# Patient Record
Sex: Male | Born: 1945 | Marital: Married | Smoking: Never smoker
Health system: Northeastern US, Academic
[De-identification: ages and names within clinical notes are randomized; demographics above are authoritative.]

## PROBLEM LIST (undated history)

## (undated) DIAGNOSIS — N400 Enlarged prostate without lower urinary tract symptoms: Secondary | ICD-10-CM

## (undated) DIAGNOSIS — M7580 Other shoulder lesions, unspecified shoulder: Secondary | ICD-10-CM

## (undated) DIAGNOSIS — K219 Gastro-esophageal reflux disease without esophagitis: Secondary | ICD-10-CM

## (undated) DIAGNOSIS — R634 Abnormal weight loss: Secondary | ICD-10-CM

## (undated) DIAGNOSIS — E162 Hypoglycemia, unspecified: Secondary | ICD-10-CM

## (undated) DIAGNOSIS — E785 Hyperlipidemia, unspecified: Secondary | ICD-10-CM

## (undated) DIAGNOSIS — R37 Sexual dysfunction, unspecified: Secondary | ICD-10-CM

## (undated) DIAGNOSIS — I10 Essential (primary) hypertension: Secondary | ICD-10-CM

## (undated) DIAGNOSIS — E119 Type 2 diabetes mellitus without complications: Secondary | ICD-10-CM

## (undated) HISTORY — DX: Abnormal weight loss: R63.4

## (undated) HISTORY — PX: LEG TENDON SURGERY: SHX1004

## (undated) HISTORY — PX: KNEE SURGERY: SHX244

## (undated) HISTORY — DX: Type 2 diabetes mellitus without complications: E11.9

---

## 2003-11-06 DIAGNOSIS — I10 Essential (primary) hypertension: Secondary | ICD-10-CM | POA: Insufficient documentation

## 2003-11-06 DIAGNOSIS — E785 Hyperlipidemia, unspecified: Secondary | ICD-10-CM | POA: Insufficient documentation

## 2003-11-06 DIAGNOSIS — K219 Gastro-esophageal reflux disease without esophagitis: Secondary | ICD-10-CM | POA: Insufficient documentation

## 2003-11-06 DIAGNOSIS — E162 Hypoglycemia, unspecified: Secondary | ICD-10-CM | POA: Insufficient documentation

## 2004-01-24 DIAGNOSIS — D179 Benign lipomatous neoplasm, unspecified: Secondary | ICD-10-CM | POA: Insufficient documentation

## 2004-02-13 LAB — HM COLONOSCOPY

## 2005-04-16 DIAGNOSIS — M719 Bursopathy, unspecified: Secondary | ICD-10-CM | POA: Insufficient documentation

## 2005-04-16 DIAGNOSIS — M67919 Unspecified disorder of synovium and tendon, unspecified shoulder: Secondary | ICD-10-CM | POA: Insufficient documentation

## 2005-04-16 DIAGNOSIS — N400 Enlarged prostate without lower urinary tract symptoms: Secondary | ICD-10-CM | POA: Insufficient documentation

## 2005-06-25 ENCOUNTER — Encounter: Payer: Self-pay | Admitting: Cardiology

## 2005-06-25 DIAGNOSIS — F529 Unspecified sexual dysfunction not due to a substance or known physiological condition: Secondary | ICD-10-CM | POA: Insufficient documentation

## 2006-12-03 ENCOUNTER — Encounter: Payer: Self-pay | Admitting: Cardiology

## 2009-06-11 ENCOUNTER — Ambulatory Visit: Payer: Self-pay | Admitting: Ophthalmology

## 2009-08-20 ENCOUNTER — Observation Stay
Admission: EM | Admit: 2009-08-20 | Disposition: A | Discharge status: Home / Self Care | Payer: Self-pay | Source: Ambulatory Visit | Attending: Emergency Medicine | Admitting: Emergency Medicine

## 2009-08-20 ENCOUNTER — Encounter: Payer: Self-pay | Admitting: Gastroenterology

## 2009-08-20 ENCOUNTER — Encounter: Payer: Self-pay | Admitting: Emergency Medicine

## 2009-08-20 ENCOUNTER — Other Ambulatory Visit: Payer: Self-pay | Admitting: Gastroenterology

## 2009-08-20 ENCOUNTER — Ambulatory Visit: Payer: Self-pay | Admitting: Internal Medicine

## 2009-08-20 HISTORY — DX: Gastro-esophageal reflux disease without esophagitis: K21.9

## 2009-08-20 HISTORY — DX: Hypoglycemia, unspecified: E16.2

## 2009-08-20 HISTORY — DX: Essential (primary) hypertension: I10

## 2009-08-20 HISTORY — DX: Sexual dysfunction, unspecified: R37

## 2009-08-20 HISTORY — DX: Other shoulder lesions, unspecified shoulder: M75.80

## 2009-08-20 HISTORY — DX: Benign prostatic hyperplasia without lower urinary tract symptoms: N40.0

## 2009-08-20 HISTORY — DX: Hyperlipidemia, unspecified: E78.5

## 2009-08-20 LAB — CBC AND DIFFERENTIAL
Baso # K/uL: 0.1 THOU/uL (ref 0.0–0.1)
Basophil %: 0.8 % (ref 0.2–1.2)
Eos # K/uL: 0.4 THOU/uL (ref 0.0–0.5)
Eosinophil %: 4.5 % (ref 0.8–7.0)
Hematocrit: 41 % (ref 40–51)
Hemoglobin: 13.5 g/dL — ABNORMAL LOW (ref 13.7–17.5)
Lymph # K/uL: 3 THOU/uL (ref 1.3–3.6)
Lymphocyte %: 31.1 % (ref 21.8–53.1)
MCV: 84 fL (ref 79–92)
Mono # K/uL: 0.8 THOU/uL (ref 0.3–0.8)
Monocyte %: 7.9 % (ref 5.3–12.2)
Neut # K/uL: 5.3 THOU/uL (ref 1.8–5.4)
Platelets: 222 THOU/uL (ref 150–330)
RBC: 4.9 MIL/uL (ref 4.6–6.1)
RDW: 14.5 % — ABNORMAL HIGH (ref 11.6–14.4)
Seg Neut %: 55.7 % (ref 34.0–67.9)
WBC: 9.5 THOU/uL — ABNORMAL HIGH (ref 4.2–9.1)

## 2009-08-20 LAB — URINALYSIS WITH REFLEX TO MICROSCOPIC
Blood,UA: NEGATIVE
Ketones, UA: NEGATIVE
Leuk Esterase,UA: NEGATIVE
Nitrite,UA: NEGATIVE
Protein,UA: NEGATIVE mg/dL
Specific Gravity,UA: 1.008 (ref 1.002–1.030)
pH,UA: 7.5 (ref 5.0–8.0)

## 2009-08-20 LAB — TROPONIN T
Troponin T: <0.01 ng/mL (ref 0.00–0.02)
Troponin T: <0.01 ng/mL (ref 0.00–0.02)

## 2009-08-20 LAB — PLASMA PROF 7 (ED ONLY)
Anion Gap,PL: 8 (ref 7–16)
CO2,Plasma: 28 mmol/L (ref 20–28)
Chloride,Plasma: 107 mmol/L (ref 96–108)
Creatinine: 1.17 mg/dL (ref 0.67–1.17)
GFR,Black: >59 *
GFR,Caucasian: >59 *
Glucose,Plasma: 86 mg/dL (ref 74–106)
Potassium,Plasma: 3.4 mmol/L (ref 3.4–4.7)
Sodium,Plasma: 143 mmol/L (ref 132–146)
UN,Plasma: 14 mg/dL (ref 6–20)

## 2009-08-20 LAB — CK ISOENZYMES
CK: 253 U/L — ABNORMAL HIGH (ref 46–171)
Mass CKMB: 3.5 ng/mL (ref 0.0–4.9)
Relative Index: 1.4 % (ref 0.0–5.0)

## 2009-08-20 MED ORDER — HYDRALAZINE HCL 20 MG/ML IJ SOLN *I*
10.0000 mg | Freq: Once | INTRAMUSCULAR | Status: AC
Start: 2009-08-20 — End: 2009-08-20
  Administered 2009-08-20: 10 mg via INTRAVENOUS
  Filled 2009-08-20: qty 1

## 2009-08-20 MED ORDER — CHOLECALCIFEROL 1000 UNIT PO CAPS *WRAPPED*
1000.0000 [IU] | ORAL_CAPSULE | Freq: Every day | ORAL | Status: DC
Start: 2009-08-21 — End: 2009-08-21
  Administered 2009-08-21: 1000 [IU] via ORAL
  Filled 2009-08-20: qty 1

## 2009-08-20 MED ORDER — TAMSULOSIN HCL 0.4 MG PO CAPS *I*
0.4000 mg | ORAL_CAPSULE | Freq: Every evening | ORAL | Status: DC
Start: 2009-08-20 — End: 2009-08-21
  Administered 2009-08-21: 0.4 mg via ORAL
  Filled 2009-08-20: qty 1

## 2009-08-20 MED ORDER — HYDROCHLOROTHIAZIDE 25 MG PO TABS *I*
12.5000 mg | ORAL_TABLET | Freq: Once | ORAL | Status: AC
Start: 2009-08-20 — End: 2009-08-21
  Administered 2009-08-21: 12.5 mg via ORAL
  Filled 2009-08-20: qty 1

## 2009-08-20 MED ORDER — SODIUM CHLORIDE 0.9 % INJ (FLUSH) WRAPPED *I*
3.0000 mL | Freq: Three times a day (TID) | Status: DC
Start: 2009-08-20 — End: 2009-08-21
  Administered 2009-08-20 – 2009-08-21 (×2): 3 mL via INTRAVENOUS

## 2009-08-20 MED ORDER — RANITIDINE HCL 150 MG PO TABS *I*
150.0000 mg | ORAL_TABLET | Freq: Two times a day (BID) | ORAL | Status: DC
Start: 2009-08-21 — End: 2009-08-21
  Administered 2009-08-21: 150 mg via ORAL
  Filled 2009-08-20 (×2): qty 1

## 2009-08-20 MED ORDER — LISINOPRIL 10 MG PO TABS *I*
10.0000 mg | ORAL_TABLET | Freq: Once | ORAL | Status: AC
Start: 2009-08-20 — End: 2009-08-21
  Administered 2009-08-21: 10 mg via ORAL
  Filled 2009-08-20: qty 1

## 2009-08-20 MED ORDER — ATORVASTATIN CALCIUM 10 MG PO TABS *I*
20.0000 mg | ORAL_TABLET | Freq: Every day | ORAL | Status: DC
Start: 2009-08-21 — End: 2009-08-21
  Administered 2009-08-21: 20 mg via ORAL
  Filled 2009-08-20: qty 2

## 2009-08-20 MED ORDER — LISINOPRIL 10 MG PO TABS *I*
10.0000 mg | ORAL_TABLET | Freq: Every day | ORAL | Status: DC
Start: 2009-08-21 — End: 2009-08-21
  Filled 2009-08-20: qty 1

## 2009-08-20 MED ORDER — ACETAMINOPHEN 325 MG PO TABS *I*
650.0000 mg | ORAL_TABLET | ORAL | Status: DC | PRN
Start: 2009-08-20 — End: 2009-08-21
  Administered 2009-08-21: 650 mg via ORAL
  Filled 2009-08-20: qty 2

## 2009-08-20 MED ORDER — HYDROCHLOROTHIAZIDE 25 MG PO TABS *I*
12.5000 mg | ORAL_TABLET | Freq: Every day | ORAL | Status: DC
Start: 2009-08-21 — End: 2009-08-21
  Filled 2009-08-20: qty 1

## 2009-08-20 MED ORDER — HYDRALAZINE HCL 20 MG/ML IJ SOLN *I*
INTRAMUSCULAR | Status: DC
Start: 2009-08-20 — End: 2009-08-21
  Filled 2009-08-20: qty 1

## 2009-08-20 MED ORDER — MORPHINE SULFATE 2 MG/ML IJ SOLN
2.0000 mg | Freq: Once | INTRAMUSCULAR | Status: AC
Start: 2009-08-20 — End: 2009-08-21
  Filled 2009-08-20: qty 1

## 2009-08-20 MED ORDER — HYDRALAZINE HCL 20 MG/ML IJ SOLN *I*
10.0000 mg | Freq: Once | INTRAMUSCULAR | Status: AC
Start: 2009-08-20 — End: 2009-08-20
  Administered 2009-08-20: 10 mg via INTRAVENOUS

## 2009-08-20 MED ORDER — ASPIRIN 81 MG PO CHEW *I*
324.0000 mg | CHEWABLE_TABLET | Freq: Once | ORAL | Status: AC
Start: 2009-08-20 — End: 2009-08-20
  Administered 2009-08-20: 324 mg via ORAL
  Filled 2009-08-20: qty 4

## 2009-08-20 NOTE — ED Notes (Signed)
Bed:PA-01<BR> Expected date:08/20/09<BR> Expected time: 4:41 PM<BR> Means of arrival:Hospital Transport<BR> Comments:<BR> ADULT SERVICE---  Tristan Mcbride---  MR [id number]---  [facility]---PT WITH &quot;SQUEEZING&quot; CHEST PAIN BP 190/110, SOB--EKG WITHOUT CHANGES--HAS HAD NO MEDS X 42YR---BP, CHOLESTEROL---PM

## 2009-08-20 NOTE — Progress Notes (Signed)
Reason For Visit   ZO:[id number]     MEDICATIONS/CHART REVIEWEDpatient is not taking any medications at this time     SUBJECTIVE:patient speaks only [place].  Interpreter phone and Spanish   interpreter used to conduct interview, exam and plan. patient states that   he has not taken any of his medications for approximately one year because   he does not have insurance to pay for them.  He has been experiencing   intermittent chest squeezing and shortness of breath that can last for   hours.  It can happen with exertion or at rest.  He denies any diaphoresis   or nausea.  He states that right now he is feeling like someone is   squeezing his chest and he is slightly short of breath.  when he   experiences these symptoms he cannot describe anything that relieves them   they go away on their own. patient denies cigarette smoking.     OBJECTIVE:BP 190/110  GENERAL:  AandOX3  LUNGS:  CTA  HEART:  RRR, normal S1, S2 no murmur  LE: no edema  EKG: no acute changes, reviewed, discussed and plan agreed upon with Dr.   Peter Minium     ASSESSMENT:hypertension, chest pain      PLAN:  1.  Lipitor 20 mg one tablet daily  2.  Ranitidine 150 mg one tablet twice daily  3.  To [facility]  ED for evaluation.  Active Problems   Benign Prostatic Hyperplasia (600.00)  CT Abd. 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  Current Meds   Ranitidine HCl 150 MG Tablet;TAKE 1 TABLET EVERY 12 HOURS DAILY.; Rx  Vitamin D 1000 UNIT Capsule;TAKE 1 CAPSULE DAILY; Rx  Lisinopril-Hydrochlorothiazide 10-12.5 MG Tablet;TAKE 1 TABLET DAILY.; Rx  Viagra 25 MG Tablet;TAKE 1 TABLET DAILY 1 HOUR BEFORE NEEDED; Rx  Tamsulosin HCl 0.4 MG Capsule;TAKE 1  CAPSULE DAILY; Rx  Lipitor 20 MG Tablet;TAKE 1 TABLET DAILY AT BEDTIME.; Rx.  Allergies   Latex-asked/denied  Tetanus Toxoid Adsorbed SOLN.  Latex Screen   --Patient denies an allergy to latex or rubber products.  --Patient denies a reaction such as hives, swelling, or difficulty   breathing after contact with rubber products.  --Patient denies that they are currently on latex precautions.  **If yes to any of the above, institute latex precautions.**.  Vital Signs   Recorded by jfarry on 20 Aug 2009 03:52 PM  Temp: 36.6 C,   Height: 67.75 in, Weight: 72 kg, BMI: 24.3 kg/m2,   Pain Scale: 0.  Health Mgmt Plan   Colonoscopy every 5 years; for HEALTH MAINTENANCE; Overdue  Complete Blood Count every 1 year; for HEALTH MAINTENANCE; Overdue  Influenza Vaccine every 1 year; for HEALTH MAINTENANCE  Lipid Profile every 1 year; for HEALTH MAINTENANCE; Overdue  Pneumococcal Polyvalent Vaccine (Pneumovax) every 5 years; for HEALTH   MAINTENANCE  Prostate Specific Antigen every 1 year; for HEALTH MAINTENANCE; Overdue  Td Immunization every 7 years; for HEALTH MAINTENANCE.  Results   Blood Pressure   20 Aug 2009 04:13 PM  -   Systolic: 190 mm Hg  -   Diastolic: 110 mm Hg.  Pulse   20 Aug 2009 04:13 PM  -   Heart Rate: 62 bpm.  Signature   Electronically signed by: Ernst Breach  N.P.; 08/20/2009 4:41 PM EST.

## 2009-08-20 NOTE — ED Obs Notes (Signed)
History   Chief Complaint   Patient presents with   . Chest Pain       HPI Comments: [name] is a 64 y/o male with PMHx HTN, hyperlipidemia, GERD, BPH, hypoglycemia is placed in OBS1 after being evaluated in the ED with chest pain. Pt states he began having transient right sided squeezing chest pain one month ago. Pt states the pain comes on randomly and is not associated with anything. Pt states he occasionally feels short of breath along with the chest pain, but denies any diaphoresis, radiation of pain, nausea, or vomiting. The pain usually last for 1 hour and randomly resolves. Pt was seeing his doctor today, he had chest pain was short of breath and had elevated BP, so he was instructed to come to the ED for further evaluation. Pt is currently chest pain free with no shortness of breath. Pt admits to being prescribed medicines but not taking them for one year due to lac of insurance. Pt denies fevers, chills, nausea, vomiting, diarrhea, LOC, numbness or tingling.    The history is provided by the patient. The history is limited by a language barrier. No language interpreter was used.       Past Medical History   Diagnosis Date   . BPH (benign prostatic hypertrophy)    . Kidney cyst    . Esophageal reflux    . Hypertension    . Hyperlipidemia    . Hypoglycemia    . Fibrolipoma      intramuscular adipose tissue   . Rotator cuff tendinitis      Right   . Sexual dysfunction      absent ejaculation         Past Surgical History   Procedure Date   . Leg tendon surgery      Right   . Knee surgery      left         Family History   Problem Relation Age of Onset   . Heart disease Mother    . Diabetes Mother    . Diabetes Father    . Stroke Father           reports that he has never smoked. He does not have any smokeless tobacco history on file.  He reports that he does not currently drink alcohol or use illicit drugs.    Review of Systems   Review of Systems   Constitutional: Negative for fever, chills and  diaphoresis.   HENT: Negative for sore throat, trouble swallowing, neck pain and neck stiffness.    Eyes: Negative for photophobia, pain and visual disturbance.   Respiratory: Positive for chest tightness and shortness of breath.    Cardiovascular: Positive for chest pain. Negative for palpitations and leg swelling.   Gastrointestinal: Negative for nausea, vomiting, abdominal pain and diarrhea.   Genitourinary: Positive for decreased urine volume and difficulty urinating. Negative for dysuria.   Musculoskeletal: Negative for myalgias, arthralgias and gait problem.   Skin: Negative for color change and wound.   Neurological: Positive for light-headedness. Negative for dizziness, seizures, syncope, weakness and numbness.   Psychiatric/Behavioral: Negative for behavioral problems, confusion, decreased concentration and agitation.       Physical Exam   BP 190/96  Pulse 76  Temp(Src) 36.8 C (98.2 F) (Oral)  Resp 18  SpO2 97%    Physical Exam   Constitutional: He is oriented to person, place, and time. He appears well-developed and well-nourished. No distress.  HENT:   Head: Normocephalic and atraumatic.   Eyes: Conjunctivae and EOM are normal. Right eye exhibits no discharge. Left eye exhibits no discharge. No scleral icterus.   Neck: Normal range of motion. Neck supple. No JVD present. No tracheal deviation present.   Cardiovascular: Normal rate, regular rhythm, normal heart sounds and intact distal pulses.    Pulmonary/Chest: Effort normal and breath sounds normal. No stridor. No respiratory distress. He has no wheezes. He has no rales. He exhibits no tenderness.   Abdominal: Soft. Bowel sounds are normal. He exhibits no distension. No tenderness.   Musculoskeletal: Normal range of motion. He exhibits no edema and no tenderness.   Lymphadenopathy:     He has no cervical adenopathy.   Neurological: He is alert and oriented to person, place, and time.   Skin: Skin is warm and dry. No rash noted. He is not  diaphoretic. No erythema.   Psychiatric: He has a normal mood and affect. His behavior is normal.       Medical Decision Making   MDM  Number of Diagnoses or Management Options  Diagnosis management comments: Patient seen by me on 08/20/2009 at 11:59 PM.    Assessment:  64 y.o., male with PMHx HTN, hyperlipidemia, hypoglycemia, GERD, BPH placed in OBS after evaluation in the ED for chest pain.  Differential Diagnosis includes ACS, unstable angina, GERD, musculoskeletal.  Plan: serial trops, labs in AM, pain management prn, tele monitoring, stress test most likely as an outpatient.              Gabriel Cirri, PA

## 2009-08-20 NOTE — ED Provider Notes (Addendum)
History   Chief Complaint   Patient presents with   . Chest Pain       Patient is a 64 y.o. male presenting with chest pain. The history is provided by the spouse, the patient and medical records. The history is limited by a language barrier (Pt speaks primarily spanish, but he converses well in [place] with slow questions). No language interpreter was used.   Chest Pain  The chest pain began more than 2 weeks ago (pt began to have intermittent squeezing chest pain about one month ago). Duration of episode(s) is 1 hour. Chest pain occurs intermittently. The chest pain is unchanged. Associated With: Associated with nothing, he says it is random. At its most intense, the pain is at 10/10. The pain is currently at 2/10. The severity of the pain is mild. The quality of the pain is described as squeezing. The pain does not radiate. Additional symptoms include shortness of breath and dizziness. Additional symptoms include no fever, no fatigue, no cough, no palpitations, no syncope, no abdominal pain, no nausea and no vomiting.   Dizziness does not occur with nausea, vomiting, weakness or diaphoresis.   Additional symptoms include near-syncope. Additional symptoms do not include diaphoresis, numbness, lower extremity edema or weakness. He tried antacids and proton pump inhibitors for the symptoms.   Risk factors for ischemic cardiac disease include hypertension, dyslipidemia, being male and a family history of coronary artery disease. Past medical history is positive for hypertension. Past medical history is negative for diabetes, past myocardial infarction or congestive heart failure. Procedure history is negative for cardiac catheterization, echocardiogram, stress echo or exercise treadmill test.       Past Medical History   Diagnosis Date   . BPH (benign prostatic hypertrophy)    . Kidney cyst    . Esophageal reflux    . Hypertension    . Hyperlipidemia    . Hypoglycemia    . Fibrolipoma      intramuscular adipose  tissue   . Rotator cuff tendinitis      Right   . Sexual dysfunction      absent ejaculation         Past Surgical History   Procedure Date   . Leg tendon surgery      Right   . Knee surgery      left         Family History   Problem Relation Age of Onset   . Heart disease Mother    . Diabetes Mother    . Diabetes Father    . Stroke Father           reports that he has never smoked. He does not have any smokeless tobacco history on file.  He reports that he does not currently drink alcohol or use illicit drugs.    Review of Systems   Review of Systems   Constitutional: Negative for fever, diaphoresis and fatigue.   HENT: Negative for congestion, neck pain and neck stiffness.    Eyes: Negative for visual disturbance.   Respiratory: Positive for shortness of breath. Negative for cough.    Cardiovascular: Positive for chest pain and near-syncope. Negative for palpitations and syncope.   Gastrointestinal: Negative for nausea, vomiting and abdominal pain.   Genitourinary: Negative for hematuria and flank pain.   Musculoskeletal: Negative for myalgias and arthralgias.   Skin: Negative for pallor and rash.   Neurological: Positive for dizziness. Negative for syncope, weakness and numbness.   Hematological:  Negative for adenopathy.   Psychiatric/Behavioral: Negative for confusion. The patient is not nervous/anxious.        Physical Exam   BP 198/115  Pulse 64  Temp(Src) 36.4 C (97.5 F) (Temporal)  Resp 15  SpO2 100%    Physical Exam   Nursing note and vitals reviewed.  Constitutional: He is oriented to person, place, and time. He appears well-developed. No distress.   HENT:   Head: Normocephalic and atraumatic.   Mouth/Throat: Oropharynx is clear and moist.   Eyes: Conjunctivae and EOM are normal. Pupils are equal, round, and reactive to light.   Neck: Normal range of motion. Neck supple. No JVD present.   Cardiovascular: Normal rate, regular rhythm and normal heart sounds.    No murmur heard.  Pulmonary/Chest: Effort  normal and breath sounds normal. No respiratory distress. He has no wheezes. He has no rales.   Abdominal: Soft. Bowel sounds are normal. He exhibits no distension. No tenderness. He has no guarding.   Genitourinary:        No CVA tenderness b/l   Musculoskeletal: Normal range of motion. He exhibits no edema and no tenderness.   Lymphadenopathy:     He has no cervical adenopathy.   Neurological: He is alert and oriented to person, place, and time. No cranial nerve deficit. He exhibits normal muscle tone. Coordination normal.   Skin: Skin is warm and dry. No rash noted. He is not diaphoretic. No erythema. No pallor.   Psychiatric: He has a normal mood and affect. His behavior is normal. Judgment and thought content normal.       Medical Decision Making   MDM  Number of Diagnoses or Management Options  Diagnosis management comments: Patient seen by me today, 08/20/2009 at 6:26 PM    Assessment:  64 y.o., male comes to the ED with chest pain, intermittent at random, squeezing, for past month.  Pt is followed for HTN by his doctor.  FamHx, age, gender, HTN, hyperlipids all concerning for ACS, or unstable angina.  Pt also with h/o GERD.  Differential Diagnosis includes ACS, unstable angina, GERD, stable angina, pericarditis.  Plan: EKG, CXR, CBC, ED-7, trop, telemetry, analgesia; pt likely to be CP R/O pt overnight, if no acute process discovered on workup.  Pt should have a stress test soon.             Amount and/or Complexity of Data Reviewed  Clinical lab tests: ordered  Tests in the radiology section of CPT: ordered  Decide to obtain previous medical records or to obtain history from someone other than the patient: yes  Obtain history from someone other than the patient: yes  Review and summarize past medical records: yes  Discuss the patient with other providers: yes  Independent visualization of images, tracings, or specimens: yes          Erasmo Leventhal, MD    Patient seen by me on arrival date of 08/20/2009 at  aprox 18:45    History:   I reviewed this patient, reviewed the resident note and agree, with corrections as below  Exam:   I examined this patient, reviewed the resident note and agree, with corrections as below    Decision Making:   I discussed with the documented resident decision making  and agree, with corrections as below    64 y.o. male who went in to PCP office today b/c out of htn med for some time and has been having intermittent retrosternal chest "tightness" without  radiation, occ associated with SOB, no associated nausea, vomiting, fever or chills.  Pt does not report association with activity.  Hx of same for many months.  Mentioned to PCP today and sent in.  Pt physical exam is unrevealing.  Will control htn, treat pain and admit to obs for full r/o and provocative testing.          Author Omarr Hann A Darrol Poke, MD  08/21/09 [phone]

## 2009-08-20 NOTE — ED Notes (Signed)
Sent form ac5.  C/O chest pain with elevated BP.  170/110.  Has not been on medications.  Hx  htn

## 2009-08-21 LAB — CBC AND DIFFERENTIAL
Baso # K/uL: 0.1 THOU/uL (ref 0.0–0.1)
Basophil %: 0.5 % (ref 0.2–1.2)
Eos # K/uL: 0.4 THOU/uL (ref 0.0–0.5)
Eosinophil %: 2.4 % (ref 0.8–7.0)
Hematocrit: 42 % (ref 40–51)
Hemoglobin: 14 g/dL (ref 13.7–17.5)
Lymph # K/uL: 2.8 THOU/uL (ref 1.3–3.6)
Lymphocyte %: 18.6 % — ABNORMAL LOW (ref 21.8–53.1)
MCV: 83 fL (ref 79–92)
Mono # K/uL: 0.9 THOU/uL — ABNORMAL HIGH (ref 0.3–0.8)
Monocyte %: 6.1 % (ref 5.3–12.2)
Neut # K/uL: 10.7 THOU/uL — ABNORMAL HIGH (ref 1.8–5.4)
Platelets: 208 THOU/uL (ref 150–330)
RBC: 5.1 MIL/uL (ref 4.6–6.1)
RDW: 14.7 % — ABNORMAL HIGH (ref 11.6–14.4)
Seg Neut %: 72.4 % — ABNORMAL HIGH (ref 34.0–67.9)
WBC: 14.8 THOU/uL — ABNORMAL HIGH (ref 4.2–9.1)

## 2009-08-21 LAB — TROPONIN T: Troponin T: <0.01 ng/mL (ref 0.00–0.02)

## 2009-08-21 LAB — BASIC METABOLIC PANEL
Anion Gap: 7 (ref 7–16)
CO2: 26 mmol/L (ref 20–28)
Calcium: 9.1 mg/dL (ref 8.6–10.2)
Chloride: 107 mmol/L (ref 96–108)
Creatinine: 1.2 mg/dL — ABNORMAL HIGH (ref 0.67–1.17)
GFR,Black: >59 *
GFR,Caucasian: >59 *
Glucose: 160 mg/dL — ABNORMAL HIGH (ref 74–106)
Lab: 15 mg/dL (ref 6–20)
Potassium: 3.6 mmol/L (ref 3.3–5.1)
Sodium: 140 mmol/L (ref 133–145)

## 2009-08-21 LAB — POCT GLUCOSE: Glucose POCT: 140 mg/dL — ABNORMAL HIGH (ref 74–106)

## 2009-08-21 MED ORDER — MOXIFLOXACIN HCL 400 MG PO TABS *I*
400.0000 mg | ORAL_TABLET | Freq: Every day | ORAL | Status: AC
Start: 2009-08-21 — End: 2009-08-31

## 2009-08-21 MED ORDER — MOXIFLOXACIN HCL 400 MG PO TABS *I*
400.0000 mg | ORAL_TABLET | Freq: Every day | ORAL | Status: DC
Start: 2009-08-21 — End: 2009-08-21
  Filled 2009-08-21: qty 1

## 2009-08-21 MED ORDER — HYDROCHLOROTHIAZIDE 12.5 MG PO TABS *I*
12.5000 mg | ORAL_TABLET | Freq: Every day | ORAL | Status: AC
Start: 2009-08-21 — End: 2009-09-20

## 2009-08-21 MED ORDER — LABETALOL HCL 20 MG/4 ML IV SOLN WRAPPED *I*
10.0000 mg | Freq: Once | INTRAVENOUS | Status: AC
Start: 2009-08-21 — End: 2009-08-21
  Administered 2009-08-21: 10 mg via INTRAVENOUS
  Filled 2009-08-21 (×2): qty 2

## 2009-08-21 MED ORDER — SODIUM CHLORIDE 0.9 % IV BOLUS *I*
1000.0000 mL | Freq: Once | Status: AC
Start: 2009-08-21 — End: 2009-08-21
  Administered 2009-08-21: 1000 mL via INTRAVENOUS

## 2009-08-21 MED ORDER — LISINOPRIL 10 MG PO TABS *I*
10.0000 mg | ORAL_TABLET | Freq: Every day | ORAL | Status: AC
Start: 2009-08-21 — End: 2009-09-20

## 2009-08-21 MED ORDER — SODIUM CHLORIDE 0.9 % IV SOLN WRAPPED *I*
100.0000 mL/h | Status: DC
Start: 2009-08-21 — End: 2009-08-21
  Administered 2009-08-21 (×2): 100 mL/h via INTRAVENOUS

## 2009-08-21 MED ORDER — HEPARIN SODIUM 5000 UNIT/ML SQ *I*
5000.0000 [IU] | Freq: Three times a day (TID) | SUBCUTANEOUS | Status: DC
Start: 2009-08-21 — End: 2009-08-21

## 2009-08-21 MED ORDER — MOXIFLOXACIN HCL IN NACL 400 MG/250ML IV SOLN *WRAPPED* *I*
400.0000 mg | Freq: Once | INTRAVENOUS | Status: AC
Start: 2009-08-21 — End: 2009-08-21
  Administered 2009-08-21: 400 mg via INTRAVENOUS
  Filled 2009-08-21: qty 250

## 2009-08-21 NOTE — ED Notes (Addendum)
Patient arrived to unit.  Patient stated that he wanted a translator whiclh was paged and is coming in.  Patient understands basic English.  Patient stated that he has a headache (pain at a 3) and tylenol was given.  Patient is resting at this time.

## 2009-08-21 NOTE — ED Notes (Signed)
Pt resting, denies pain 

## 2009-08-21 NOTE — ED Notes (Signed)
Patient is resting comfortably. 

## 2009-08-21 NOTE — Discharge Instructions (Signed)
Please follow-up with your PCP next week. 

## 2009-08-21 NOTE — Progress Notes (Signed)
Utilization Management    Level of Care Observation service as of the date 08/20/09      Ronica Vivian A Micahel Omlor     Pager: [phone]

## 2009-08-21 NOTE — ED Obs Notes (Signed)
Patient seen by me today, 08/21/2009 at 9:40 AM    Chief Complaint: Chief Complaint   Patient presents with   . Chest Pain       Subjective:  patient feels better. No current chest pain.  Nursing Pain Score:  Last Nursing documented pain:  0-10 Scale: 0 (08/21/09 0600)      Vitals: Patient Vitals in the past 24 hrs:   BP Temp Temp src Pulse Resp SpO2   08/21/09 0600 104/69 mmHg 36.2 C (97.2 F) TEMPORAL 84  16  98 %   08/21/09 0512 - 36.6 C (97.9 F) Tympanic - - -   08/21/09 0508 110/80 mmHg - - 78  - -   08/21/09 0450 84/58 mmHg - - 62  14  -   08/21/09 0336 140/100 mmHg - - - - -   08/21/09 0123 205/112 mmHg 37.2 C (99 F) TEMPORAL 87  18  97 %   08/21/09 0012 190/96 mmHg 36.8 C (98.2 F) Oral 76  18  97 %   08/21/09 0003 190/100 mmHg - - - - -   08/20/09 2315 178/100 mmHg 36.4 C (97.5 F) TEMPORAL 67  16  100 %   08/20/09 2134 198/115 mmHg - - 64  15  100 %   08/20/09 1926 180/87 mmHg - - 64  16  99 %   08/20/09 1755 225/106 mmHg 36.4 C (97.5 F) TEMPORAL 58  16  99 %   08/20/09 1716 190/110 mmHg 36.6 C (97.9 F) - 62  18  100 %         Physical Examination:  Physical Exam  Alert, oriented X 3 in NAD  CTA  RRR  Soft, nt, nd  No calf tenderness  Neuro is non-focal.    Lab Results: All labs in the last 72 hours   Recent Results (from the past 72 hour(s))   PLASMA PROF 7 [facility] ED ONLY)    Collection Time    08/20/09  5:47 PM   Component Value Range   . CHLORIDE,PLASMA 107  96-108 (mmol/L)   . CO2,PLASMA 28  20-28 (mmol/L)   . POTASSIUM,PLASMA 3.4  3.4-4.7 (mmol/L)   . SODIUM,PLASMA 143  132-146 (mmol/L)   . ANION GAP 8  7-16    . UREA NITROGEN,PLASMA 14  6-20 (mg/dL)   . CREATININE,PLASMA 1.17  0.67-1.17 (mg/dL)   . GFR,CAUCASIAN > 59  (*)   . GFR,BLACK > 59  (*)   . Glucose, Plasma 86  74-106 (mg/dL)   TROPONIN T    Collection Time    08/20/09  5:47 PM   Component Value Range   . Troponin T < 0.01  0.00-0.02 (ng/mL)   CK ISOENZYMES    Collection Time    08/20/09  5:47 PM   Component Value Range   . CK 253 (*)  46-171 (U/L)   . Mass CKMB 3.5  0.0-4.9 (ng/mL)   . Relative Index 1.4  0.0-5.0 (%)   CBC AND DIFFERENTIAL    Collection Time    08/20/09  5:47 PM   Component Value Range   . WBC 9.5 (*) 4.2-9.1 (THOU/uL)   . RBC 4.9  4.6-6.1 (MIL/uL)   . Hemoglobin 13.5 (*) 13.7-17.5 (g/dL)   . Hematocrit 41  40-51 (%)   . MCV 84  79-92 (fL)   . RDW 14.5 (*) 11.6-14.4 (%)   . Platelets 222  150-330 (THOU/uL)   . Seg Neut % 55.7  34.0-67.9 (%)   .  Lymphocyte % 31.1  21.8-53.1 (%)   . Monocyte % 7.9  5.3-12.2 (%)   . Eosinophil % 4.5  0.8-7.0 (%)   . Basophil % 0.8  0.2-1.2 (%)   . Neut # K/uL 5.3  1.8-5.4 (THOU/uL)   . Lymph # K/uL 3.0  1.3-3.6 (THOU/uL)   . Mono # K/uL 0.8  0.3-0.8 (THOU/uL)   . Eos # K/uL 0.4  0.0-0.5 (THOU/uL)   . Baso # K/uL 0.1  0.0-0.1 (THOU/uL)   URINALYSIS WITH REFLEX TO MICROSCOPIC    Collection Time    08/20/09  8:56 PM   Component Value Range   . Color, UA Lt Yellow  Yellow    . APPEARANCE,UR Clear  Clear    . Specific Gravity, UA 1.008  1.002-1.030    . Leuk Esterase,UA NEG  NEGATIVE    . Nitrite, UA NEG  NEGATIVE    . pH, Urine 7.5  5.0-8.0    . Protein, UA NEG  NEGATIVE (mg/dL)   . Glucose, Urine NORM  (mg/dL)   . Ketones, UA NEG  NEGATIVE    . Blood, UA NEG  NEGATIVE    TROPONIN T    Collection Time    08/20/09 11:28 PM   Component Value Range   . Troponin T < 0.01  0.00-0.02 (ng/mL)   POCT GLUCOSE    Collection Time    08/21/09  4:36 AM   Component Value Range   . Glucose, POC 140 (*) 74-106 (mg/dL)   TROPONIN T    Collection Time    08/21/09  4:56 AM   Component Value Range   . Troponin T < 0.01  0.00-0.02 (ng/mL)   CBC AND DIFFERENTIAL    Collection Time    08/21/09  4:56 AM   Component Value Range   . WBC 14.8 (*) 4.2-9.1 (THOU/uL)   . RBC 5.1  4.6-6.1 (MIL/uL)   . Hemoglobin 14.0  13.7-17.5 (g/dL)   . Hematocrit 42  40-51 (%)   . MCV 83  79-92 (fL)   . RDW 14.7 (*) 11.6-14.4 (%)   . Platelets 208  150-330 (THOU/uL)   . Seg Neut % 72.4 (*) 34.0-67.9 (%)   . Lymphocyte % 18.6 (*) 21.8-53.1 (%)   .  Monocyte % 6.1  5.3-12.2 (%)   . Eosinophil % 2.4  0.8-7.0 (%)   . Basophil % 0.5  0.2-1.2 (%)   . Neut # K/uL 10.7 (*) 1.8-5.4 (THOU/uL)   . Lymph # K/uL 2.8  1.3-3.6 (THOU/uL)   . Mono # K/uL 0.9 (*) 0.3-0.8 (THOU/uL)   . Eos # K/uL 0.4  0.0-0.5 (THOU/uL)   . Baso # K/uL 0.1  0.0-0.1 (THOU/uL)   BASIC METABOLIC PANEL    Collection Time    08/21/09  4:56 AM   Component Value Range   . Glucose 160 (*) 74-106 (mg/dL)   . Sodium 140  133-145 (mmol/L)   . Potassium 3.6  3.3-5.1 (mmol/L)   . Chloride 107  96-108 (mmol/L)   . CO2 26  20-28 (mmol/L)   . Anion Gap 7  7-16    . BUN 15  6-20 (mg/dL)   . Creatinine 1.20 (*) 0.67-1.17 (mg/dL)   . GFR MDRD Non Af Amer > 59  (*)   . GFR MDRD Af Amer > 59  (*)   . Calcium 9.1  8.6-10.2 (mg/dL)   BLOOD CULTURE    Collection Time    08/21/09  5:29 AM   Component  Value Range   . Blood Culture, Routine .     BLOOD CULTURE    Collection Time    08/21/09  6:22 AM   Component Value Range   . Blood Culture, Routine .               Imaging findings: Chest Frontal & Lat    08/20/2009  IMPRESSION: Slight retrocardiac density in the right lower lung  concerning for focal infiltrate.   Dr. Burna Cash was made aware of the above finding at 6:45 pm via  phone at 08/20/2009.         Assessment: This is a  64 years old with history of HTN, HLD, hypoglycemia, transferred from medicine clinic at [facility] secondary to chest pressure and hypertensive urgency due to medical non-compliance (pt had run out of medications).  This patient received two IV dosis of 10 mg of hydralazine and 10 mg of labetalol IV. He had an episode of hypotension this AM that responded to IVF. Feels better now.  Will hold off his HCTZ and lisinopril for now. I have d/c the IVF. Will continue to rule out for ACS. If his BP stabilizes by this afternoon  And he rules out, will d/c home to follow up with PCP next week.       Plan: as stated above.  Medically preferred DVT prophylaxis: Unfractionated Heparin TID    Author: Kinlie Janice   Note created: 08/21/2009  at: 9:39 AM

## 2009-08-21 NOTE — ED Notes (Signed)
At 0441, patient's HR dropped to 38.  Patient was sweating and stating that he was not feeling well.  Provider PM was present at the time and ordered a bolus of NS.  BP was 84/58 and BG was 140.  Patient is now resting at this time

## 2009-08-22 LAB — EKG 12-LEAD
P: 47 degrees
PR: 162 ms
QRS: 13 degrees
QRSD: 81 ms
QT: 401 ms
QTc: 417 ms
Rate: 65 {beats}/min
Severity: BORDERLINE
Statement: NORMAL
T: 35 degrees

## 2009-08-23 LAB — EKG 12-LEAD
P: 49 degrees
P: 66 degrees
PR: 172 ms
PR: 156 ms
QRS: -1 degrees
QRS: 17 degrees
QRSD: 84 ms
QRSD: 84 ms
QT: 460 ms
QT: 488 ms
QTc: 456 ms
QTc: 441 ms
Rate: 59 {beats}/min
Rate: 49 {beats}/min
Severity: ABNORMAL
Severity: ABNORMAL
T: 17 degrees
T: 37 degrees

## 2009-08-26 LAB — BLOOD CULTURE
Bacterial Blood Culture: 0
Bacterial Blood Culture: 0

## 2009-09-02 ENCOUNTER — Ambulatory Visit: Payer: Self-pay | Admitting: Internal Medicine

## 2009-09-02 NOTE — Progress Notes (Signed)
Reason For Visit   ZO:[id number] from ED from chest pain and hypertension     MEDICATIONS/CHART REVIEWEDpatient is taking only lisinopril at this time 10   mg daily     SUBJECTIVE:patient is here today for a followup visit after being seen in   the emergency department for chest pain and hypertension.  He states all of   his tests were negative.  He was discharged on lisinopril 10 mg one tablet   daily.  Patient states he is feeling well he denies any shortness of breath   or chest pain at this time.     OBJECTIVE:  GENERAL:  AandOX3  LUNGS:  CTA  HEART:  RRR   SKIN:      no rashes     ASSESSMENT:hypertension much better     PLAN:  1.  No change in medications at this time  2.  Office visit with PCP in 3 months  3.  Call if any problems when necessary.  Active Problems   Benign Prostatic Hyperplasia (600.00)  CT Abd. 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  Current Meds   Vitamin D 1000 UNIT Capsule;TAKE 1 CAPSULE DAILY; Rx  Tamsulosin HCl 0.4 MG Capsule;TAKE 1 CAPSULE DAILY; Rx  Lipitor 20 MG Tablet;TAKE 1 TABLET DAILY AT BEDTIME.; Rx  Ranitidine HCl 150 MG Tablet;TAKE 1 TABLET EVERY 12 HOURS DAILY.; Rx  Lisinopril-Hydrochlorothiazide 10-12.5 MG Tablet;TAKE 1 TABLET DAILY.; Rx  Viagra 25 MG Tablet;TAKE 1 TABLET DAILY 1 HOUR BEFORE NEEDED; Rx.  Allergies   Latex-asked/denied  Tetanus Toxoid Adsorbed SOLN.  Latex Screen   --Patient denies an allergy to latex or rubber products.  --Patient denies a reaction such as hives, swelling, or difficulty   breathing after contact with rubber products.  --Patient denies that they are currently on latex precautions.  **If yes to any of the above, institute latex  precautions.**.  Vital Signs   Recorded by ctorres on 02 Sep 2009 03:51 PM  Temp: 36.7 C,   Weight: 71.8 kg,   Pain Scale: 0.  Recorded by ctorres on 02 Sep 2009 03:52 PM  Height: 67.75 in.  Health Mgmt Plan   Colonoscopy every 5 years; for HEALTH MAINTENANCE; Overdue  Complete Blood Count every 1 year; for HEALTH MAINTENANCE; Overdue  Influenza Vaccine every 1 year; for HEALTH MAINTENANCE; Near Due  Lipid Profile every 1 year; for HEALTH MAINTENANCE; Overdue  Pneumococcal Polyvalent Vaccine (Pneumovax) every 5 years; for HEALTH   MAINTENANCE  Prostate Specific Antigen every 1 year; for HEALTH MAINTENANCE; Overdue  Td Immunization every 7 years; for HEALTH MAINTENANCE.  Signature   Electronically signed by: Ernst Breach  N.P.; 09/02/2009 5:34 PM EST.

## 2009-10-15 ENCOUNTER — Ambulatory Visit: Payer: Self-pay | Admitting: Internal Medicine

## 2009-10-17 ENCOUNTER — Ambulatory Visit
Admit: 2009-10-17 | Discharge: 2009-10-17 | Disposition: A | Discharge status: Home / Self Care | Payer: Self-pay | Source: Ambulatory Visit | Attending: Internal Medicine | Admitting: Internal Medicine

## 2009-10-17 LAB — CBC
Hematocrit: 43 % (ref 40–51)
Hemoglobin: 14.1 g/dL (ref 13.7–17.5)
MCV: 84 fL (ref 79–92)
Platelets: 277 THOU/uL (ref 150–330)
RBC: 5.2 MIL/uL (ref 4.6–6.1)
RDW: 14.3 % (ref 11.6–14.4)
WBC: 10 THOU/uL — ABNORMAL HIGH (ref 4.2–9.1)

## 2009-10-17 LAB — CK: CK: 142 U/L (ref 46–171)

## 2009-10-17 LAB — COMPREHENSIVE METABOLIC PANEL
ALT: 20 U/L (ref 0–50)
AST: 19 U/L (ref 0–50)
Albumin: 4.4 g/dL (ref 3.5–5.2)
Alk Phos: 82 U/L (ref 40–130)
Anion Gap: 10 (ref 7–16)
Bilirubin,Total: 0.3 mg/dL (ref 0.0–1.2)
CO2: 26 mmol/L (ref 20–28)
Calcium: 9.5 mg/dL (ref 8.6–10.2)
Chloride: 102 mmol/L (ref 96–108)
Creatinine: 1.32 mg/dL — ABNORMAL HIGH (ref 0.67–1.17)
GFR,Black: >59 *
GFR,Caucasian: 55 * — AB
Glucose: 125 mg/dL — ABNORMAL HIGH (ref 74–106)
Lab: 22 mg/dL — ABNORMAL HIGH (ref 6–20)
Potassium: 3.9 mmol/L (ref 3.3–5.1)
Sodium: 138 mmol/L (ref 133–145)
Total Protein: 7.2 g/dL (ref 6.3–7.7)

## 2009-10-17 LAB — LIPID PANEL
Chol/HDL Ratio: 3.9
Cholesterol: 213 mg/dL — AB
HDL: 55 mg/dL
LDL Calculated: 141 mg/dL
Non HDL Cholesterol: 158 mg/dL
Triglycerides: 83 mg/dL

## 2009-10-22 ENCOUNTER — Ambulatory Visit: Payer: Self-pay | Admitting: Internal Medicine

## 2009-10-22 NOTE — Progress Notes (Signed)
Reason For Visit   YQ:[id number], lab results      MEDICATIONS/CHART REVIEWED done at the time of visit     SUBJECTIVE:patient is here today for a followup blood pressure check.  He   is also requesting medication for acid reflux.  He was unable to count his   last prescription filled due to finances.  He denies any chest pain,   shortness of breath or lower extremity edema.     OBJECTIVE:  GENERAL:  AandOX3  LUNGS:  CTA  HEART:  RRR   ABD:  positive bowel sounds, soft, nontender, nondistended, no rebound, no   guarding  reviewed recent labs with patient     ASSESSMENT:hypertension, GERD, Hyperlipidemia     PLAN:  1.  No change in blood pressure medication at this time  2.  Omeprazole 20 mg one tablet daily  3.  Encouraged patient to follow low cholesterol/salt diet  4.  Office visit in 3 months for routine followup.  Active Problems   Benign Prostatic Hyperplasia (600.00)  CT Abd. 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  Current Meds   Lisinopril 10 MG Tablet;TAKE 1 TABLET DAILY.; Rx.  Allergies   Latex-asked/denied  Tetanus Toxoid Adsorbed SOLN.  Latex Screen   --Patient denies an allergy to latex or rubber products.  --Patient denies a reaction such as hives, swelling, or difficulty   breathing after contact with rubber products.  --Patient denies that they are currently on latex precautions.  **If yes to any of the above, institute latex precautions.**.  Vital Signs   Recorded by [email or web address] on 22 Oct 2009 03:59 PM  BP:115/80,   HR: 74 b/min,   Temp: 36.6 C,   Height: 67.75 in, Weight: 71.3 kg, BMI: 24.1 kg/m2,   Pain Scale: 0.  Health Mgmt Plan   Colonoscopy every 5 years; for HEALTH MAINTENANCE;  Overdue  Complete Blood Count every 1 year; for HEALTH MAINTENANCE; Overdue  Influenza Vaccine every 1 year; for HEALTH MAINTENANCE; Near Due  Lipid Profile every 1 year; for HEALTH MAINTENANCE; Overdue  Pneumococcal Polyvalent Vaccine (Pneumovax) every 5 years; for HEALTH   MAINTENANCE  Prostate Specific Antigen every 1 year; for HEALTH MAINTENANCE; Overdue  Td Immunization every 7 years; for HEALTH MAINTENANCE.  Signature   Electronically signed by: Ernst Breach  N.P.; 10/22/2009 4:36 PM EST.

## 2010-01-23 ENCOUNTER — Ambulatory Visit: Payer: Self-pay | Admitting: Internal Medicine

## 2010-01-25 NOTE — Progress Notes (Addendum)
 Reason For Visit   Difficulty urinating.  HPI   64 year old male with history of BPH.  He is here with complaint of   difficulty urinating.  He previously was without insurance but now has   insurance.     Patient stopped Flomax 0.8 mg each bedtime one year ago when his insurance   was discontinued and he could no longer afford this.  He did have an   elevated PSA to 4.6 and was referred to urology and had a needle biopsy   performed which showed no malignant cells.  He states that he would like to   continue to monitor for prostate cancer.     Rash: Noted to have rash on exam.  He states this has been present for   years.  It does not bother him.  Active Problems   Benign Prostatic Hyperplasia (600.00)  CT Abd. 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  Current Meds   Lisinopril 10 MG Tablet;TAKE 1 TABLET DAILY.; Rx  Omeprazole 20 MG Capsule Delayed Release;TAKE 1 CAPSULE DAILY.; Rx.  Allergies   Latex-asked/denied  Tetanus Toxoid Adsorbed SOLN.  Latex Screen   --Patient denies an allergy to latex or rubber products.  --Patient denies a reaction such as hives, swelling, or difficulty   breathing after contact with rubber products.  --Patient denies that they are currently on latex precautions.  **If yes to any of the above, institute latex precautions.**.  Vital Signs   Recorded by jfarry on 23 Jan 2010 04:09 PM  Temp: 36.4 C,   Height: 66.5 in, Weight: 71.8 kg, BMI: 25.2 kg/m2,   Pain Scale: 0.  Recorded by jfarry on 23 Jan 2010 04:11 PM  BP:142/86,   HR: 80 b/min.  Physical Exam   General -- alert, no acute distress  HEENT -- moist mucous membranes without oral lesions  cardiovascular -- regular rate  and rhythm without rubs murmurs or gallops  pulmonary -- clear to auscultation bilaterally  abdomen -- soft, nontender nondistended, no hepatosplenomegaly, positive   bowel sounds  extremities -- no calf swelling tenderness or edema.  Skin-multiple well-circumscribed macular hypopigmented scaly lesions on   neck.  Health Mgmt Plan   Colonoscopy every 5 years; for HEALTH MAINTENANCE; Overdue  Complete Blood Count every 1 year; for HEALTH MAINTENANCE; Overdue  Influenza Vaccine every 1 year; for HEALTH MAINTENANCE; Overdue  Lipid Profile every 1 year; for HEALTH MAINTENANCE; Overdue  Pneumococcal Polyvalent Vaccine (Pneumovax) every 5 years; for HEALTH   MAINTENANCE  Prostate Specific Antigen every 1 year; for HEALTH MAINTENANCE; Overdue  Td Immunization every 7 years; for HEALTH MAINTENANCE.  Assessment   64 year old gentleman with BPH, and continued versicolor.     BPH: Will restart Flomax.  We'll start at 0.4 mg each bedtime and titrate   up to 0.8 mg each bedtime after 2 weeks if patient tolerates this well.     Patient does also desire to continue to screen for prostate cancer.  We   will check a PSA today.     Tinea versicolor: We'll treat with fluconazole 200 mg daily for 2 weeks.     Preventative health maintenance: Flu shot given today.     ** Medication reconciliation completed and updated list printed  for the   patient. **     Health history update reviewed.     Patient expresses understanding and agreement with the above plan.  He will   followup in 6 months time or sooner if needed.  Attestation                   --Case discussed with resident at the time of the visit.  I agree with the   findings and plan of care as documented by the resident regarding BPH and   tinea versicolor.         [name], M.D., Attending physician, Internal Medicine   [facility]       01/25/2010.  Signature   Electronically signed by: Saddie Benders  MD - Res.; 01/23/2010 5:49 PM EST.  Electronically signed by:  [name]  M.D.; 01/25/2010 5:51 PM EST.

## 2010-02-01 ENCOUNTER — Ambulatory Visit
Admit: 2010-02-01 | Discharge: 2010-02-01 | Disposition: A | Discharge status: Home / Self Care | Payer: Self-pay | Source: Ambulatory Visit | Attending: Internal Medicine | Admitting: Internal Medicine

## 2010-02-01 LAB — PSA (EFF.4-2010): PSA (eff. 4-2010): 8.85 ng/mL — ABNORMAL HIGH (ref 0.00–4.00)

## 2010-04-20 NOTE — Miscellaneous (Unsigned)
 Continuity of Care Record  Created: todo  From: [name]  From:   From: [employer], EHR v10.2.7.53  To: Ryder, Kylo  Purpose: Patient Use;       Problems  Diagnosis: Benign Prostatic Hyperplasia (600.00)   Diagnosis: Benign Prostatic Hypertrophy With Urinary Obstruction (600.01)   Problem: CT Abd. 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 ; Rx   Omeprazole 20 MG Capsule Delayed Release; TAKE 1 CAPSULE DAILY. ; Rx   Tamsulosin HCl 0.4 MG Capsule; TAKE 1 CAPSULE BY MOUTH AT BEDTIME NIGHTLY ;   Rx   Triamcinolone Acetonide 0.1 % Ointment; Apply to affected areas twice a day   ; Rx     Immunizations  * Influenza   * Influenza   * Influenza   * Influenza

## 2010-04-24 ENCOUNTER — Ambulatory Visit: Payer: Self-pay | Admitting: Internal Medicine

## 2010-06-05 ENCOUNTER — Ambulatory Visit
Admit: 2010-06-05 | Discharge: 2010-06-05 | Disposition: A | Discharge status: Home / Self Care | Payer: Self-pay | Admitting: Urology

## 2010-06-05 ENCOUNTER — Encounter: Payer: Self-pay | Admitting: Urology

## 2010-06-05 DIAGNOSIS — R972 Elevated prostate specific antigen [PSA]: Secondary | ICD-10-CM | POA: Insufficient documentation

## 2010-06-05 LAB — PSA (EFF.4-2010): PSA (eff. 4-2010): 6.1 ng/mL — ABNORMAL HIGH (ref 0.00–4.00)

## 2010-06-05 NOTE — Progress Notes (Signed)
 Urology Clinic Note   Dear Dr. [name]:   [name] is a 65 year old male being seen in follow-up for the   following:elevated PSA.     This patient was recently noted to have elevated PSA to 8.85.  His last PSA   was 4.8 last year.  He does have history of BPH and elevated PSA.  He had   prostate biopsy in 2010.  There was no evidence of malignancy.  He was   given tamsulosin.  He discontinued this.  His voiding symptoms   deteriorated.  He was recently replaced back on tamsulosin.  He is voiding   much better.  He denied urinary tract infections.  He denies hematuria.     No other significant changes are noted in his health, review of systems or   medications.     Physical examination of the abdomen is benign. There is no evidence of a   mass.  He does not have CVA tenderness.  There is no evidence of   organomegaly.  I did not notice any lymphadenopathy.  There is no evidence   of suprapubic fullness or tenderness.  There was no evidence of hernia.    Genitalia reveals a normal phallus with normal testes.  Rectal examination   reveals a good anal tone.  Prostate is approximately 40-50g and smooth   without nodularity.  This is not suspicious for malignancy.      Assessment/plan: Elevated PSA.  His PSA will be repeated.  He may require   repeat biopsy.  He will continue tamsulosin as directed.     Sincerely,     Oran Rein M.D.     Transcribed by Reubin Milan voice recognition system.  Allergies   Latex-asked/denied  Tetanus Toxoid Adsorbed SOLN.  Current Meds   Omeprazole 20 MG Capsule Delayed Release;TAKE 1 CAPSULE DAILY.; Rx  Tamsulosin HCl 0.4 MG Capsule;Take one capsule by mouth every night for 2   weeks, then increase to 2 tabs by mouth every night.; Rx  Tamsulosin HCl 0.4 MG Capsule;TAKE 1 CAPSULE BY MOUTH AT BEDTIME NIGHTLY; Rx  Lisinopril 10 MG Tablet;TAKE 1 TABLET DAILY.; Rx  Fluconazole 200 MG Tablet;TAKE 1 TABLET DAILY.; Rx.  Active Problems   Benign Prostatic Hyperplasia (600.00)  CT Abd.  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  Vital Signs   Recorded by [facility] on 05 Jun 2010 09:52 AM  BP:138/76,   HR: 74 b/min,   Temp: 36.6 C,  Tympanic,   Pain Scale: 0.  Results   U/A DIP (CHEMSTRIP 10 + SSA)   05 Jun 2010 10:03 AM  -   PH,UR: 7.5   -   LEUK ESTERASE,UR: -  -   NITRITES,UR: -  -   PROTEIN,UR: trace  -   GLUCOSE,UR: -  -   Alphonsus Sias: -  -   BLOOD,UR: -.  Signature   Electronically signed by: Oran Rein  M.D.; 06/05/2010 10:16 AM EST.

## 2010-06-12 ENCOUNTER — Encounter: Payer: Self-pay | Admitting: Urology

## 2010-06-12 ENCOUNTER — Ambulatory Visit
Admit: 2010-06-12 | Discharge: 2010-06-12 | Disposition: A | Discharge status: Home / Self Care | Payer: Self-pay | Admitting: Urology

## 2010-06-12 NOTE — Progress Notes (Signed)
 Urology Clinic Note   Dear Dr. [name]:   [name] is a 65 year old male being seen in follow-up for the   following:elevated PSA.     This patient was noted to have an elevated PSA to 8.8 approximately 3   months ago.  He has been biopsied in the past.  He had a repeat PSA   recently which was reduced to 6.1.  He is doing well.  He is voiding well.    He is maintained on tamsulosin.     No other significant changes are noted in his health, review of systems or   medications.     Assessment/plan: Elevated PSA with a downward trend.  Findings options were   reviewed.  He does understand again he may require repeat biopsy.  He   elected to defer this at this time.  PSA will be repeated in 3 months.  His   daughter-in-law was here translating for him today. Approximately 10   minutes her sputum today.     Sincerely,     Oran Rein M.D.     Transcribed by Reubin Milan voice recognition system.  Allergies   Latex-asked/denied  Tetanus Toxoid Adsorbed SOLN.  Current Meds   Tamsulosin HCl 0.4 MG Capsule;TAKE 1 CAPSULE BY MOUTH AT BEDTIME NIGHTLY; Rx  Lisinopril 10 MG Tablet;TAKE 1 TABLET DAILY.; Rx  Fluconazole 200 MG Tablet;TAKE 1 TABLET DAILY.; Rx  Tamsulosin HCl 0.4 MG Capsule;Take one capsule by mouth every night for 2   weeks, then increase to 2 tabs by mouth every night.; Rx  Omeprazole 20 MG Capsule Delayed Release;TAKE 1 CAPSULE DAILY.; Rx.  Active Problems   Benign Prostatic Hyperplasia (600.00)  Benign Prostatic Hypertrophy With Urinary Obstruction (600.01)  CT Abd. 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  Vital Signs   Recorded by  [facility] on 12 Jun 2010 10:50 AM  BP:160/85,   HR: 84 b/min,   Temp: 36.9 C,  Tympanic,   Pain Scale: 0.  Results   U/A DIP (CHEMSTRIP 10 + SSA)   12 Jun 2010 10:58 AM  -   PH,UR: 6   -   LEUK ESTERASE,UR: -  -   NITRITES,UR: -  -   PROTEIN,UR: -  -   GLUCOSE,UR: -  -   KETONES,UR: -  -   BLOOD,UR: trace.  Signature   Electronically signed by: Oran Rein  M.D.; 06/12/2010 11:08 AM EST.

## 2010-06-19 ENCOUNTER — Ambulatory Visit: Payer: Self-pay | Admitting: Internal Medicine

## 2010-06-19 ENCOUNTER — Encounter: Payer: Self-pay | Admitting: Internal Medicine

## 2010-06-20 NOTE — Progress Notes (Addendum)
 Reason For Visit   Rash on legs.  HPI   Itchy spots on leg that are itchy and bleed when he scratches them. This   started a couple months ago, and now has more spots on both legs. No   changes in soaps, detergents. No one else in house with naythign like this.  Active Problems   Benign Prostatic Hyperplasia (600.00)  Benign Prostatic Hypertrophy With Urinary Obstruction (600.01)  CT Abd. 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  Current Meds   Tamsulosin HCl 0.4 MG Capsule;Take one capsule by mouth every night for 2   weeks, then increase to 2 tabs by mouth every night.; Rx  Fluconazole 200 MG Tablet;TAKE 1 TABLET DAILY.; Rx  Tamsulosin HCl 0.4 MG Capsule;TAKE 1 CAPSULE BY MOUTH AT BEDTIME NIGHTLY; Rx  Omeprazole 20 MG Capsule Delayed Release;TAKE 1 CAPSULE DAILY.; Rx  Lisinopril 10 MG Tablet;TAKE 1 TABLET DAILY.; Rx.  Allergies   Latex-asked/denied  Tetanus Toxoid Adsorbed SOLN.  Latex Screen   --Patient denies an allergy to latex or rubber products.  --Patient denies a reaction such as hives, swelling, or difficulty   breathing after contact with rubber products.  --Patient denies that they are currently on latex precautions.  **If yes to any of the above, institute latex precautions.**.  Vital Signs   Recorded by jfarry on 19 Jun 2010 03:52 PM  BP:110/70,   HR: 92 b/min,   Temp: 36.9 C,   Weight: 77.3 kg,   Pain Scale: 0.  Physical Exam   Gen- Alert, NAD  CV- RRR, no R/M/G  Pulm- CTA bilaterally  Ext- 2mm erythematous paplular lesions over anterior shins, also 1cm   macular lesions with overlying excoriations.  Health Mgmt Plan   Colonoscopy  every 5 years; for HEALTH MAINTENANCE; Overdue  Complete Blood Count every 1 year; for HEALTH MAINTENANCE; Overdue  Influenza Vaccine every 1 year; for HEALTH MAINTENANCE; Overdue  Lipid Profile every 1 year; for HEALTH MAINTENANCE; Overdue  Pneumococcal Polyvalent Vaccine (Pneumovax) every 5 years; for HEALTH   MAINTENANCE  Prostate Specific Antigen every 1 year; for HEALTH MAINTENANCE; Overdue  Td Immunization every 7 years; for HEALTH MAINTENANCE.  Assessment   Likely insect bites with secondary post-inflammatory hyperpigmentation.      Will treat with triamcinolone 0.1% BID for 2-4 weeks. Call clinic if   worsens.  Attestation       --I saw and evaluated the patient. I agree with the resident's findings and   plan of care as documented above.  Details of my evaluation are as follows:   rash is as described by resident. Will initiate a trial of an intermediate   strength steroid and observe results.                     [name], M.D., Attending physician, Internal Medicine   [facility]       06/22/2010.  Signature   Electronically signed by: Saddie Benders  MD - Res.; 06/20/2010 3:32 PM EST.  Electronically signed by: [name]  M.D.; 06/22/2010 6:00 PM EST.

## 2010-06-30 ENCOUNTER — Ambulatory Visit: Payer: Self-pay

## 2010-07-24 ENCOUNTER — Ambulatory Visit: Payer: Self-pay | Admitting: Internal Medicine

## 2010-08-01 ENCOUNTER — Ambulatory Visit
Admit: 2010-08-01 | Discharge: 2010-08-01 | Disposition: A | Discharge status: Home / Self Care | Payer: Self-pay | Source: Ambulatory Visit | Attending: Urology | Admitting: Urology

## 2010-08-01 LAB — PSA (EFF.4-2010): PSA (eff. 4-2010): 5.83 ng/mL — ABNORMAL HIGH (ref 0.00–4.00)

## 2010-08-05 ENCOUNTER — Ambulatory Visit: Payer: Self-pay | Admitting: Ophthalmology

## 2010-09-04 ENCOUNTER — Ambulatory Visit
Admit: 2010-09-04 | Discharge: 2010-09-04 | Disposition: A | Discharge status: Home / Self Care | Payer: Self-pay | Source: Ambulatory Visit | Attending: Urology | Admitting: Urology

## 2010-09-04 LAB — PSA (EFF.4-2010): PSA (eff. 4-2010): 7.77 ng/mL — ABNORMAL HIGH (ref 0.00–4.00)

## 2010-09-05 ENCOUNTER — Other Ambulatory Visit: Payer: Self-pay | Admitting: Urology

## 2010-09-05 ENCOUNTER — Telehealth: Payer: Self-pay | Admitting: Urology

## 2010-09-05 DIAGNOSIS — R972 Elevated prostate specific antigen [PSA]: Secondary | ICD-10-CM

## 2010-09-05 NOTE — Telephone Encounter (Signed)
 elavated PSA

## 2010-09-11 ENCOUNTER — Encounter: Payer: Self-pay | Admitting: Urology

## 2010-09-11 ENCOUNTER — Ambulatory Visit: Payer: Self-pay | Admitting: Internal Medicine

## 2010-09-11 ENCOUNTER — Ambulatory Visit: Payer: Self-pay | Admitting: Urology

## 2010-09-11 ENCOUNTER — Encounter: Payer: Self-pay | Admitting: Internal Medicine

## 2010-09-11 VITALS — BP 124/80 | HR 60 | Temp 97.7°F | Ht 68.0 in | Wt 169.5 lb

## 2010-09-11 VITALS — BP 134/79 | HR 61 | Temp 97.9°F | Ht 68.0 in | Wt 159.0 lb

## 2010-09-11 DIAGNOSIS — M1990 Unspecified osteoarthritis, unspecified site: Secondary | ICD-10-CM

## 2010-09-11 DIAGNOSIS — Z0000 Encounter for general adult medical examination without abnormal findings: Secondary | ICD-10-CM

## 2010-09-11 DIAGNOSIS — I10 Essential (primary) hypertension: Secondary | ICD-10-CM

## 2010-09-11 DIAGNOSIS — R972 Elevated prostate specific antigen [PSA]: Secondary | ICD-10-CM

## 2010-09-11 DIAGNOSIS — M172 Bilateral post-traumatic osteoarthritis of knee: Secondary | ICD-10-CM

## 2010-09-11 DIAGNOSIS — Z1389 Encounter for screening for other disorder: Secondary | ICD-10-CM

## 2010-09-11 DIAGNOSIS — E785 Hyperlipidemia, unspecified: Secondary | ICD-10-CM

## 2010-09-11 LAB — POCT SIEMENS URINALYSIS DIPSTICK
Blood,UA POCT: NEGATIVE
Glucose,UA POCT: NEGATIVE
Ketones,UA POCT: NEGATIVE
Kit Lot: 112075
LEUKOCYTES,POC: NEGATIVE
Nitrite,UA POCT: NEGATIVE
pH,UA POCT: 6

## 2010-09-11 MED ORDER — NAPROXEN 250 MG PO TABS *I*
250.0000 mg | ORAL_TABLET | Freq: Two times a day (BID) | ORAL | Status: DC
Start: 2010-09-11 — End: 2011-12-03

## 2010-09-11 MED ORDER — CIPROFLOXACIN HCL 500 MG PO TABS *I*
500.0000 mg | ORAL_TABLET | Freq: Two times a day (BID) | ORAL | Status: AC
Start: 2010-09-11 — End: 2010-09-13

## 2010-09-11 NOTE — Progress Notes (Signed)
 Chief complaint: Elevated PSA.    History of present illness:This patient was found to have an elevated PSA to 7.7.  He states he has been quite variable in the last 3 months.  It has been at 8.8, 6.1 and 5.8.  He has had previous prostate biopsy with no evidence of malignancy.  A trial of antibiotics were performed.  He denied significant voiding symptoms.    Medications:   Current Outpatient Prescriptions   Medication   . naproxen (NAPROSYN) 250 MG tablet   . ciprofloxacin (CIPRO) 500 MG tablet   . triamcinolone (KENALOG) 0.1 % ointment   . tamsulosin (FLOMAX) 0.4 MG   . omeprazole (PRILOSEC) 20 MG capsule   . lisinopril (PRINIVIL,ZESTRIL) 10 MG tablet       Allergies:   Allergies   Allergen Reactions   . No Known Latex Allergy    . Tetanus Toxoids      On Amb Int Med note from 08/20/09       Past medical history:   Past Medical History   Diagnosis Date   . BPH (benign prostatic hypertrophy)    . Kidney cyst    . Esophageal reflux    . Hypertension    . Hyperlipidemia    . Hypoglycemia    . Fibrolipoma      intramuscular adipose tissue   . Rotator cuff tendinitis      Right   . Sexual dysfunction      absent ejaculation       Past surgical history:   Past Surgical History   Procedure Date   . Leg tendon surgery      Right   . Knee surgery      left       Family history:   Family History   Problem Relation Age of Onset   . Heart disease Mother    . Diabetes Mother    . Diabetes Father    . Stroke Father        Social History:   History     Social History   . Marital Status: Married     Spouse Name: N/A     Number of Children: N/A   . Years of Education: N/A     Occupational History   . Not on file.     Social History Main Topics   . Smoking status: Never Smoker    . Smokeless tobacco: Never Used   . Alcohol Use: No   . Drug Use: No   . Sexually Active: Not on file     Other Topics Concern   . Not on file     Social History Narrative   . No narrative on file       Review of systems: Review of Systems - REVIEW of  SYSTEMS    Constitutional: Negative.  HEENT: Negative  Respiratory: Negative  Cardiac: Negative  GI: Negative  GU: See above.  Musculoskeletal: Negative  Neurological: Negative  Hematological: Negative  Behavorial: Negative  Skin: Negative      Vital signs: Blood pressure 134/79, pulse 61, temperature 36.6 C (97.9 F), temperature source Temporal, height 1.727 m (5\' 8" ), weight 72.122 kg (159 lb).    Physical examination:  PHYSICAL EXAMINATION    Constitutional: Alert, well-developed.    Abdominal examination: Soft, bowel sounds positive.  Nontender-nondistended. There is no evidence of CVA tenderness. There is no evidence of a mass.  There is no evidence of a hernia. There is no  suprapubic fullness or tenderness. There is no evidence of lymphadenopathy at the lymph bearing regions.    GU exam: Normal phallus.  Urethral meatus is normal.  Testes are in the scrotum in their normal position and size.  There is no tenderness.  The Epididymes are normal bilaterally.    Rectal examination: Was refused.      Radiologic evaluation included: none      Assessment:Elevated PSA with variability.    Plan:Findings options were reviewed.  He does understand that there is a risk of prostate malignancy.  Prostate biopsy is required to rule out malignancy.  This procedure and its risks were reviewed.  Options of continued surveillance versus biopsy was discussed.  He elected to proceed with biopsy.  This will be scheduled shortly.      Unk Pinto, MD 09/11/2010 5:38 PM

## 2010-09-11 NOTE — Progress Notes (Addendum)
 Chief Complaint   Patient presents with   . Knee Pain       HPI: Notes a locking/ catching in both knees. Was in accident 20 years ago. Had a screw placed in R knee, now removed. L knee also had surgery. Has had pain for 1 year now, and has gradually been worsening. Pain always present, worse with walking. Better with with rest. Has not tried any medications for this. Will place icy-hot on it at times, which helps a bit.     Allergies:  Allergies   Allergen Reactions   . Tetanus Toxoids      On Amb Int Med note from 08/20/09       Problem List  Patient Active Problem List   Diagnoses Code   . Hypoglycemia 251.2   . Hyperlipidemia 272.4   . Essential Hypertension 401.9   . Esophageal Reflux 530.81   . 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 Rotator Cuff Tendonitis 726.10   . Benign Prostatic Hyperplasia 600   . CT Abd. Adrenal Gland Focal Mass Lesion Solitary ___cm 1610   . CT Abdominal Kidney Cysts 9990   . Sexual Dysfunction, NOS 302.70   . Benign Prostatic Hypertrophy With Urinary Obstruction 600.01   . Serology Prostate-specific Antigen (PSA) Elevated 790.93       Medications: see electronic list  Medication reconciliation performed at time of visit: yes    Vitals:  Filed Vitals:    09/11/10 0835   BP: 124/80   Pulse: 60   Temp: 36.5 C (97.7 F)   TempSrc: Temporal   Height: 1.727 m (5\' 8" )   Weight: 76.885 kg (169 lb 8 oz)       Physical Exam:  General: Alert, NAD  HEENT: MMM, no oral lesions  CV: RRR, no murmurs, rubs, gallops  Pulm: CTAB, no wheezes, rales, rhonchi  Abd: +BS, soft, non-distended, non-tender  Ext: no edema or cyanosis. Crepitus with ROM of knees B. No laxity with anterior/ posterior drawer, verus or valgus stress, Apley maneuver negative. No effusion  Skin: no rashes    Assessment and Plan:  65 y.o. 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 Will check plain films of bilateral knees, refer to PT, start naproxen 250mg  BID for 1 week, then PRN.     Will refer to GI for colonoscopy  (due 2010).     Elevated PSA- will see Dr Lesia Sago today, possible biopsy in future.     Will RTC in 3 months, will have CBC, CMP, and lipids checked prior to that appointment.     Saddie Benders, MD  09/11/2010    ATTENDING ATTESTATION:  I discussed the patient with Dr. Esaw Dace at the time of the visit.  I agree with his assessment and plan of care as documented for knee pain and referral for screening colonoscopy.    Lanier Prude, MD, Faculty, [facility] 09/11/2010  10:25 AM

## 2010-09-11 NOTE — Patient Instructions (Signed)
 Will check x-rays of knees.   Take aleve 2 times daily for 1 week, then as needed.     Will refer you for a colonoscopy with Dr Fulton Reek or one of his colleagues. Someone will call you with this appointment.     Get blood work checked before your next appointment.

## 2010-10-07 ENCOUNTER — Encounter: Payer: Self-pay | Admitting: Urology

## 2010-11-11 ENCOUNTER — Encounter: Payer: Self-pay | Admitting: Urology

## 2010-11-11 ENCOUNTER — Ambulatory Visit: Payer: Self-pay | Admitting: Urology

## 2010-11-11 VITALS — BP 116/60 | HR 73 | Temp 98.6°F | Ht 69.0 in | Wt 159.0 lb

## 2010-11-11 DIAGNOSIS — Z1389 Encounter for screening for other disorder: Secondary | ICD-10-CM

## 2010-11-11 DIAGNOSIS — R972 Elevated prostate specific antigen [PSA]: Secondary | ICD-10-CM

## 2010-11-11 LAB — POCT SIEMENS URINALYSIS DIPSTICK
Blood,UA POCT: NEGATIVE
Glucose,UA POCT: NEGATIVE
Ketones,UA POCT: NEGATIVE
Kit Lot: 112075
Leukocyte,UA POCT: NEGATIVE
Nitrite,UA POCT: NEGATIVE
pH,UA POCT: 6.5 (ref 5.0–8.0)

## 2010-11-11 NOTE — Patient Instructions (Signed)
Post Prostate Biopsy Instructions    PLEASE READ CAREFULLY    1. Remember to finish antibiotics as directed    2. The biopsy needle reaches the prostate through the rectum.  The needle can enter the urethra as well (through tube in penis).    3. You may experience some blood in your stool and/or urine for up to one week.  You may also see blood in semen which can last up to one month.    4. Avoid strenuous physical activity for 3-5 days.  You may perform all other usual activities.    5. If you experience pain or discomfort, take two (2) Tylenol (acetaminophen) every 4-6 hours.  DO NOT take aspirin as this may accentuate bleeding.    6. Notify doctor if you experience fever, chills, cloudy or odorous urine, persistent burning or pain develops.    7. If bleeding occurs, drink plenty of fluids and rest.    8. Notify doctor if you have large amounts of rectal/urinary bleeding.    Physician name: Dr. Lesia Sago   Telephone #: [phone]   Instructions reviewed with patient by [facility]

## 2010-11-11 NOTE — Ambulatory Procedure (Signed)
Vitals:  Filed Vitals:    11/11/10 1418   BP: 116/60   Pulse: 73   Temp:    Height:    Weight:     indications: Elevated PSA 7.7.    Allergies:No known latex allergy and Tetanus toxoids    Medication list reviewed    The patient was placed in a lateral position and transrectal ultrasound was carried out with a biplanar 7.5 MHz probe.       Patient has abnormal digital rectal exam - no  Patient has elevated PSA - yes: 7.7  Findings - no evidence of hypoechoic areas.  No evidence of cyst.  Calcifications noted above and at the apex towards the right side.  Dimensions - see scanned image  Volume: 88.5 cc  Seminal vesicles - Normal  Central zone - Normal  Ejaculatory ducts - Normal  Transitional zone - Moderate/marked enlargement consistent with BPH  Peripheral zone - moderately compressed  Capsule - Moderate/marked enlargement consistent with BPH  Random systematic biopsies - 6 right, 6 left  Significant bleeding per rectum post biopsy - no  Amount of 1% plain lidocaine administered using 18 guage Chiba needle - 20 ml injected bilateral at the junction of the seminal vesicals and the prostate.    Patient developed a slight vagal reaction following the procedure which he recovered.  He was also bleeding.  He was discharged stable vital signs and postprocedural instructions.    Alessander Sikorski M.D.

## 2010-11-11 NOTE — Ambulatory Procedure (Signed)
11/11/2010    Surgeon -  Dr. Oran Rein    Medication list reviewed    Antibiotics - yes    Enema - yes  Blood thinners - no  SBE Prophylaxis - no    The patient was seen in Urology clinic and outpatient office transrectal ultrasound and prostate needle biopsy was performed to rule out prostatic malignancy with the biopsy device.    Tristan Mcbride M.D.

## 2010-11-13 ENCOUNTER — Other Ambulatory Visit: Payer: Self-pay | Admitting: Internal Medicine

## 2010-11-13 LAB — SURGICAL PATHOLOGY

## 2010-11-13 MED ORDER — OMEPRAZOLE 20 MG PO CPDR *I*
DELAYED_RELEASE_CAPSULE | ORAL | Status: DC
Start: 2010-11-13 — End: 2011-04-18

## 2010-11-20 ENCOUNTER — Ambulatory Visit: Payer: Self-pay | Admitting: Urology

## 2010-11-20 ENCOUNTER — Encounter: Payer: Self-pay | Admitting: Urology

## 2010-11-20 VITALS — BP 134/70 | HR 83 | Temp 98.4°F | Ht 69.0 in | Wt 159.0 lb

## 2010-11-20 DIAGNOSIS — R972 Elevated prostate specific antigen [PSA]: Secondary | ICD-10-CM

## 2010-11-20 DIAGNOSIS — Z1389 Encounter for screening for other disorder: Secondary | ICD-10-CM

## 2010-11-20 LAB — POCT SIEMENS URINALYSIS DIPSTICK
Blood,UA POCT: NEGATIVE
Glucose,UA POCT: NEGATIVE
Ketones,UA POCT: NEGATIVE
Kit Lot: 202113
Leukocyte,UA POCT: NEGATIVE
Nitrite,UA POCT: NEGATIVE
Protein,UA POCT: NEGATIVE mg/dL
pH,UA POCT: 6 (ref 5.0–8.0)

## 2010-11-20 NOTE — Progress Notes (Signed)
Elevated PSA, BPH.    This patient recently underwent transrectal ultrasound and guided biopsy of his prostate secondary to elevated PSA.  Ultrasound imaging findings are compatible with BPH.  Random biopsies were performed.  There was no evidence of malignancy.  One area showed high-grade intraepithelial neoplasia.  Clinical significance of this is uncertain as this was a small area.  Patient is doing well.  He denied any complaints.    Approximately 10 minutes or so with the patient's discussing the results.    Plan: Patient will continue taking tamsulosin 4 mg daily as directed for his symptoms.  Annual PSA and digital rectal examination was recommended.  He will followup with me on an as-needed basis.

## 2011-02-14 ENCOUNTER — Other Ambulatory Visit: Payer: Self-pay | Admitting: Internal Medicine

## 2011-03-26 ENCOUNTER — Other Ambulatory Visit: Payer: Self-pay | Admitting: Urology

## 2011-04-02 ENCOUNTER — Other Ambulatory Visit: Payer: Self-pay | Admitting: Pediatric Gastroenterology

## 2011-04-02 ENCOUNTER — Other Ambulatory Visit: Payer: Self-pay

## 2011-04-18 ENCOUNTER — Other Ambulatory Visit: Payer: Self-pay | Admitting: Internal Medicine

## 2011-08-04 ENCOUNTER — Ambulatory Visit: Payer: Self-pay | Admitting: Ophthalmology

## 2011-08-07 ENCOUNTER — Other Ambulatory Visit: Payer: Self-pay | Admitting: Internal Medicine

## 2011-08-10 ENCOUNTER — Other Ambulatory Visit: Payer: Self-pay | Admitting: Psychiatry

## 2011-08-10 MED ORDER — OMEPRAZOLE 20 MG PO CPDR *I*
20.0000 mg | DELAYED_RELEASE_CAPSULE | Freq: Every day | ORAL | Status: DC
Start: 2011-08-10 — End: 2011-12-03

## 2011-08-26 ENCOUNTER — Encounter: Payer: Self-pay | Admitting: Internal Medicine

## 2011-09-02 ENCOUNTER — Encounter: Payer: Self-pay | Admitting: Internal Medicine

## 2011-09-02 ENCOUNTER — Ambulatory Visit: Payer: Self-pay | Admitting: Internal Medicine

## 2011-09-02 VITALS — BP 130/110 | HR 72 | Ht 67.99 in | Wt 162.9 lb

## 2011-09-02 DIAGNOSIS — R972 Elevated prostate specific antigen [PSA]: Secondary | ICD-10-CM

## 2011-09-02 DIAGNOSIS — H9190 Unspecified hearing loss, unspecified ear: Secondary | ICD-10-CM

## 2011-09-02 DIAGNOSIS — I10 Essential (primary) hypertension: Secondary | ICD-10-CM

## 2011-09-02 DIAGNOSIS — E785 Hyperlipidemia, unspecified: Secondary | ICD-10-CM

## 2011-09-02 LAB — PSA (EFF.4-2010): PSA (eff. 4-2010): 6.45 ng/mL — ABNORMAL HIGH (ref 0.00–4.00)

## 2011-09-02 LAB — CBC AND DIFFERENTIAL
Baso # K/uL: 0.1 10*3/uL (ref 0.0–0.1)
Basophil %: 0.5 % (ref 0.2–1.2)
Eos # K/uL: 0.3 10*3/uL (ref 0.0–0.5)
Eosinophil %: 3 % (ref 0.8–7.0)
Hematocrit: 42 % (ref 40–51)
Hemoglobin: 13.6 g/dL — ABNORMAL LOW (ref 13.7–17.5)
Lymph # K/uL: 3.5 10*3/uL (ref 1.3–3.6)
Lymphocyte %: 36.7 % (ref 21.8–53.1)
MCV: 84 fL (ref 79–92)
Mono # K/uL: 0.7 10*3/uL (ref 0.3–0.8)
Monocyte %: 6.7 % (ref 5.3–12.2)
Neut # K/uL: 5.1 10*3/uL (ref 1.8–5.4)
Platelets: 222 10*3/uL (ref 150–330)
RBC: 5 MIL/uL (ref 4.6–6.1)
RDW: 14.6 % — ABNORMAL HIGH (ref 11.6–14.4)
Seg Neut %: 53.1 % (ref 34.0–67.9)
WBC: 9.6 10*3/uL — ABNORMAL HIGH (ref 4.2–9.1)

## 2011-09-02 LAB — COMPREHENSIVE METABOLIC PANEL
ALT: 32 U/L (ref 0–50)
AST: 21 U/L (ref 0–50)
Albumin: 4.2 g/dL (ref 3.5–5.2)
Alk Phos: 90 U/L (ref 40–130)
Anion Gap: 8 (ref 7–16)
Bilirubin,Total: 0.2 mg/dL (ref 0.0–1.2)
CO2: 27 mmol/L (ref 20–28)
Calcium: 9.3 mg/dL (ref 8.6–10.2)
Chloride: 106 mmol/L (ref 96–108)
Creatinine: 1.44 mg/dL — ABNORMAL HIGH (ref 0.67–1.17)
GFR,Black: 59 * — AB
GFR,Caucasian: 51 * — AB
Glucose: 82 mg/dL (ref 60–99)
Lab: 18 mg/dL (ref 6–20)
Potassium: 3.9 mmol/L (ref 3.3–5.1)
Sodium: 141 mmol/L (ref 133–145)
Total Protein: 7 g/dL (ref 6.3–7.7)

## 2011-09-02 LAB — LIPID PANEL
Chol/HDL Ratio: 3.7
Cholesterol: 215 mg/dL — AB
HDL: 58 mg/dL
LDL Calculated: 133 mg/dL
Non HDL Cholesterol: 157 mg/dL
Triglycerides: 120 mg/dL

## 2011-09-02 MED ORDER — LISINOPRIL 20 MG PO TABS *I*
20.0000 mg | ORAL_TABLET | Freq: Every day | ORAL | Status: DC
Start: 2011-09-02 — End: 2012-04-07

## 2011-09-02 NOTE — Addendum Note (Signed)
Addended by: Lilli Few on: 09/02/2011 05:10 PM     Modules accepted: Orders

## 2011-09-02 NOTE — Progress Notes (Signed)
CC: Hypertension    MEDICATIONS/CHART REVIEWED done at the time of visit    SUBJECTIVE: Patient comes in to the office for a followup visit for his hypertension.  He has not been seen in this office for one year.  He is currently taking all of his medications as prescribed.  His only complaint today is that he has decreased hearing in his left ear.  He denies any chest pain, shortness of breath or lower extremity swelling.    OBJECTIVE:  GENERAL:  A&OX3  EARS:  TM'S intact, non-injected, canals clear bilaterally.  THROAT:  non-injected  NECK:   supple, no significant adenopathy  LUNGS:  CTA  HEART:  RRR, normal S1, S2 no murmur  SKIN:      no rashes  LE: No edema    ASSESSMENT: Hypertension    PLAN:  1.  Increase lisinopril to 20 mg daily  2.  CBC, BMP pending  3.  Office visit in one month to recheck blood pressure  4.  Referrals to audiology for hearing evaluation  Darcel Smalling, NP

## 2011-09-02 NOTE — Patient Instructions (Addendum)
Have your labs drawn at the check out desk

## 2011-09-04 ENCOUNTER — Telehealth: Payer: Self-pay | Admitting: Internal Medicine

## 2011-09-04 ENCOUNTER — Telehealth: Payer: Self-pay

## 2011-09-04 NOTE — Telephone Encounter (Signed)
Message copied by [name] on Fri Sep 04, 2011  9:32 AM  ------       Message from: Tristan Mcbride       Created: Thu Sep 03, 2011  8:27 AM         This patient needs a f/u with urology, please call pt  ------

## 2011-09-04 NOTE — Telephone Encounter (Signed)
Spoke to patient via conference call with Spanish interpreter and advised him that his PSA was elevated. Advised him that it has been elevated in the past but that Ernst Breach, NP would like him to just f/u with urology again.  Phone number given to patient.  He verbalized understanding of information given and denied any further questions or concerns.

## 2011-09-04 NOTE — Telephone Encounter (Signed)
Message copied by Velna Ochs on Fri Sep 04, 2011 10:48 AM  ------       Message from: Jonette Pesa       Created: Fri Sep 04, 2011 10:11 AM         Has elevated PSA needs OV prostate exam.  ------

## 2011-09-15 ENCOUNTER — Ambulatory Visit: Payer: Self-pay | Admitting: Urology

## 2011-10-12 ENCOUNTER — Ambulatory Visit: Payer: Self-pay | Admitting: Internal Medicine

## 2011-10-13 ENCOUNTER — Ambulatory Visit: Payer: Self-pay | Admitting: Internal Medicine

## 2011-10-19 ENCOUNTER — Telehealth: Payer: Self-pay | Admitting: Internal Medicine

## 2011-10-19 NOTE — Telephone Encounter (Signed)
Patient will need to schedule an appt for a physical, please call number provided

## 2011-10-20 NOTE — Telephone Encounter (Signed)
Called patient, left vm for pt to call back and schedule appt.

## 2011-10-21 ENCOUNTER — Ambulatory Visit: Payer: Self-pay | Admitting: Audiologist

## 2011-10-21 NOTE — Progress Notes (Signed)
AUDIOLOGIC EVALUATION     [facility]   Audiology [address] [place], [address]  [place],  [place] [address]  Phone: [phone], Fax: [phone]     Out Patient Visit  Patient: Tristan Mcbride   MR Number: [id number]   Date of Birth: March 03, 1946   Date of Visit: 10/21/2011      PURE-TONE TEST RESULTS  Type of Testing: conventional      Test Reliability: good  Transducer: headphone  Booth: 1  ANSI S3.21.2004 (R2009)     Air Conduction Testing (dB HL and kHz)     LEFT EAR RIGHT EAR     0.125 0.25 0.50  0.75 1.0 1.5 2.0 3.0 4.0 6.0 8.0  0.125 0.25 0.50 0.75 1.0 1.5 2.0 3.0 4.0 6.0 8.0     15 20   15   30 30  60 50 35      95 95   105   110   120   110                                                     Effective Masking Level                              55 55   55   55   70   60     Bone Conduction Testing (dB HL and kHz)  Bone conduction was masked when appropriate      0.25 0.50  0.75 1.0 1.5 2.0 3.0 4.0     0.25 0.50 0.75 1.0 1.5 2.0 3.0 4.0      15 20   15   30    60     VT VT   NR   NR                                                           Effective Masking Level                          80 85 85   85   90          SPEECH AUDIOMETRY     SAT SRT Score dB HL EML  Test  SAT SRT Score dB HL EML  Test       92 % 65   W-22 MLV  100             EML   EML            EML 60 EML                                                                    Please refer to scanned [facility] 425CW MR for additional information     Notes  Threshold in dB HL  Frequency in kiloHertz (kHz) Legend   dB=decibels  HL=Hearing Level  NR=No Response  VT=Vibro-Tactile EML=Effective Masking Level  SAT=Speech Awareness Threshold  SRT=Speech Reception  Threshold  MLV=Monitored Live Voice  CD=Compact Disk     HISTORY: Referred by Ernst Breach, M.D. for an audiologic evaluation.  The patient reported that he has been unable to hear anything in his right ear for the past 15 years.  He stated that his hearing in the left ear was  "okay".  He is unsure what may have caused the reported hearing loss in the right ear, but notes an incident where "water got into his right ear" when he was a child.  He denies any tinnitus, dizziness, aural pain or fullness.    FINDINGS: Pure tone test results indicate normal hearing sensitivity sloping to a mild to moderately-severe sensorineural hearing loss in the left ear and a profound sensorineural hearing loss across all frequencies in the right ear.  Word discrimination ability in quiet was excellent when presented at a loud conversational level in the left ear.  Word discrimination could not be tested in the right ear.  Speech awareness threshold in the right ear is in good agreement with pure tone average.    RECOMMENDATIONS: Audiological re-evaluation as per Dr. Micael Hampshire.  Due to asymmetric hearing loss, referral to ENT physician and further diagnostic testing may be a consideration. Patient elected to schedule an appointment with Dr. Neil Crouch on 11/10/11.    Darrin Butler, B.A.  Audiology Licensure Applicant    [place], [place], CCC-A  Clinical Audiologist  [facility]iology

## 2011-11-10 ENCOUNTER — Ambulatory Visit: Payer: Self-pay | Admitting: Otolaryngology

## 2011-11-10 ENCOUNTER — Encounter: Payer: Self-pay | Admitting: Otolaryngology

## 2011-11-10 VITALS — BP 121/65 | HR 79 | Ht 67.0 in | Wt 156.0 lb

## 2011-11-10 DIAGNOSIS — I10 Essential (primary) hypertension: Secondary | ICD-10-CM

## 2011-11-10 DIAGNOSIS — IMO0001 Reserved for inherently not codable concepts without codable children: Secondary | ICD-10-CM | POA: Insufficient documentation

## 2011-11-10 LAB — CBC
Hematocrit: 43 % (ref 40–51)
Hemoglobin: 14.1 g/dL (ref 13.7–17.5)
MCV: 84 fL (ref 79–92)
Platelets: 246 10*3/uL (ref 150–330)
RBC: 5.1 MIL/uL (ref 4.6–6.1)
RDW: 14.5 % — ABNORMAL HIGH (ref 11.6–14.4)
WBC: 9.9 10*3/uL — ABNORMAL HIGH (ref 4.2–9.1)

## 2011-11-10 LAB — BASIC METABOLIC PANEL
Anion Gap: 10 (ref 7–16)
CO2: 28 mmol/L (ref 20–28)
Calcium: 9.5 mg/dL (ref 8.6–10.2)
Chloride: 103 mmol/L (ref 96–108)
Creatinine: 1.37 mg/dL — ABNORMAL HIGH (ref 0.67–1.17)
GFR,Black: 63 *
GFR,Caucasian: 54 * — AB
Glucose: 85 mg/dL (ref 60–99)
Lab: 22 mg/dL — ABNORMAL HIGH (ref 6–20)
Potassium: 4.6 mmol/L (ref 3.3–5.1)
Sodium: 141 mmol/L (ref 133–145)

## 2011-11-10 LAB — MULTIPLE ORDERING DOCS

## 2011-11-10 NOTE — Progress Notes (Signed)
Tristan Mcbride is a 66 yo man who comes to see me for asymmetric hearing loss which is worse on the right side.  The patient seems to speak [place] but is a man of few words and does not provide more information that is needed when asked.  The hearing loss seems to have occurred at least 15 years ago on the right side.  It is unclear if it occurred suddenly or slowly over time.  There is no tinnitus or vertigo associated with the hearing loss or afterwards.  The patient had an audiogram performed earlier this month which I reviewed which reveals no hearing on the right side and a mild sensorineural hearing loss sloping at the higher frequencies on the left.    He has not previously had any imaging of his head.    Past medical history: He has some high blood pressure but is otherwise healthy with no history of heart problems diabetes or cancer.    Past surgical history: He is had no prior surgery.    Social history: He does not drink alcohol or smoke.  He comes to this visit with his wife.    Review of systems is reviewed by me and except for the hearing loss is completely negative.    Family history: There is no family history of hearing loss or heart disease.  His mother had high blood pressure and diabetes.    Physical exam: The patient is 5 feet 9 inches tall weight 156 pounds.  He comes to this visit with his wife who did not talk during the visit.  The patient is well developed, well nourished, and in no acute distress.  They are able to communicate without assistance or hoaresness.  The patient had a calm affect.  Head is atraumatic and normal cephalic.  There was no sinus tenderness.  The salivary glands appeared normal.  Facial symmetry was normal bilaterally.      Extraocular movements were intact, there was no nystagmus at rest.    Right ear:  The pinna was normal.  The external auditory canal was dry and non-erythematous.  The tympanic membrane was intact and mobile with pneumatic otoscopy.  No fluid was  visible behind the tympanic membrane.      Left ear:  The pinna was normal.  The external auditory canal was dry and non-erythematous.  The tympanic membrane was intact and mobile with pneumatic otoscopy.  No fluid was visible behind the tympanic membrane.      Nose:  Turbinates were healthy and intact.  Mucosa appeared normal, the septum was near midline and there was no visible perforation.  No polyps were visible.    Oral cavity:  Normal healthy mucosa.  No visible ulcers.  Lips were pink and moist.  Tongue was mobile with no visible masses.  Hard and soft palates were intact.    Neck:  Full range of motion with the trachea in the midline position.  There was no thyromegaly or lymphadenopathy.    AP  The patient is an asymmetric sensorineural hearing loss which is worse on the right side.  I have requested an MRI to rule out retrocochlear pathology.  I also briefly discussed with him some rehabilitative options for single sided deafness however it is not clear if he is interested in any of these.  I will see him back after the MRI and we can revisit hearing rehabilitation at that time.

## 2011-11-11 ENCOUNTER — Telehealth: Payer: Self-pay | Admitting: Internal Medicine

## 2011-11-11 ENCOUNTER — Encounter: Payer: Self-pay | Admitting: Otolaryngology

## 2011-11-11 NOTE — Communication Body (Signed)
Booked MRI Head w & w/o contrast at [facility]  11-17-11   10:45AM (No Prep)   F/U Dr. Neil Crouch 12-01-11   11:45AM--Faxed req for spanish inter or  Use Blue Phone---patient speaks only a little English  Order put in for BUN/CREAT---patient had done yesterday--results in e-records  Auth#  [id number]  Exp  12-26-11  Confirmed Pt & VM---Pt to have someone help  retrieve info from VM--   SC  11-11-11   1:36PM

## 2011-11-11 NOTE — Telephone Encounter (Signed)
Message copied by Leighton Parody on Wed Nov 11, 2011 10:00 AM  ------       Message from: Darcel Smalling       Created: Wed Nov 11, 2011  9:11 AM         He needs an office visit here with either me or his PCP for BP check at last appointment he was told to be seen in one month no appt made.  ------

## 2011-11-11 NOTE — Telephone Encounter (Addendum)
LM for pt to call back ( he needs an appt asap with Lura or Dr. Haynes Hoehn for f/u high BP     Unable to reach pt after 2 attempts.  Letter mailed

## 2011-11-27 ENCOUNTER — Encounter: Payer: Self-pay | Admitting: Otolaryngology

## 2011-11-27 ENCOUNTER — Other Ambulatory Visit: Payer: Self-pay | Admitting: Otolaryngology

## 2011-11-27 NOTE — Communication Body (Signed)
Rebooked MRI Head w & w/o contrast at [facility] 11-30-11  8:45AM (No Prep)  Pt. To keep F/U with Spanish Interp. 12-01-11   11:45AM  Confirmed VIA [employer] phone-spanish inter. 11-27-11  1:10PM  SC

## 2011-11-29 ENCOUNTER — Other Ambulatory Visit: Payer: Self-pay | Admitting: Urology

## 2011-12-01 ENCOUNTER — Encounter: Payer: Self-pay | Admitting: Gastroenterology

## 2011-12-01 ENCOUNTER — Ambulatory Visit: Payer: Self-pay

## 2011-12-01 ENCOUNTER — Ambulatory Visit: Payer: Self-pay | Admitting: Otolaryngology

## 2011-12-01 ENCOUNTER — Other Ambulatory Visit: Payer: Self-pay

## 2011-12-01 ENCOUNTER — Encounter: Payer: Self-pay | Admitting: Otolaryngology

## 2011-12-01 VITALS — BP 124/74 | HR 61 | Ht 69.0 in | Wt 165.0 lb

## 2011-12-01 DIAGNOSIS — H9191 Unspecified hearing loss, right ear: Secondary | ICD-10-CM

## 2011-12-01 DIAGNOSIS — IMO0001 Reserved for inherently not codable concepts without codable children: Secondary | ICD-10-CM

## 2011-12-01 NOTE — Progress Notes (Signed)
Tristan Mcbride is a 66 yo Spanish speaking man who comes to this visit with a translator Jacqlyn Larsen).  I previously saw him for a history of a right-sided deafness which she's had for many years.  He was interested rehabilitative options.  At the last visit we get an MRI he now returns for the MRI results results.  I discussed this with him and told him that it was normal.    PE  Patient is alert and oriented in no acute distress.  The external auditory canals are dry bilaterally.    AP  I discussed with him in detail his rehabilitative options for his right single sided deafness.  The hearing loss is too severe to be helped by a traditional hearing at.  A CROS hearing aid would be an option but require him to wear hearing aid in both years including is better left-sided ear.  Another option would be the Baha which we demoed in clinic.  Although he liked the Baha, he was concerned about the need for a surgical procedure.  He opted to pursue the CROS hearing aid and I have recommended an appointment for hearing aid evaluation with audiology.    This visit took about 25 minutes most of which was spent on patient counseling.

## 2011-12-03 ENCOUNTER — Ambulatory Visit: Payer: Self-pay

## 2011-12-03 ENCOUNTER — Ambulatory Visit: Payer: Self-pay | Admitting: Internal Medicine

## 2011-12-03 ENCOUNTER — Encounter: Payer: Self-pay | Admitting: Internal Medicine

## 2011-12-03 VITALS — BP 124/88 | HR 80 | Temp 98.1°F | Ht 69.0 in | Wt 163.1 lb

## 2011-12-03 DIAGNOSIS — Z0000 Encounter for general adult medical examination without abnormal findings: Secondary | ICD-10-CM

## 2011-12-03 DIAGNOSIS — I10 Essential (primary) hypertension: Secondary | ICD-10-CM

## 2011-12-03 LAB — BASIC METABOLIC PANEL
Anion Gap: 12 (ref 7–16)
CO2: 23 mmol/L (ref 20–28)
Calcium: 8.9 mg/dL (ref 8.6–10.2)
Chloride: 105 mmol/L (ref 96–108)
Creatinine: 1.27 mg/dL — ABNORMAL HIGH (ref 0.67–1.17)
GFR,Black: 69 *
GFR,Caucasian: 59 * — AB
Glucose: 118 mg/dL — ABNORMAL HIGH (ref 60–99)
Lab: 19 mg/dL (ref 6–20)
Potassium: 3.6 mmol/L (ref 3.3–5.1)
Sodium: 140 mmol/L (ref 133–145)

## 2011-12-03 LAB — HM HIV SCREENING OFFERED

## 2011-12-03 MED ORDER — OMEPRAZOLE 20 MG PO CPDR *I*
20.0000 mg | DELAYED_RELEASE_CAPSULE | Freq: Every day | ORAL | Status: DC
Start: 2011-12-03 — End: 2012-04-05

## 2011-12-03 MED ORDER — TRIAMCINOLONE ACETONIDE 0.1 % EX OINT *I*
TOPICAL_OINTMENT | Freq: Two times a day (BID) | CUTANEOUS | Status: DC
Start: 2011-12-03 — End: 2012-09-22

## 2011-12-03 NOTE — Patient Instructions (Addendum)
Have your labs drawn at the check out desk

## 2011-12-03 NOTE — Progress Notes (Signed)
CC: Hypertension  MEDICATIONS/CHART REVIEWED done at the time of visit    SUBJECTIVE: Patient speaks only Spanish entire visit conducted with the aid of a Spanish interpreter.  Patient states that he is doing well.  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  He usually gets this every summer, when it's hot.  He has tried over-the-counter creams without any relief.  He denies any chest pain, shortness of breath or lower extremity edema.    OBJECTIVE:  GENERAL:  A&OX3  LUNGS:  CTA  HEART:  RRR, normal S1, S2 no murmur  SKIN:      Hyperpigmented moist.  Bilateral groin    ASSESSMENT:Hypertension, tinea    PLAN  1.  No change in blood pressure medications at this time  2.  Triamcinolone cream 2 times a day to area  3.  If not better or worse call  4.  Office visit in 3 months for repeat labs and blood pressure  5.  HIV, BMP pending  Darcel Smalling, NP

## 2011-12-04 LAB — HIV 1&2 ANTIGEN/ANTIBODY: HIV 1&2 ANTIGEN/ANTIBODY: NONREACTIVE

## 2011-12-08 ENCOUNTER — Telehealth: Payer: Self-pay | Admitting: Internal Medicine

## 2011-12-08 NOTE — Telephone Encounter (Signed)
Message copied by Gayleen Orem on Tue Dec 08, 2011 12:25 PM  ------       Message from: Tristan Mcbride       Created: Mon Dec 07, 2011  8:15 AM         Please let him know hiv neg and bmp stable  ------

## 2011-12-10 NOTE — Telephone Encounter (Signed)
Attempted to phone pt, needs Spanish language interpreter. Paged [facility] Spanish language interpreter.

## 2011-12-10 NOTE — Telephone Encounter (Signed)
[facility]-provided Spanish Interpreter phoned pt, unable to reach him. Message left for him to call back.

## 2011-12-11 NOTE — Telephone Encounter (Signed)
Called patient via cyracom phone. Unable to reach him. LM for patient to return our call to discuss below results.

## 2011-12-24 NOTE — Telephone Encounter (Signed)
[employer] interpreter phone used.  LM for patient to return call to medicine clinic and phone number left for patient.

## 2011-12-28 ENCOUNTER — Ambulatory Visit: Payer: Self-pay

## 2011-12-28 NOTE — Progress Notes (Signed)
Audiology Consult      [facility] Audiology  [address] [place]  [place], [place] [address]  Phone: [phone]  Fax: [phone]      Patient Name: Tristan Mcbride  MRN: [id number]  Date of Birth: [birth year]     Date of Service: 12/28/2011     History: Pt was referred by Dr. Neil Crouch for a hearing aid evaluation today. 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 He has a known mild to moderately severe at and above 2 kHz in the left ear and a profound hearing loss in the right ear. He was accompanied by his wife and a [facility] Spanish interpreter today. He was previously seen by Dr. Neil Crouch who demonstrated a Baha hearing aid. [name] was not interested in surgery and expressed interest in a CROS hearing aid.     Although, [name] is a potential hearing aid candidate in his left ear, he does not currently meet hearing aid requirements from his insurance due to normal hearing below 2 kHz in the left ear, and his insurance and/or [facility] will NOT cover a CROS hearing aid. The CROS is considered an accessory and is not a covered expense by his insurance or [facility]. The limits of his insurance was discussed in detail and information regarding [facility] was given to the pt. He was encouraged to investigate his options for amplification. Hearing aid approval will be pursued through his insurance since a denial will be required by [facility] and/or it is possible that his insurance may make an exception for coverage. An [facility] application (in Spanish) and information regarding the [facility] hearing aid dispensing program was given to the pt.    Recommendations:  Hearing aid evaluation was scheduled for 8-12 weeks pending [facility] approval and may be scheduled earlier if insurance approval for a left hearing aid is obtained.    Qunicy Higinbotham Shannan Harper, Au.D., CCC-A  Audiologist  [facility]

## 2012-01-07 NOTE — Telephone Encounter (Signed)
Spoke with patient.  Results from labwork relayed via spanish interpreter.    Instructed pt his HIV test was negative and his BMP was stable.  Instructed pt that HIV test does not cover the past 3 months so if he had any concerns about being exposed to the virus he would need to be retested.   Pt states he had no concerns and thanked [place] for the information.

## 2012-03-03 ENCOUNTER — Ambulatory Visit: Payer: Self-pay | Admitting: Psychiatry

## 2012-03-24 ENCOUNTER — Ambulatory Visit: Payer: Self-pay

## 2012-04-05 ENCOUNTER — Other Ambulatory Visit: Payer: Self-pay

## 2012-04-05 ENCOUNTER — Telehealth: Payer: Self-pay | Admitting: Internal Medicine

## 2012-04-05 NOTE — Telephone Encounter (Signed)
INFORMATION:     Clinical research associate with the assistance of a Spanish interpreter called the wife who indicates that patient and she are separated and she was unable to answer writer's questions regarding income and content of letter from [facility]. She did however give Clinical research associate a cell phone number for patient [phone]).      Writer with assistance from Spanish interpreter called patient who reports that the letter from [facility] indicated that he was now over income for Medicaid. Patient gets SSD and has a part time job. If what he reports to be his current income is accurate he is currently over income for Medicaid. However come January 1st  2014 he will be under the income limit for Medicaid.    ACTION REQUIRED:      Per patient's report he has been without his Prilosec and his Flomax for nearly two months      Script for Prilosec and Flomax need to be sent to the [facility] pharmacy         PLAN:      Writer to send this msg to Dr. Haynes Hoehn who is in clinic on 04/07/12 and can address the need for the Rx at that time as patient indicates he can wait until Thursday.

## 2012-04-05 NOTE — Telephone Encounter (Signed)
Per Social Work of 04/05/12 patient needs these two medications to go to [facility]  Flomax  Prilosec

## 2012-04-05 NOTE — Telephone Encounter (Signed)
Patients wife states he has refills to be picked up but has lost his insurance coverage. Patient requests help paying for his Prilosec and BP meds. Will route to social work. Please advise. Call pt back with a spanish interpreter.

## 2012-04-07 ENCOUNTER — Other Ambulatory Visit: Payer: Self-pay | Admitting: Psychiatry

## 2012-04-07 ENCOUNTER — Other Ambulatory Visit: Payer: Self-pay | Admitting: Internal Medicine

## 2012-04-07 DIAGNOSIS — I10 Essential (primary) hypertension: Secondary | ICD-10-CM

## 2012-04-07 MED ORDER — LISINOPRIL 20 MG PO TABS *I*
20.0000 mg | ORAL_TABLET | Freq: Every day | ORAL | Status: DC
Start: 2012-04-07 — End: 2012-06-14

## 2012-04-07 MED ORDER — TAMSULOSIN HCL 0.4 MG PO CAPS *I*
0.4000 mg | ORAL_CAPSULE | Freq: Every evening | ORAL | Status: DC
Start: 2012-04-05 — End: 2012-06-14

## 2012-04-07 MED ORDER — OMEPRAZOLE 20 MG PO CPDR *I*
20.0000 mg | DELAYED_RELEASE_CAPSULE | Freq: Every day | ORAL | Status: DC
Start: 2012-04-05 — End: 2012-06-14

## 2012-04-07 NOTE — Telephone Encounter (Signed)
Voucher sent to pharmacy  Spanish speaking tech(Sabrina)left msg for patient that medications were in our pharmacy and that he can pick them up at no charge.  ----------------------       [facility] SOCIAL WORK  PHARMACY FORM  Today's date:  April 07, 2012    Patient Name: Tristan Mcbride   Medical Record #:  [id number]       Patient's Address:  [address], [address]                     DOB:  16-Nov-1945      Social Worker: Marlan Palau, LMSW      Date of Service: April 07, 2012         Funding Source   []  Medicaid Pending       [x]  [place] Medication Assistance Fund   []  [employer]     [] Other source                    ___________________________________________________________________  Pharmacy Information:     Date/time sent:April 07, 2012                                           Time needed: 12/26- 04-11-12   []  ED []  After hours                                                       [x]  [facility]   []  Please call ext.     when ready                      X Patient will pick up at pharmacy   []  Please tube to station #      when ready  Pharmacy Contact:         Social work signature: Marlan Palau, LMSW  X [phone]  Or   [phone]  Supervisor/Manager Approval (if indicated) REQUIRED FOR ANYTHING > $100.00  Supervisor:         Date:  (supervisor signature not required for [facility] pending)    Pharmacy Fax # [phone]  Tube [phone]  Print form and fax or tube to pharmacy.  If it requires Supervisor approval copy and email to supervisor

## 2012-05-11 ENCOUNTER — Telehealth: Payer: Self-pay | Admitting: Internal Medicine

## 2012-05-11 NOTE — Telephone Encounter (Signed)
Pt is calling requesting to speak with a social worker in regards to her medication.

## 2012-05-12 NOTE — Telephone Encounter (Signed)
Caller is calling back about medications to speak w/social worker. Asking to be called back @ number provided.

## 2012-05-12 NOTE — Telephone Encounter (Signed)
Call attempted to both numbers in task but no voice mail or answer  E-mail sent to Spanish Interpreters requesting that they call patient or his ex-wife to let them know a voucher is in our outpaitent pharmacy and once he calls to get refill he can come and pick-up the medications without having to pay his co-pay.  ------------------------  [facility] SOCIAL WORK  PHARMACY FORM  Today's date:  May 12, 2012    Patient Name: Tristan Mcbride   Medical Record #:  [id number]       Patient's Address:  [address], [address]                     DOB:  1946-02-13      Social Worker: Marlan Palau, LMSW      Date of Service: May 12, 2012         Funding Source   []  Medicaid Pending       [x]  [place] Medication Assistance Fund   []  [employer]     [] Other source                    ___________________________________________________________________  Pharmacy Information:     Date/time sent:May 12, 2012                     Time needed: 1-30- 2/3   []  ED []  After hours                                [x]  Outpatient: [facility]   []  Please call ext.     when ready  [x]  Patient will pick up at pharmacy   []  Please tube to station #      when ready  Pharmacy Contact:         Social work signature: Marlan Palau, LMSW [phone]  Or   [phone]  Supervisor/Manager Approval (if indicated) REQUIRED FOR ANYTHING >$100.00  Supervisor :         Date:  (supervisor signature not required for [facility] pending)    Pharmacy Fax # [phone]  Tube [phone]  Print form and fax or tube to pharmacy.  If it requires Supervisor approval copy and email to supervisor

## 2012-05-12 NOTE — Telephone Encounter (Signed)
Pt is calling again today requesting a callback regarding her medications.  Pt states she needs a medication refill on several medications and would like to speak to a Child psychotherapist regarding this.  Please call pt back at provided number

## 2012-06-13 ENCOUNTER — Encounter: Payer: Self-pay | Admitting: Internal Medicine

## 2012-06-13 NOTE — Telephone Encounter (Signed)
Erroneous encounter

## 2012-06-14 ENCOUNTER — Other Ambulatory Visit: Payer: Self-pay | Admitting: Internal Medicine

## 2012-06-14 DIAGNOSIS — I10 Essential (primary) hypertension: Secondary | ICD-10-CM

## 2012-06-14 MED ORDER — LISINOPRIL 20 MG PO TABS *I*
20.0000 mg | ORAL_TABLET | Freq: Every day | ORAL | Status: DC
Start: 2012-06-14 — End: 2012-07-18

## 2012-06-14 MED ORDER — OMEPRAZOLE 20 MG PO CPDR *I*
20.0000 mg | DELAYED_RELEASE_CAPSULE | Freq: Every day | ORAL | Status: DC
Start: 2012-06-14 — End: 2012-07-18

## 2012-06-14 MED ORDER — TAMSULOSIN HCL 0.4 MG PO CAPS *I*
0.4000 mg | ORAL_CAPSULE | Freq: Every evening | ORAL | Status: DC
Start: 2012-06-14 — End: 2012-07-18

## 2012-07-18 ENCOUNTER — Telehealth: Payer: Self-pay | Admitting: Internal Medicine

## 2012-07-18 DIAGNOSIS — I10 Essential (primary) hypertension: Secondary | ICD-10-CM

## 2012-07-18 MED ORDER — TAMSULOSIN HCL 0.4 MG PO CAPS *I*
0.4000 mg | ORAL_CAPSULE | Freq: Every evening | ORAL | Status: DC
Start: 2012-07-18 — End: 2012-08-18

## 2012-07-18 MED ORDER — LISINOPRIL 20 MG PO TABS *I*
20.0000 mg | ORAL_TABLET | Freq: Every day | ORAL | Status: DC
Start: 2012-07-18 — End: 2012-08-18

## 2012-07-18 MED ORDER — OMEPRAZOLE 20 MG PO CPDR *I*
20.0000 mg | DELAYED_RELEASE_CAPSULE | Freq: Every day | ORAL | Status: DC
Start: 2012-07-18 — End: 2012-08-18

## 2012-07-18 NOTE — Telephone Encounter (Signed)
Writer sent voucher down to pharmacy and left voicemail message for [name], letting her know that voucher had been sent down.    Plan:  Writer to remain available prn.

## 2012-07-18 NOTE — Telephone Encounter (Signed)
[facility] SOCIAL WORK  PHARMACY FORM  Today's date:  July 18, 2012    Patient Name: Tristan Mcbride   Medical Record #:  [id number]       Patient's Address:  [address] [place], [place]                     DOB:  Feb 25, 1946      Social Worker: Harriette Ohara, LMSW      Date of Service: July 18, 2012         Funding Source   []  Medicaid Pending       [x]  [place] Medication Assistance Fund   []  [employer] Fund     [] Other source                    ___________________________________________________________________  Pharmacy Information:     Date/time sent:July 18, 2012 Time needed:  ASAP         []  ED []  After hours [x]  Outpatient []  Inpatient (Specify unit) Room/bed info not found     []  Please call ext.     when ready  [x]  Patient will pick up at pharmacy   []  Please tube to station #      when ready      Pharmacy Contact:         Social work signature: Harriette Ohara, [place] [id number]  Supervisor/Manager Approval (if indicated) :         Date:  (supervisor signature not required for [facility] pending)    Pharmacy Fax # [phone]  Tube [phone]  Print form and fax or tube to pharmacy.  If it requires Supervisor approval copy and email to supervisor

## 2012-07-18 NOTE — Telephone Encounter (Signed)
Medication request routed to Cherlynn Polo, MD for processing 07/18/2012 11:44 AM.    Requested Prescriptions     Pending Prescriptions Disp Refills   . lisinopril (PRINIVIL,ZESTRIL) 20 MG tablet 30 tablet 5     Sig: Take 1 tablet (20 mg total) by mouth daily   . tamsulosin (FLOMAX) 0.4 MG 30 capsule 4     Sig: Take 1 capsule (0.4 mg total) by mouth at bedtime   . omeprazole (PRILOSEC) 20 MG capsule 30 capsule 3     Sig: Take 1 capsule (20 mg total) by mouth daily (before breakfast)

## 2012-07-18 NOTE — Telephone Encounter (Signed)
Tristan Mcbride the patients wife called and the patient needs to have his medication refilled. Tristan Mcbride says the social worker needs to call the [facility] pharmacy, the patient does not have insurance. If necessary Tristan Mcbride can be reached at [phone].

## 2012-08-18 ENCOUNTER — Other Ambulatory Visit: Payer: Self-pay | Admitting: Internal Medicine

## 2012-08-18 DIAGNOSIS — I10 Essential (primary) hypertension: Secondary | ICD-10-CM

## 2012-08-18 MED ORDER — LISINOPRIL 20 MG PO TABS *I*
20.0000 mg | ORAL_TABLET | Freq: Every day | ORAL | Status: DC
Start: 2012-08-18 — End: 2012-09-22

## 2012-08-18 MED ORDER — OMEPRAZOLE 20 MG PO CPDR *I*
20.0000 mg | DELAYED_RELEASE_CAPSULE | Freq: Every day | ORAL | Status: DC
Start: 2012-08-18 — End: 2012-09-22

## 2012-08-18 MED ORDER — TAMSULOSIN HCL 0.4 MG PO CAPS *I*
0.4000 mg | ORAL_CAPSULE | Freq: Every evening | ORAL | Status: DC
Start: 2012-08-18 — End: 2012-09-22

## 2012-08-18 NOTE — Telephone Encounter (Signed)
Medication request routed to MD for processing 08/18/2012 9:28 AM.    Requested Prescriptions     Pending Prescriptions Disp Refills   . tamsulosin (FLOMAX) 0.4 MG 30 capsule 4     Sig: Take 1 capsule (0.4 mg total) by mouth at bedtime   . omeprazole (PRILOSEC) 20 MG capsule 30 capsule 3     Sig: Take 1 capsule (20 mg total) by mouth daily (before breakfast)   . lisinopril (PRINIVIL,ZESTRIL) 20 MG tablet 30 tablet 5     Sig: Take 1 tablet (20 mg total) by mouth daily

## 2012-08-18 NOTE — Telephone Encounter (Signed)
Requested Prescriptions     Pending Prescriptions Disp Refills   . tamsulosin (FLOMAX) 0.4 MG 30 capsule 4     Sig: Take 1 capsule (0.4 mg total) by mouth at bedtime   . omeprazole (PRILOSEC) 20 MG capsule 30 capsule 3     Sig: Take 1 capsule (20 mg total) by mouth daily (before breakfast)   . lisinopril (PRINIVIL,ZESTRIL) 20 MG tablet 30 tablet 5     Sig: Take 1 tablet (20 mg total) by mouth daily     Patient's wife, Loralee Pacas, calling to request refills for the above to be sent to [facility]. Any questions, Loralee Pacas can be reached at [phone]

## 2012-08-19 ENCOUNTER — Telehealth: Payer: Self-pay | Admitting: Internal Medicine

## 2012-08-19 NOTE — Telephone Encounter (Signed)
The patient called to check on the status of his medications. I let him know they are not ready yet. The patient requesting a phone call at [phone].

## 2012-08-19 NOTE — Telephone Encounter (Signed)
Rx for tamulosin, lisinopril and omeprazole were done yesterday by Dr. Haynes Hoehn.     Call to patient at [phone], wife stated he is at work and advised me to call him at [phone]. Spoke with patient and advised him that Rxs were done yesterday.

## 2012-08-22 ENCOUNTER — Telehealth: Payer: Self-pay | Admitting: Internal Medicine

## 2012-08-22 NOTE — Telephone Encounter (Signed)
Patient had trouble picking up his medications. Joharty unsure if patient just needed a voucher to pick up script. Patient was going to pick up omeprazole, tamsulosin, and lisinopril, which all seem to be ready at [facility] for pick up. Please assist with voucher.

## 2012-08-22 NOTE — Telephone Encounter (Signed)
Pt unable to afford his copays. Pt state he will pick up his Rxs later today.    [facility] SOCIAL WORK  PHARMACY FORM  Today's date:  Aug 22, 2012    Patient Name: Tristan Mcbride   Medical Record #:  [id number]       Patient's Address:  [address] [place], [place] [address]                     DOB:  23-Sep-1945      Social Worker: Harriette Ohara, LMSW      Date of Service: Aug 22, 2012         Funding Source   []  Medicaid Pending       [x]  SW Medication Assistance Fund   []  [employer] Fund     [] Other source                    ___________________________________________________________________  Pharmacy Information:     Date/time sent:Aug 22, 2012 Time needed:   ASAP         []  ED []  After hours [x]  Outpatient []  Inpatient (Specify unit) Room/bed info not found     []  Please call ext.     when ready  [x]  Patient will pick up at pharmacy   []  Please tube to station #      when ready      Pharmacy Contact:         Social work signature: Harriette Ohara, [place] [id number]  Supervisor/Manager Approval (if indicated) :         Date:  (supervisor signature not required for [facility] pending)    Pharmacy Fax # [phone]  Tube [phone]  Print form and fax or tube to pharmacy.  If it requires Supervisor approval copy and email to supervisor

## 2012-09-22 ENCOUNTER — Ambulatory Visit: Payer: Self-pay

## 2012-09-22 ENCOUNTER — Ambulatory Visit: Payer: Self-pay | Admitting: Internal Medicine

## 2012-09-22 ENCOUNTER — Encounter: Payer: Self-pay | Admitting: Internal Medicine

## 2012-09-22 VITALS — BP 140/80 | HR 66 | Temp 97.5°F | Ht 68.9 in | Wt 162.8 lb

## 2012-09-22 DIAGNOSIS — R739 Hyperglycemia, unspecified: Secondary | ICD-10-CM

## 2012-09-22 DIAGNOSIS — I10 Essential (primary) hypertension: Secondary | ICD-10-CM

## 2012-09-22 DIAGNOSIS — L309 Dermatitis, unspecified: Secondary | ICD-10-CM

## 2012-09-22 LAB — CBC
Hematocrit: 45 % (ref 40–51)
Hemoglobin: 14.5 g/dL (ref 13.7–17.5)
MCV: 85 fL (ref 79–92)
Platelets: 212 10*3/uL (ref 150–330)
RBC: 5.3 MIL/uL (ref 4.6–6.1)
RDW: 14.3 % (ref 11.6–14.4)
WBC: 9.6 10*3/uL — ABNORMAL HIGH (ref 4.2–9.1)

## 2012-09-22 LAB — BASIC METABOLIC PANEL
Anion Gap: 9 (ref 7–16)
CO2: 27 mmol/L (ref 20–28)
Calcium: 9.7 mg/dL (ref 8.6–10.2)
Chloride: 104 mmol/L (ref 96–108)
Creatinine: 1.29 mg/dL — ABNORMAL HIGH (ref 0.67–1.17)
GFR,Black: 67 *
GFR,Caucasian: 58 * — AB
Glucose: 83 mg/dL (ref 60–99)
Lab: 20 mg/dL (ref 6–20)
Potassium: 4.1 mmol/L (ref 3.3–5.1)
Sodium: 140 mmol/L (ref 133–145)

## 2012-09-22 LAB — PSA (EFF.4-2010): PSA (eff. 4-2010): 7.34 ng/mL — ABNORMAL HIGH (ref 0.00–4.00)

## 2012-09-22 MED ORDER — OMEPRAZOLE 20 MG PO CPDR *I*
20.0000 mg | DELAYED_RELEASE_CAPSULE | Freq: Every day | ORAL | Status: DC
Start: 2012-09-22 — End: 2013-11-29

## 2012-09-22 MED ORDER — LISINOPRIL 20 MG PO TABS *I*
20.0000 mg | ORAL_TABLET | Freq: Every day | ORAL | Status: DC
Start: 2012-09-22 — End: 2012-09-22

## 2012-09-22 MED ORDER — TAMSULOSIN HCL 0.4 MG PO CAPS *I*
0.4000 mg | ORAL_CAPSULE | Freq: Every evening | ORAL | Status: DC
Start: 2012-09-22 — End: 2013-03-16

## 2012-09-22 MED ORDER — TRIAMCINOLONE ACETONIDE 0.1 % EX OINT *I*
TOPICAL_OINTMENT | Freq: Two times a day (BID) | CUTANEOUS | Status: DC
Start: 2012-09-22 — End: 2013-11-27

## 2012-09-22 MED ORDER — LISINOPRIL 20 MG PO TABS *I*
20.0000 mg | ORAL_TABLET | Freq: Every day | ORAL | Status: DC
Start: 2012-09-22 — End: 2013-04-13

## 2012-09-22 MED ORDER — TAMSULOSIN HCL 0.4 MG PO CAPS *I*
0.4000 mg | ORAL_CAPSULE | Freq: Every evening | ORAL | Status: DC
Start: 2012-09-22 — End: 2012-09-22

## 2012-09-22 MED ORDER — OMEPRAZOLE 20 MG PO CPDR *I*
20.0000 mg | DELAYED_RELEASE_CAPSULE | Freq: Every day | ORAL | Status: DC
Start: 2012-09-22 — End: 2013-04-14

## 2012-09-22 NOTE — Patient Instructions (Addendum)
Have your labs drawn at the check out desk

## 2012-09-22 NOTE — Progress Notes (Signed)
CC: Hypertension, esophageal reflux    MEDICATIONS/CHART REVIEWED done at time of visit    SUBJECTIVE: Tristan Mcbride is a 67 year old Hispanic male.  Entire visit conducted with the aid of a hospital Spanish interpreter. Patient states he wants a check up It has been almost one year since he was in this office. He recently changed his insurance.  He is taking his medications as prescribed.  His reflux is good with the omeprazole.  He denies any increased thirst or urination, chest pain, shortness of breath or lower extremity edema.  He does occasionally get an itchy rash on his right ankle.  He has used triamcinolone cream in the past which usually resolves it.  He does not smoke or drink.  He is requesting a PSA.    OBJECTIVE:  GENERAL:  A&OX3  EARS:  TM'S intact, non-injected, canals clear bilaterally.  THROAT:  non-injected  NECK:   supple, no significant adenopathy  LUNGS:  CTA  HEART:  RRR, normal S1, S2 no murmur  SKIN:      no rashes  Noted labs done last year showed elevated blood sugar    ASSESSMENT: Hypertension, GERD hyperglycemia, dermatitis    PLAN:  1.  CBC, BMP, hemoglobin A1c, PSA  2.  Triamcinolone cream to apply to area twice daily  3.  Office visit in 3 months to recheck blood pressure      Hypertension:   Based on this patient's clinical history and according to JNC guidelines, target BP goal is:  Less than 140/90 and less than 130/80  Based on the last BP of BP: 140/80 mmHg, the patient is:  Goal discussed with patient and above goal  The plan to reach goal:   Reviewed patient's understanding of medications including barriers to adherence:  Yes  Hypertension lifestyle modifications:  Weight reduction, exercise plan, medication compliance and discussed dietary sodium reduction  Patient understands their clinical goals and will undertake self-managment recommendations including:  Weight reduction, exercise plan, medication compliance and dietary sodium reduction  Medication Management:  No changes  made  Referral to Care Management:  No  Patient Ed/Self Management tools provided:  Current self-management tools adequate  Darcel Smalling, NP

## 2012-09-23 LAB — HEMOGLOBIN A1C: Hemoglobin A1C: 6.1 % — ABNORMAL HIGH (ref 4.0–6.0)

## 2012-09-27 ENCOUNTER — Telehealth: Payer: Self-pay | Admitting: Internal Medicine

## 2012-09-27 NOTE — Telephone Encounter (Signed)
Using [employer] Spanish interpreter [phone], unable to reach pt at home number.  Left message for pt to call back on other number.

## 2012-09-27 NOTE — Telephone Encounter (Signed)
Message copied by Leighton Parody on Tue Sep 27, 2012 11:14 AM  ------       Message from: Darcel Smalling       Created: Mon Sep 26, 2012  8:15 AM         Please call patient he continues to have an elevated PSA which in 2012 he was seen by urology.  His creatinine is stable his a1c is 6.1 not diabetic but not normal just mildly elevated he should monitor his diet. he speaks only spanish  ------

## 2012-09-27 NOTE — Telephone Encounter (Signed)
Reached pt wife using the [employer] interpreter phone, interpreter# [phone].    Pt is not available until 5:30 pm.  He leaves for work at 7:15 am.  She agrees to have pt call us back tomorrow on his break.  He will ask for interpreter when he calls [place] back.   Wife thinks this will be between 10 and 12 noon.    Advised to ask for [place].   Wife states there is someone at the house that can read [place].  Will also mail a letter.

## 2012-09-29 NOTE — Telephone Encounter (Signed)
Reviewed lab results per below with patient via spanish interpreter.   Message from: Darcel Smalling   Created: Mon Sep 26, 2012 8:15 AM   Please call patient he continues to have an elevated PSA which in 2012 he was seen by urology. His creatinine is stable his a1c is 6.1 not diabetic but not normal just mildly elevated he should monitor his diet.  ------      He did not have any questions at this time

## 2012-11-08 ENCOUNTER — Encounter: Payer: Self-pay | Admitting: Urology

## 2012-11-08 ENCOUNTER — Ambulatory Visit: Payer: Self-pay

## 2012-11-08 ENCOUNTER — Ambulatory Visit: Payer: Self-pay | Admitting: Urology

## 2012-11-08 VITALS — BP 126/72 | HR 68 | Resp 16 | Wt 162.0 lb

## 2012-11-08 DIAGNOSIS — R972 Elevated prostate specific antigen [PSA]: Secondary | ICD-10-CM

## 2012-11-08 DIAGNOSIS — Z1389 Encounter for screening for other disorder: Secondary | ICD-10-CM

## 2012-11-08 DIAGNOSIS — N138 Other obstructive and reflux uropathy: Secondary | ICD-10-CM

## 2012-11-08 DIAGNOSIS — N401 Enlarged prostate with lower urinary tract symptoms: Secondary | ICD-10-CM

## 2012-11-08 LAB — POCT SIEMENS URINALYSIS DIPSTICK
Blood,UA POCT: NEGATIVE
Glucose,UA POCT: NEGATIVE
Ketones,UA POCT: NEGATIVE
Kit Lot: 311026
Leukocyte,UA POCT: NEGATIVE
Nitrite,UA POCT: NEGATIVE
pH,UA POCT: 6 (ref 5.0–8.0)

## 2012-11-08 NOTE — Progress Notes (Signed)
Chief complaint: Elevated PSA    History of present illness:This patient was recently noted to have an elevated PSA to 7.34. He is doing well. He does have BPH and related lower urinary tract symptoms. He has been on tamsulosin which is helping his symptoms. His prostate biopsy in 2012 was unremarkable for prostate cancer.    His PSA is 7.34. Last year this was 6.45. His PSA has been quite variable.    Medications:   Current Outpatient Prescriptions   Medication   . omeprazole (PRILOSEC) 20 MG capsule   . lisinopril (PRINIVIL,ZESTRIL) 20 MG tablet   . omeprazole (PRILOSEC) 20 MG capsule   . tamsulosin (FLOMAX) 0.4 MG   . triamcinolone (KENALOG) 0.1 % ointment     No current facility-administered medications for this visit.       Allergies:   Allergies   Allergen Reactions   . Tetanus Toxoids      On Amb Int Med note from 08/20/09   . No Known Latex Allergy        Past medical history:   Past Medical History   Diagnosis Date   . BPH (benign prostatic hypertrophy)    . Kidney cyst    . Esophageal reflux    . Hypertension    . Hyperlipidemia    . Hypoglycemia    . Fibrolipoma      intramuscular adipose tissue   . Rotator cuff tendinitis      Right   . Sexual dysfunction      absent ejaculation       Past surgical history:   Past Surgical History   Procedure Laterality Date   . Leg tendon surgery       Right   . Knee surgery       left       Family history:   Family History   Problem Relation Age of Onset   . Heart disease Mother    . Diabetes Mother    . Diabetes Father    . Stroke Father        Social History:   History     Social History   . Marital Status: Married     Spouse Name: N/A     Number of Children: N/A   . Years of Education: N/A     Occupational History   . Not on file.     Social History Main Topics   . Smoking status: Never Smoker    . Smokeless tobacco: Never Used   . Alcohol Use: No   . Drug Use: No   . Sexually Active: Not on file     Other Topics Concern   . Not on file     Social History Narrative    . No narrative on file       REVIEW of SYSTEMS    Constitutional: Negative.  HEENT: Negative  Respiratory: Negative  Cardiac: Negative  GI: Negative  GU: See above.  Musculoskeletal: Negative  Neurological: Negative  Hematological: Negative  Behavorial: Negative  Skin: Negative  Endocrine:Negative  Vascular:Negative    Vital signs: Blood pressure 126/72, pulse 68, resp. rate 16, weight 73.483 kg (162 lb).    Urine analysis shows :   Recent Results (from the past 24 hour(s))   POCT SIEMENS URINALYSIS DIPSTICK    Collection Time     11/08/12  4:01 PM       Result Value Range    Glucose,UA POCT Negative  Negative  Ketones,UA POCT Negative  Negative    Specific gravity,UA POCT N/A  1.002 - 1.030    Blood,UA POCT Negative  Negative    pH,UA POCT 6.0  5.0 - 8.0    Protien,UA POCT Trace (*) Negative mg/dL    Nitrite,UA POCT Negative  Negative    Leukocyte,UA POCT Negative  Negative    Expiration Date 07/2013      Kit Lot [id number]           PHYSICAL EXAMINATION    Constitutional: Alert, well-developed.    Abdominal examination: Soft, bowel sounds positive.  Nontender-nondistended. There is no evidence of CVA tenderness. There is no evidence of a mass.  There is no evidence of a hernia. There is no suprapubic fullness or tenderness. There is no evidence of lymphadenopathy at the lymph bearing regions.    GU exam: Normal phallus.  Urethral meatus is normal.  Testes are in the scrotum in their normal position and size.  There is no tenderness.  The Epididymes are normal bilaterally.    Rectal examination: Good anal tone.  There is no evidence of hemorrhoids or rectal lesions.  Prostate is approximately 40-50 g and smooth without tenderness.      Radiologic evaluation included: none    Assessment:Elevated PSA with normal PSA velocity. BPH treated with tamsulosin.    Plan:Findings and options were reviewed. It was elected to defer biopsy at this time. PSA will be obtained in 6 months.      Unk Pinto, MD 11/08/2012 4:11 PM

## 2012-11-10 ENCOUNTER — Telehealth: Payer: Self-pay | Admitting: Internal Medicine

## 2012-11-10 DIAGNOSIS — Z0000 Encounter for general adult medical examination without abnormal findings: Secondary | ICD-10-CM

## 2012-11-10 NOTE — Telephone Encounter (Signed)
Colonoscopy:   Overdue?   Yes-         Agrees to schedule?   yes    Mammogram:   Overdue?   N/A

## 2012-12-23 ENCOUNTER — Ambulatory Visit: Payer: Self-pay

## 2012-12-23 ENCOUNTER — Encounter: Payer: Self-pay | Admitting: Psychiatry

## 2012-12-23 ENCOUNTER — Ambulatory Visit: Payer: Self-pay | Admitting: Psychiatry

## 2012-12-23 VITALS — BP 130/78 | HR 68 | Temp 97.3°F | Ht 68.9 in | Wt 165.8 lb

## 2012-12-23 DIAGNOSIS — E785 Hyperlipidemia, unspecified: Secondary | ICD-10-CM

## 2012-12-23 DIAGNOSIS — Z0000 Encounter for general adult medical examination without abnormal findings: Secondary | ICD-10-CM

## 2012-12-23 MED ORDER — ATORVASTATIN CALCIUM 20 MG PO TABS *I*
20.0000 mg | ORAL_TABLET | Freq: Every day | ORAL | Status: DC
Start: 2012-12-23 — End: 2013-06-13

## 2012-12-23 NOTE — Patient Instructions (Addendum)
Iniciar 20mg  de atorvastatina al da durante su colesterol.     Controle su presin arterial en casa. Llame si se est ejecutando constantemente por encima de 140/90.     Haga que su vacuna contra la gripe en su salida hoy.

## 2012-12-23 NOTE — Progress Notes (Addendum)
[facility] Progress Note    Reason For Visit: Routine follow-up    Subjective:      Tristan Mcbride is 67 y.o. year old male with a PMH significant for GERD, HTN, BPH who presents to [facility] clinic today for a routine follow-up appointment.    Patient reports that he is doing very well without any acute complaints today.  His symptoms of BPH and GERD are well under control with his medications.  No recent fever/chills, nausea/vomiting/diarrhea, CP, SOB, LEE, melena / hematochezia.    Home Medications:     Prior to Admission medications    Medication Sig Start Date End Date Taking? Authorizing Provider   omeprazole (PRILOSEC) 20 MG capsule Take 1 capsule (20 mg total) by mouth daily (before breakfast) 09/22/12   Deveau, Durene Cal L, NP   lisinopril (PRINIVIL,ZESTRIL) 20 MG tablet Take 1 tablet (20 mg total) by mouth daily 09/22/12 03/21/13  Deveau, Monico Blitz, NP   omeprazole (PRILOSEC) 20 MG capsule Take 1 capsule (20 mg total) by mouth daily (before breakfast) 09/22/12   Deveau, Durene Cal L, NP   tamsulosin (FLOMAX) 0.4 MG Take 1 capsule (0.4 mg total) by mouth at bedtime 09/22/12   Deveau, Durene Cal L, NP   triamcinolone (KENALOG) 0.1 % ointment Apply topically 2 times daily 09/22/12   Deveau, Monico Blitz, NP         Allergies:     Allergies   Allergen Reactions   . Tetanus Toxoids      On Amb Int Med note from 08/20/09   . No Known Latex Allergy      Review of Systems:   Otherwise negative unless stated in Subjective  PHYSICAL EXAM:  Temp Readings from Last 3 Encounters:   09/22/12 36.4 C (97.5 F)    12/03/11 36.7 C (98.1 F) Temporal   11/20/10 36.9 C (98.4 F)      BP Readings from Last 3 Encounters:   11/08/12 126/72   09/22/12 140/80   12/03/11 124/88     Pulse Readings from Last 3 Encounters:   11/08/12 68   09/22/12 66   12/03/11 80       There were no vitals filed for this visit.  Wt Readings from Last 3 Encounters:   11/08/12 73.483 kg (162 lb)   09/22/12 73.846 kg (162 lb 12.8 oz)   12/03/11 73.982 kg (163 lb 1.6 oz)        General: well developed male, NAD  HEENT: anicteric, MMM, OP clear, no adenopathy  Lungs: CTAB no w/r/r  Heart: RRR no m/r/g  Abdomen: soft, NT, ND, +BS  Extremities: no edema, no rashes  Neurologic: grossly normal    ASSESSMENT and Plan:     67 y.o. year old male with a PMH significant for GERD, HTN, BPH who presents to [facility] clinic today for a routine follow-up appointment.    HTN: Repeat BP was 130/78 today.  Patient reports good compliance with his lisinopril  - Continue lisinopril 20mg  daily  - Patient will monitor BP at home and call if pressure is running consistently over 140/90    CVD primary prevention:  10 year CVD risk >10%.  Will initiate statin therapy  - Start atorvastatin 20mg  daily  - Side effects reviewed    Prediabetes: Last HA1C at 6.1%  - Dietary/lifestyle modifications reviewed  - F/U repeat HA1C in 6 months    Health maintenance  - Flu shot administred this visit  - Screen colonoscopy scheduled for October  [facility] Hypertension Plan    Based on this patient's clinical history and according to JNC guidelines target BP goal is: less than 140/90  Based on the patient's last BP of BP: 130/78 mmHg the patient is: at goal  The plan to reach goal   1.  Reviewed pt's understanding of medications including any barriers to adherence.   2.  Recommended lifestyle modifications: DASH eating plan and medication compliance   3.  The patient understands their clinical goals and will undertake self-management recommendations including:all     4.  Medication Management: N/A diet only    5.  Referral to Care Management:: No   6.  Patient Ed/Self-management tools provided: Current self-management tools adequate      Case discussed with attending physician Dr. Henrene Hawking, MD 12/23/2012 8:30 AM  [facility]     [facility] Faculty Attestation    I discussed the patient with the above resident physician at the time of the visit.  I agree with his assessment and plan of  care as documented.    Covered:  Controlled HTN, GERD and BPH.  CKD2.  CAD risk requiring statin Rx.  Glucose Intolerance.    Andrey Campanile, M.D. 12/29/2012, 3:03 PM

## 2012-12-24 LAB — MICROALBUMIN, URINE, RANDOM
Creatinine,UR: 112 mg/dL (ref 20–300)
Microalb/Creat Ratio: 16.8 mg MA/g CR (ref 0.0–29.9)
Microalbumin,UR: 1.88 mg/dL

## 2013-01-12 ENCOUNTER — Other Ambulatory Visit: Payer: Self-pay | Admitting: Pediatric Gastroenterology

## 2013-03-16 ENCOUNTER — Other Ambulatory Visit: Payer: Self-pay | Admitting: Internal Medicine

## 2013-03-16 NOTE — Telephone Encounter (Signed)
MD is currently unavailable, Rx request routed to [facility] Team PM covering MD for 03/16/2013 for processing/Camdyn Laden .     Requested Prescriptions     Pending Prescriptions Disp Refills   . tamsulosin (FLOMAX) 0.4 MG [Pharmacy Med Name: TAMSULOSIN HCL 0.4 MG CAPSULE] 30 capsule 4     Sig: take 1 capsule by mouth at bedtime

## 2013-04-13 ENCOUNTER — Other Ambulatory Visit: Payer: Self-pay | Admitting: Internal Medicine

## 2013-04-14 ENCOUNTER — Other Ambulatory Visit: Payer: Self-pay | Admitting: Internal Medicine

## 2013-04-14 NOTE — Telephone Encounter (Signed)
Prescription request routed to MD for processing 04/14/2013 3:05 PM     Requested Prescriptions     Pending Prescriptions Disp Refills    omeprazole (PRILOSEC) 20 MG capsule 30 capsule 3     Sig: Take 1 capsule (20 mg total) by mouth daily (before breakfast)

## 2013-04-14 NOTE — Telephone Encounter (Signed)
Prescription request routed to MD for processing 04/14/2013 11:02 AM     Requested Prescriptions     Pending Prescriptions Disp Refills    lisinopril (PRINIVIL,ZESTRIL) 20 MG tablet [Pharmacy Med Name: LISINOPRIL 20 MG TABLET] 30 tablet 5     Sig: take 1 tablet by mouth daily

## 2013-04-15 MED ORDER — OMEPRAZOLE 20 MG PO CPDR *I*
20.0000 mg | DELAYED_RELEASE_CAPSULE | Freq: Every day | ORAL | Status: DC
Start: 2013-04-14 — End: 2013-08-07

## 2013-06-13 ENCOUNTER — Other Ambulatory Visit: Payer: Self-pay | Admitting: Internal Medicine

## 2013-06-13 DIAGNOSIS — E785 Hyperlipidemia, unspecified: Secondary | ICD-10-CM

## 2013-06-13 MED ORDER — ATORVASTATIN CALCIUM 20 MG PO TABS *I*
20.0000 mg | ORAL_TABLET | Freq: Every day | ORAL | Status: DC
Start: 2013-06-13 — End: 2013-11-29

## 2013-06-13 NOTE — Telephone Encounter (Signed)
MD is currently unavailable, Rx request routed to [facility] Team PM covering MD for 06/13/2013 for processing/Tristan Mcbride .     Requested Prescriptions     Pending Prescriptions Disp Refills    atorvastatin (LIPITOR) 20 MG tablet 30 tablet 5     Sig: Take 1 tablet (20 mg total) by mouth daily (with dinner)

## 2013-08-06 ENCOUNTER — Other Ambulatory Visit: Payer: Self-pay | Admitting: Primary Care

## 2013-08-07 ENCOUNTER — Other Ambulatory Visit: Payer: Self-pay | Admitting: Internal Medicine

## 2013-08-07 MED ORDER — OMEPRAZOLE 20 MG PO CPDR *I*
20.0000 mg | DELAYED_RELEASE_CAPSULE | Freq: Every day | ORAL | Status: DC
Start: 2013-08-07 — End: 2014-07-19

## 2013-08-07 NOTE — Telephone Encounter (Signed)
Prescription request routed to MD for processing 08/07/2013 10:12 AM     Requested Prescriptions     Pending Prescriptions Disp Refills    tamsulosin (FLOMAX) 0.4 MG [Pharmacy Med Name: TAMSULOSIN HCL 0.4 MG CAPSULE] 30 capsule 4     Sig: take 1 capsule by mouth at bedtime

## 2013-08-07 NOTE — Telephone Encounter (Signed)
Prescription request routed to MD for processing 08/07/2013 2:59 PM     Requested Prescriptions     Pending Prescriptions Disp Refills    omeprazole (PRILOSEC) 20 MG capsule 30 capsule 3     Sig: Take 1 capsule (20 mg total) by mouth daily (before breakfast)

## 2013-10-04 ENCOUNTER — Other Ambulatory Visit: Payer: Self-pay | Admitting: Internal Medicine

## 2013-10-04 NOTE — Telephone Encounter (Signed)
Rx request routed to provider on 10/04/2013 at 4:02 PM

## 2013-10-05 MED ORDER — LISINOPRIL 20 MG PO TABS *I*
20.0000 mg | ORAL_TABLET | Freq: Every day | ORAL | Status: DC
Start: 2013-10-04 — End: 2014-03-20

## 2013-10-31 ENCOUNTER — Telehealth: Payer: Self-pay

## 2013-11-02 NOTE — Telephone Encounter (Signed)
Called pt three times and no answer

## 2013-11-27 ENCOUNTER — Ambulatory Visit: Payer: Self-pay | Admitting: Hematology & Oncology

## 2013-11-27 ENCOUNTER — Encounter: Payer: Self-pay | Admitting: Hematology & Oncology

## 2013-11-27 ENCOUNTER — Ambulatory Visit: Payer: Self-pay

## 2013-11-27 VITALS — BP 132/78 | HR 52 | Temp 97.2°F | Ht 68.9 in | Wt 167.7 lb

## 2013-11-27 DIAGNOSIS — R21 Rash and other nonspecific skin eruption: Secondary | ICD-10-CM

## 2013-11-27 DIAGNOSIS — E7800 Pure hypercholesterolemia, unspecified: Secondary | ICD-10-CM

## 2013-11-27 DIAGNOSIS — N400 Enlarged prostate without lower urinary tract symptoms: Secondary | ICD-10-CM

## 2013-11-27 DIAGNOSIS — R7303 Prediabetes: Secondary | ICD-10-CM

## 2013-11-27 DIAGNOSIS — E785 Hyperlipidemia, unspecified: Secondary | ICD-10-CM

## 2013-11-27 DIAGNOSIS — Z800 Family history of malignant neoplasm of digestive organs: Secondary | ICD-10-CM

## 2013-11-27 LAB — BASIC METABOLIC PANEL
Anion Gap: 14 (ref 7–16)
CO2: 23 mmol/L (ref 20–28)
Calcium: 9.1 mg/dL (ref 8.6–10.2)
Chloride: 104 mmol/L (ref 96–108)
Creatinine: 1.4 mg/dL — ABNORMAL HIGH (ref 0.67–1.17)
GFR,Black: 60 *
GFR,Caucasian: 52 * — AB
Glucose: 94 mg/dL (ref 60–99)
Lab: 24 mg/dL — ABNORMAL HIGH (ref 6–20)
Potassium: 4.3 mmol/L (ref 3.3–5.1)
Sodium: 141 mmol/L (ref 133–145)

## 2013-11-27 LAB — LIPID PANEL
Chol/HDL Ratio: 3.1
Cholesterol: 144 mg/dL
HDL: 47 mg/dL
LDL Calculated: 80 mg/dL
Non HDL Cholesterol: 97 mg/dL
Triglycerides: 85 mg/dL

## 2013-11-27 LAB — PSA (EFF.4-2010): PSA (eff. 4-2010): 7.91 ng/mL — ABNORMAL HIGH (ref 0.00–4.00)

## 2013-11-27 MED ORDER — TRIAMCINOLONE ACETONIDE 0.1 % EX CREA *I*
TOPICAL_CREAM | Freq: Two times a day (BID) | CUTANEOUS | Status: DC
Start: 2013-11-27 — End: 2015-01-06

## 2013-11-27 NOTE — Progress Notes (Signed)
[facility] Progress Note    Reason For Visit: No chief complaint on file.      Subjective:      Tristan Mcbride is 68 y.o. year old male with PMH of GERN, HTN, BPH, HLD and prediabetes. His main complaint today is that he has a new rash on his right lower leg. It has persisted for several months. It consist of multiple round lesions that he says are itchy. He has tried using OTC steroid cream without relief. He has been compliant on his statin and BP medications. Pt also reports that he has not had colonoscopy for several years.    Medications:     Current Outpatient Prescriptions on File Prior to Visit   Medication Sig Dispense Refill    lisinopril (PRINIVIL,ZESTRIL) 20 MG tablet Take 1 tablet (20 mg total) by mouth daily 30 tablet 5    tamsulosin (FLOMAX) 0.4 MG take 1 capsule by mouth at bedtime 30 capsule 4    omeprazole (PRILOSEC) 20 MG capsule Take 1 capsule (20 mg total) by mouth daily (before breakfast) 30 capsule 3    atorvastatin (LIPITOR) 20 MG tablet Take 1 tablet (20 mg total) by mouth daily (with dinner) 30 tablet 5    omeprazole (PRILOSEC) 20 MG capsule Take 1 capsule (20 mg total) by mouth daily (before breakfast) 30 capsule 3     No current facility-administered medications on file prior to visit.       Medications reviewed and reconciled.   Allergies:     Allergies   Allergen Reactions    Tetanus Toxoids      On Amb Int Med note from 08/20/09    No Known Latex Allergy        Review of Systems:     Pertinent positives and negatives as per HPI.     Physical Exam:     Filed Vitals:    11/27/13 1049   BP: 132/78   Pulse: 52   Temp: 36.2 C (97.2 F)   TempSrc: Temporal   Height: 1.75 m (5' 8.9")   Weight: 76.064 kg (167 lb 11 oz)     Wt Readings from Last 3 Encounters:   11/27/13 76.064 kg (167 lb 11 oz)   12/23/12 75.206 kg (165 lb 12.8 oz)   11/08/12 73.483 kg (162 lb)     BP Readings from Last 3 Encounters:   11/27/13 132/78   12/23/12 130/78   11/08/12 126/72       General: In no  apparent distress.  See VS.    HEENT: Eyes- PERRL. Throat- No exudates.  Neck:  No LAD.  Pulmonary: CTA B/L. No wheezes, rhonchi, or rales.  Cardiovascular: RRR. No murmurs, rubs, or gallops. No pedal edema.  Abdominal: Soft. Non-distended. Non-tender. Bowel sounds present.  Skin: No wounds on feet.    Assessment and Plan:     Tristan Mcbride is 68 y.o. year old male with PMH of GERN, HTN, BPH, HLD and prediabetes. His main complaint today is that he has a new rash on his right lower leg. It has persisted for several months. It consist of multiple round lesions that he says are itchy. He has tried using OTC steroid cream without relief. He has been compliant on his statin and BP medications. Pt also reports that he has not had colonoscopy for several years.    Rash and nonspecific skin eruption  -    triamcinolone (KENALOG) 0.1 % cream; Apply topically 2 times daily   -  Refer to dermatology if rash does not improve    Hyperlipidemia  - Lipid panel  - Basic metabolic panel    Family hx of colon cancer requiring screening colonoscopy  - AMB REFERRAL TO GASTROENTEROLOGY  -    Per GI note pt is to have colonoscopy every 5 years for FH of colon cancer.    BPH (benign prostatic hyperplasia)  - PSA (eff.07-2008); Future  - AMB REFERRAL TO UROLOGY    Hypercholesteremia  -     On statin  -     Lipid panel today      Prediabetes  - POCT Hemoglobin A1C      PCMH Hypertension Plan    Based on this patients clinical history and according to [place] guidelines target BP goal is: less than 140/90 (or 150/90 due to age [facility]))  Based on the patient's last BP of BP: 132/78 mmHg the patient is: at goal  The plan to reach goal   1.  Reviewed pt's understanding of medications including any barriers to adherence.   2.  Recommended lifestyle modifications: medication compliance   3.  The patient understands their clinical goals and will undertake self-management recommendations including:medication compliance     4.  Medication Management: no  changes made    5.  Referral to Care Management:: No   6.  Patient Ed/Self-management tools provided: No    Follow up in 6 months for routine visit.     Bebe Shaggy, MD 11/27/2013 11:57 AM    Patient seen and care discussed with resident at time of visit.  Agree with plans regarding stasis dermatitis, lipids, colonoscopy.    Electronically signed by Jake Samples. Micki Cassel, MD 11/27/2013 at 4:29 PM

## 2013-11-29 ENCOUNTER — Other Ambulatory Visit: Payer: Self-pay | Admitting: Hematology & Oncology

## 2013-11-29 DIAGNOSIS — E785 Hyperlipidemia, unspecified: Secondary | ICD-10-CM

## 2013-11-29 MED ORDER — ATORVASTATIN CALCIUM 20 MG PO TABS *I*
20.0000 mg | ORAL_TABLET | Freq: Every day | ORAL | Status: DC
Start: 2013-11-29 — End: 2014-05-19

## 2013-11-29 MED ORDER — OMEPRAZOLE 20 MG PO CPDR *I*
20.0000 mg | DELAYED_RELEASE_CAPSULE | Freq: Every day | ORAL | Status: DC
Start: 2013-11-29 — End: 2014-03-19

## 2013-11-29 NOTE — Telephone Encounter (Signed)
Rx request routed to provider on 11/29/2013 at 9:04 AM

## 2013-12-28 ENCOUNTER — Other Ambulatory Visit: Payer: Self-pay | Admitting: Hematology & Oncology

## 2013-12-28 MED ORDER — TAMSULOSIN HCL 0.4 MG PO CAPS *I*
0.4000 mg | ORAL_CAPSULE | Freq: Every evening | ORAL | Status: DC
Start: 2013-12-28 — End: 2014-05-19

## 2013-12-28 NOTE — Telephone Encounter (Signed)
Resident MD unavailable, routed to today's [facility] for processing 12/28/2013 9:12 AM

## 2014-03-19 ENCOUNTER — Encounter: Payer: Self-pay | Admitting: Internal Medicine

## 2014-03-19 ENCOUNTER — Other Ambulatory Visit: Payer: Self-pay | Admitting: Hematology & Oncology

## 2014-03-19 NOTE — Telephone Encounter (Signed)
Rx request routed to provider on 03/19/2014 at 10:06 AM

## 2014-03-20 ENCOUNTER — Other Ambulatory Visit: Payer: Self-pay | Admitting: Hematology & Oncology

## 2014-03-20 MED ORDER — LISINOPRIL 20 MG PO TABS *I*
20.0000 mg | ORAL_TABLET | Freq: Every day | ORAL | Status: DC
Start: 2014-03-20 — End: 2014-09-18

## 2014-03-20 NOTE — Telephone Encounter (Signed)
Rx request routed to provider on 03/20/2014 at 1:57 PM

## 2014-05-07 ENCOUNTER — Ambulatory Visit: Payer: Self-pay | Admitting: Hematology & Oncology

## 2014-05-19 ENCOUNTER — Other Ambulatory Visit: Payer: Self-pay | Admitting: Hematology & Oncology

## 2014-05-19 ENCOUNTER — Other Ambulatory Visit: Payer: Self-pay | Admitting: Internal Medicine

## 2014-05-21 NOTE — Telephone Encounter (Signed)
Rx request routed to provider on 05/21/2014 at 10:26 AM

## 2014-07-19 ENCOUNTER — Other Ambulatory Visit: Payer: Self-pay | Admitting: Hematology & Oncology

## 2014-07-19 NOTE — Telephone Encounter (Signed)
MD unavailable, Rx request routed to covering [facility] team resident for 07/19/2014

## 2014-09-18 ENCOUNTER — Other Ambulatory Visit: Payer: Self-pay | Admitting: Hematology & Oncology

## 2014-09-18 NOTE — Telephone Encounter (Signed)
Rx request routed to provider on 09/18/2014 at 10:32 AM

## 2014-10-15 ENCOUNTER — Other Ambulatory Visit: Payer: Self-pay | Admitting: Hematology & Oncology

## 2014-10-16 NOTE — Telephone Encounter (Signed)
Rx request routed to provider on 10/16/2014 at 10:18 AM

## 2014-10-31 ENCOUNTER — Ambulatory Visit: Payer: Self-pay

## 2014-10-31 ENCOUNTER — Ambulatory Visit: Payer: Self-pay | Admitting: Hematology & Oncology

## 2014-10-31 ENCOUNTER — Encounter: Payer: Self-pay | Admitting: Hematology & Oncology

## 2014-10-31 VITALS — BP 128/76 | HR 60 | Temp 97.2°F | Ht 68.9 in | Wt 170.3 lb

## 2014-10-31 DIAGNOSIS — Z0000 Encounter for general adult medical examination without abnormal findings: Secondary | ICD-10-CM

## 2014-10-31 LAB — CBC AND DIFFERENTIAL
Baso # K/uL: 0.1 10*3/uL (ref 0.0–0.1)
Basophil %: 0.9 %
Eos # K/uL: 0.2 10*3/uL (ref 0.0–0.5)
Eosinophil %: 2.2 %
Hematocrit: 42 % (ref 40–51)
Hemoglobin: 13.7 g/dL (ref 13.7–17.5)
IMM Granulocytes #: 0 10*3/uL (ref 0.0–0.1)
IMM Granulocytes: 0.2 %
Lymph # K/uL: 2.8 10*3/uL (ref 1.3–3.6)
Lymphocyte %: 33.6 %
MCH: 27 pg/cell (ref 26–32)
MCHC: 32 g/dL (ref 32–37)
MCV: 84 fL (ref 79–92)
Mono # K/uL: 0.6 10*3/uL (ref 0.3–0.8)
Monocyte %: 7.5 %
Neut # K/uL: 4.6 10*3/uL (ref 1.8–5.4)
Nucl RBC # K/uL: 0 10*3/uL (ref 0.0–0.0)
Nucl RBC %: 0 /100 WBC (ref 0.0–0.2)
Platelets: 209 10*3/uL (ref 150–330)
RBC: 5 MIL/uL (ref 4.6–6.1)
RDW: 13.9 % (ref 11.6–14.4)
Seg Neut %: 55.6 %
WBC: 8.2 10*3/uL (ref 4.2–9.1)

## 2014-10-31 LAB — BASIC METABOLIC PANEL
Anion Gap: 11 (ref 7–16)
CO2: 27 mmol/L (ref 20–28)
Calcium: 9.3 mg/dL (ref 8.6–10.2)
Chloride: 103 mmol/L (ref 96–108)
Creatinine: 1.26 mg/dL — ABNORMAL HIGH (ref 0.67–1.17)
GFR,Black: 68 *
GFR,Caucasian: 59 * — AB
Glucose: 112 mg/dL — ABNORMAL HIGH (ref 60–99)
Lab: 19 mg/dL (ref 6–20)
Potassium: 4.7 mmol/L (ref 3.3–5.1)
Sodium: 141 mmol/L (ref 133–145)

## 2014-10-31 LAB — HEMOGLOBIN A1C: Hemoglobin A1C: 6.4 % — ABNORMAL HIGH (ref 4.0–6.0)

## 2014-10-31 MED ORDER — PNEUMOCOCCAL 13-VAL CONJ VACC IM SUSP *I*
0.5000 mL | Freq: Once | INTRAMUSCULAR | 0 refills | Status: AC
Start: 2014-10-31 — End: 2014-10-31

## 2014-10-31 MED ORDER — ZOSTER VACCINE LIVE 19400 UNT/0.65ML SC SOLR *I*
19400.0000 [IU] | Freq: Once | SUBCUTANEOUS | 0 refills | Status: AC
Start: 2014-10-31 — End: 2014-10-31

## 2014-10-31 NOTE — Progress Notes (Signed)
[facility] Progress Note    Reason For Visit: No chief complaint on file.      Subjective:      Tristan Mcbride is 69 y.o. year old male with PMH of GERN, HTN, BPH, HLD and prediabetes. He has no complaints today. He has been taking his medications. He is willing to undergo a colonoscopy. He does not want to undergo a repeat prostate biopsy.     Medications:     Current Outpatient Prescriptions on File Prior to Visit   Medication Sig Dispense Refill    tamsulosin (FLOMAX) 0.4 MG take 1 capsule by mouth NIGHTLY 30 capsule 4    lisinopril (PRINIVIL,ZESTRIL) 20 MG tablet take 1 tablet by mouth once daily 30 tablet 5    omeprazole (PRILOSEC) 20 MG capsule take 1 capsule by mouth once daily BEFORE BREAKFAST 30 capsule 3    atorvastatin (LIPITOR) 20 MG tablet take 1 tablet by mouth once daily with dinner 30 tablet 5    triamcinolone (KENALOG) 0.1 % cream Apply topically 2 times daily 15 g 5     No current facility-administered medications on file prior to visit.        Medications reviewed and reconciled.   Allergies:     Allergies   Allergen Reactions    Tetanus Toxoids      On Amb Int Med note from 08/20/09    No Known Latex Allergy        Review of Systems:     Pertinent positives and negatives as per HPI.     Physical Exam:     Vitals:    10/31/14 0837   BP: 128/76   Pulse: 60   Temp: 36.2 C (97.2 F)   Weight: 77.2 kg (170 lb 4.8 oz)   Height: 1.75 m (5' 8.9")     Wt Readings from Last 3 Encounters:   10/31/14 77.2 kg (170 lb 4.8 oz)   11/27/13 76.1 kg (167 lb 11 oz)   12/23/12 75.2 kg (165 lb 12.8 oz)     BP Readings from Last 3 Encounters:   10/31/14 128/76   11/27/13 132/78   12/23/12 130/78       General: In no apparent distress.  See VS.    HEENT: Eyes- PERRL. Throat- No exudates.  Neck:  No LAD.  Pulmonary: CTA B/L. No wheezes, rhonchi, or rales.  Cardiovascular: RRR. No murmurs, rubs, or gallops. No pedal edema.  Abdominal: Soft. Non-distended. Non-tender. Bowel sounds present.  Skin: No wounds  on feet.    Assessment and Plan:     Tristan Mcbride is 69 y.o. year old male with PMH of GERN, HTN, BPH, HLD and prediabetes. He presents for routine follow up.    HTN   -Well controlled on lisinopril  -Check BMP today    Family hx of colon cancer requiring screening colonoscopy  -AMB REFERRAL TO GASTROENTEROLOGY  -Per GI note pt is to have colonoscopy every 5 years for FH of colon cancer.    BPH (benign prostatic hyperplasia)  -Last PSA was 7.6. Patient does not want to pursue additional bx at this time  -Continue tamsulosin    Hypercholesteremia  -Continue atorvastatin    Prediabetes  -Hemoglobin A1C, urine micro albumin    Health Care Maintenance  -Zoster and prevnar ordered    PCMH Hypertension Plan    Based on this patients clinical history and according to [place] guidelines target BP goal is: less than 140/90 (or 150/90 due to  age [facility]))  Based on the patient's last BP of BP: 128/76 the patient is: at goal  The plan to reach goal   1.  Reviewed pt's understanding of medications including any barriers to adherence.   2.  Recommended lifestyle modifications: medication compliance   3.  The patient understands their clinical goals and will undertake self-management recommendations including:medication compliance     4.  Medication Management: no changes made    5.  Referral to Care Management:: No   6.  Patient Ed/Self-management tools provided: No    Follow up in 6 months for routine visit.     Bebe Shaggy, MD 10/31/2014 8:41 AM

## 2014-11-01 ENCOUNTER — Encounter: Payer: Self-pay | Admitting: Internal Medicine

## 2014-11-01 LAB — MICROALBUMIN, URINE, RANDOM
Creatinine,UR: 87 mg/dL (ref 20–300)
Microalb/Creat Ratio: 11.1 mg MA/g CR (ref 0.0–29.9)
Microalbumin,UR: 0.97 mg/dL

## 2014-11-09 ENCOUNTER — Telehealth: Payer: Self-pay

## 2014-11-09 NOTE — Telephone Encounter (Signed)
Called pt and left message

## 2014-11-09 NOTE — Telephone Encounter (Signed)
Tristan Mcbride has called to schedule her COD. The patient wants to be scheduled at [facility] , no schedules available. Please call the patient back with a spanish interpreter. The patient can be reached if necessary at [phone].      1.  Is the patient taking any prescribed blood thinners? no      2.  Is the patient on any type of Kidney Dialysis? no    3.  Does the patient have any type of cardiac device or a defibrillator? no    4.  Does the patient use a machine to help you breathe at night? no    5.  Is the patients weight greater than 350lbs? no         6.  Is the patient Diabetic? no

## 2014-11-12 ENCOUNTER — Other Ambulatory Visit: Payer: Self-pay | Admitting: Hematology & Oncology

## 2014-11-12 NOTE — Telephone Encounter (Signed)
Patient returning call and can be reached at [phone]. Please call back with a Spanish Interpreter

## 2014-11-12 NOTE — Telephone Encounter (Signed)
Rx request routed to provider on 11/12/2014 at 10:03 AM

## 2014-11-13 NOTE — Telephone Encounter (Signed)
Patient returning call and can be reached at [phone]. Please call back with a Spanish Interpreter

## 2014-11-13 NOTE — Telephone Encounter (Signed)
Called pt and left message

## 2014-11-14 NOTE — Telephone Encounter (Signed)
Called pt and left message

## 2014-11-20 NOTE — Telephone Encounter (Signed)
Tristan Mcbride is returning a call from [facility]. He states this is regarding scheduling a COB. He is requesting a call back at [phone].     May need a Spanish interpreter.

## 2014-12-24 ENCOUNTER — Other Ambulatory Visit: Payer: Self-pay | Admitting: Student in an Organized Health Care Education/Training Program

## 2014-12-24 NOTE — Telephone Encounter (Signed)
Rx request routed to provider on 12/24/2014 at 2:58 PM

## 2015-01-06 ENCOUNTER — Other Ambulatory Visit: Payer: Self-pay | Admitting: Hematology & Oncology

## 2015-01-07 NOTE — Telephone Encounter (Signed)
Rx request routed to provider on 01/07/2015 at 10:10 AM

## 2015-02-20 ENCOUNTER — Telehealth: Payer: Self-pay | Admitting: Gastroenterology

## 2015-02-20 NOTE — Telephone Encounter (Signed)
Confirmed 915am     No interpreter is available for this procedure ...to arrive at 915 .... Please use the blue phone

## 2015-02-20 NOTE — Telephone Encounter (Signed)
Called pt and left message     Per [facility] and to meet scheduling needs pt appt on 11.10 the arrival time needs to be changed to 915am    Pt was asked to call back and confirm

## 2015-02-21 ENCOUNTER — Encounter: Payer: Self-pay | Admitting: Gastroenterology

## 2015-02-21 ENCOUNTER — Other Ambulatory Visit: Payer: Self-pay | Admitting: Gastroenterology

## 2015-02-21 ENCOUNTER — Ambulatory Visit
Admission: RE | Admit: 2015-02-21 | Discharge: 2015-02-21 | Disposition: A | Discharge status: Home / Self Care | Payer: Self-pay | Attending: Gastroenterology | Admitting: Gastroenterology

## 2015-02-21 MED ORDER — SODIUM CHLORIDE 0.9 % IV SOLN WRAPPED *I*
100.0000 mL/h | Status: DC
Start: 2015-02-21 — End: 2015-02-22
  Administered 2015-02-21: 100 mL/h via INTRAVENOUS

## 2015-02-21 MED ORDER — FENTANYL CITRATE 50 MCG/ML IJ SOLN *WRAPPED*
INTRAMUSCULAR | Status: AC | PRN
Start: 2015-02-21 — End: 2015-02-21
  Administered 2015-02-21: 11:00:00 25 ug via INTRAVENOUS
  Administered 2015-02-21: 11:00:00 50 ug via INTRAVENOUS
  Administered 2015-02-21: 11:00:00 25 ug via INTRAVENOUS

## 2015-02-21 MED ORDER — ONDANSETRON HCL 2 MG/ML IV SOLN *I*
4.0000 mg | Freq: Once | INTRAMUSCULAR | Status: AC
Start: 2015-02-21 — End: 2015-02-21
  Administered 2015-02-21: 4 mg via INTRAVENOUS

## 2015-02-21 MED ORDER — MIDAZOLAM HCL 5 MG/5ML IJ SOLN *I*
INTRAMUSCULAR | Status: AC | PRN
Start: 2015-02-21 — End: 2015-02-21
  Administered 2015-02-21: 1 mg via INTRAVENOUS
  Administered 2015-02-21 (×2): 2 mg via INTRAVENOUS

## 2015-02-21 MED ORDER — ONDANSETRON HCL 2 MG/ML IV SOLN *I*
INTRAMUSCULAR | Status: AC
Start: 2015-02-21 — End: 2015-02-21
  Filled 2015-02-21: qty 2

## 2015-02-21 MED ORDER — MIDAZOLAM HCL 5 MG/5ML IJ SOLN *I*
INTRAMUSCULAR | Status: AC
Start: 2015-02-21 — End: 2015-02-21
  Filled 2015-02-21: qty 10

## 2015-02-21 MED ORDER — SIMETHICONE 40 MG/0.6ML PO SUSP WRAPPED *I*
80.0000 mg | Freq: Once | ORAL | Status: AC
Start: 2015-02-21 — End: 2015-02-21
  Administered 2015-02-21: 80 mg via ORAL

## 2015-02-21 MED ORDER — FENTANYL CITRATE 50 MCG/ML IJ SOLN *WRAPPED*
INTRAMUSCULAR | Status: AC
Start: 2015-02-21 — End: 2015-02-21
  Filled 2015-02-21: qty 4

## 2015-02-21 NOTE — Procedures (Signed)
Procedure Report    Colonoscopy Procedure Note   Date of Procedure: 02/21/2015   Referring Physician: Patrici Ranks, MD  Primary Physician: Patrici Ranks, MD        Attending Physician: Levada Dy, MD  Fellow:   None  Indications:  Colorectal cancer screening-average risk  Previous colonoscopy: Unknown  Medications: Fentanyl 100 mcg IV and Midazolam 5 mg IV were administered incrementally over the course of the procedure to achieve an adequate level of conscious sedation.    Scopes: [id number]    Procedure Details: Full disclosures of risks were reviewed with patient as detailed on the consent form. The patient was placed in the left lateral decubitus position and monitored with continuous pulse oximetry, interval blood pressure monitoring and direct observations.   After anorectal examination was performed, the colonoscope was inserted into the rectum and advanced under direct vision to the terminal ileum. The procedure was considered not difficult.  During withdrawal examination, the final quality of the prep was [facility] Bowel Prep Scale:  Right Colon: Grade2- (minor amount of residual staining, small fragments of stool, and/or opaque liquid, but mucosa of colon segment is seen well)  Transverse Colon: Grade 2- (minor amount of residual staining, small fragments of stool, and/or opaque liquid, but mucosa of colon segment is seen well)  Left Colon: Grade 2- (minor amount of residual staining, small fragments of stool, and/or opaque liquid, but mucosa of colon segment is seen well)  A careful inspection was made as the colonoscope was withdrawn. A retroflexed view of the rectum was performed; findings and interventions are described below. The patient was recovered in the GI recovery area.     Scope Times:     Insertion Time: 1032  Cecum Time: 1035  Exit Time: [id number]      Findings:    Anorectal:   Normal tone, no masses  Terminal Ileum:   Normal ileal mucosa  Cecum:   Normal mucosa  Ascending Colon:   Normal  mucosa  Transverse Colon:   Normal mucosa  Descending Colon:   Diverticulosis: scattered  Sigmoid:   Diverticulosis: scattered    Linear erythema ~5cm in length, s/p biopsy    Rectum:   Normal rectum throughout      Intervention(s):      Cytology/Biopsy: Biopsy taken sigmoid.      Complication(s):     none    Impression(s):     Sigmoid erythema   Otherwise normal colonoscopy to the cecum.    Recommendation(s):     Await pathology.   Repeat colonoscopy in 5 years.    Histopathologic Diagnosis:  pending    Levada Dy, MD

## 2015-02-21 NOTE — Preop H&P (Signed)
OUTPATIENT PRE-PROCEDURE H&P    Chief Complaint / Indications for Procedure: CRCS    Past Medical History:     Past Medical History   Diagnosis Date    BPH (benign prostatic hypertrophy)     Esophageal reflux     Fibrolipoma      intramuscular adipose tissue    Hyperlipidemia     Hypertension     Hypoglycemia     Kidney cyst     Rotator cuff tendinitis      Right    Sexual dysfunction      absent ejaculation     Past Surgical History   Procedure Laterality Date    Leg tendon surgery       Right    Knee surgery       left     Family History   Problem Relation Age of Onset    Heart disease Mother     Diabetes Mother     Diabetes Father     Stroke Father      Social History     Social History    Marital status: Married     Spouse name: N/A    Number of children: N/A    Years of education: N/A     Social History Main Topics    Smoking status: Never Smoker    Smokeless tobacco: Never Used    Alcohol use No    Drug use: No    Sexual activity: Not Asked     Other Topics Concern    None     Social History Narrative       Allergies:    Allergies   Allergen Reactions    Tetanus Toxoids      On Amb Int Med note from 08/20/09    No Known Latex Allergy        Medications:  Current Outpatient Prescriptions   Medication    triamcinolone (KENALOG) 0.1 % cream    omeprazole (PRILOSEC) 20 MG capsule    atorvastatin (LIPITOR) 20 MG tablet    tamsulosin (FLOMAX) 0.4 MG    lisinopril (PRINIVIL,ZESTRIL) 20 MG tablet     Current Facility-Administered Medications   Medication Dose Route Frequency    sodium chloride 0.9 % IV  100 mL/hr Intravenous Continuous      Vitals:    02/21/15 0949   BP:    Pulse:    Resp:    Temp:    Weight: 72.1 kg (159 lb)   Height: 175.3 cm (5\' 9" )       ROS:  [id number]    Physical Examination:  Head/Nose/Throat:negative  Lungs:Lungs clear  Cardiovascular:normal S1 and S2  Abdomen: abdomen soft, non-tender, nondistended, normal active bowel sounds, no masses or  organomegaly      Lab Results: All labs in the last 24 hours: No results found for this or any previous visit (from the past 24 hour(s)).    Radiology impressions (last 30 days):  No results found.    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 Updates to Patient's Condition (to be completed by a provider privileged to complete a H&P, following reassessment of the patient by the provider):    Full H&P done today; no updates needed.    II. Procedure Readiness   I have reviewed the patient's H&P and updated condition. By completing and signing this form, I attest that this patient is ready for surgery/procedure.      III. Attestation   I have reviewed the updated information regarding the patient's condition and it is appropriate to proceed with the planned surgery/procedure.    Levada Dy, MD as of 10:19 AM 02/21/2015

## 2015-02-21 NOTE — Discharge Instructions (Signed)
Gastroenterology Unit  Discharge Instructions for Colonoscopy      02/21/2015    11:00 AM    Colonoscopy and Biopsy    Do not drive, operate heavy machinery, drink alcoholic beverages, make important personal or business decisions, or sign legal documents until the next day.      Return to your usual diet   Return to taking your usual medications    Things you may expect:   A small amount of bright red blood in your stool   It may be a few days before you have a bowel movement   You may have cramping, bloating, and feelings of "gas". These feelings should go away as you pass gas. If you still feel uncomfortable, walking around will help to pass the gas.   You were given medication to help you relax during the test. You may feel "fuzzy" and drowsy. Go home and rest for at least 4-6 hours.    You should call your doctor for any of the following:   Bad stomach pain   Fever   Bright red bleeding or clots (This may happen up to 20 days after the test.)   Dizziness or weakness that gets worse or lasts up to 24 hours.   Pain or redness at the IV site    If you have a serious problem after hours, Call [phone] to reach the GI physician on call. If you are unable to reach your doctor, go to the [facility] Emergency Department.    Follow Up Care:   If biopsies were taken during your procedure, we will send you the pathology results within 7-10 days. If you do not receive your pathology results after 10 days please call [phone]   Report will be sent to your primary doctor.    New Prescriptions    No medications on file

## 2015-02-21 NOTE — Progress Notes (Signed)
Patient with distended, hard abdomen post procedure. Complaining of cramping, gas pain 10/10. Dr Scherrie Bateman in to see patient. Rectal tube inserted with no relieve. Patient ambulated several times and attempted to use bathroom. Patient also with nausea and vomiting.  Interpreter phone obtained to assess patient pain. Patient sent for xray. Upon return, patient continues with pain and distended abdomen. Patient out of bed to ambulate with nausea and vomiting.  Zofran given and second rectal tube inserted with good results. Patient feeling much better. Pain 1/10. Discharged using interpreter phone.

## 2015-02-22 LAB — SURGICAL PATHOLOGY

## 2015-03-19 ENCOUNTER — Other Ambulatory Visit: Payer: Self-pay | Admitting: Hematology & Oncology

## 2015-03-19 NOTE — Telephone Encounter (Signed)
Rx request routed to provider on 03/19/2015 at 11:17 AM

## 2015-03-28 NOTE — Progress Notes (Signed)
Discussed results of biopsy via translator.  No abd pain, diarrhe, likely procedure/prep related.  To contact if symptoms.  Favor repeat colon 5 years.

## 2015-04-23 ENCOUNTER — Other Ambulatory Visit: Payer: Self-pay | Admitting: Hematology & Oncology

## 2015-04-23 NOTE — Telephone Encounter (Signed)
MD unavailable, Rx request routed to [facility] for 04/23/2015

## 2015-05-11 ENCOUNTER — Other Ambulatory Visit: Payer: Self-pay | Admitting: Hematology & Oncology

## 2015-06-24 ENCOUNTER — Telehealth: Payer: Self-pay | Admitting: Internal Medicine

## 2015-06-24 NOTE — Telephone Encounter (Signed)
Patient is scheduled to see Dr.Nielson for a follow up visit on 4/19 @8am     Writer was not able to find matching times that work for patient with interpreter.    Patient will need a spanish interpreter for this visit.

## 2015-06-25 NOTE — Telephone Encounter (Signed)
Received e-mail from Interpreter service:  Thank you for your request for Spanish Interpreter Services for patient Tori Grewell on 4.19.17 at [place].  Unfortunately we are not able to fulfill your request as our schedule is completely booked for the time requested.    Spanish interpreter is available between 8:30am and 9:30am or between 11:15am and 12pm. Please let me know within 24 hours if able to accommodate.    If you would like to reschedule, please do so and forward your new request to our department for processing.   Please consider using [employer] as a possible alternative for this appointment.    Left VM with patient regarding new appointment that be able to be with a interpreter.

## 2015-07-31 ENCOUNTER — Ambulatory Visit: Payer: Self-pay | Admitting: Hematology & Oncology

## 2015-08-04 ENCOUNTER — Other Ambulatory Visit: Payer: Self-pay | Admitting: Hematology & Oncology

## 2015-08-05 NOTE — Telephone Encounter (Signed)
Rx request routed to provider on 08/05/2015 at 1:46 PM

## 2015-08-31 ENCOUNTER — Other Ambulatory Visit: Payer: Self-pay | Admitting: Hematology & Oncology

## 2015-09-02 NOTE — Telephone Encounter (Signed)
MD unavailable, Rx request routed to [facility] for 09/02/2015

## 2015-09-30 ENCOUNTER — Other Ambulatory Visit: Payer: Self-pay | Admitting: Primary Care

## 2015-09-30 NOTE — Telephone Encounter (Signed)
Rx request routed to provider on 09/30/2015 at 11:09 AM

## 2015-12-29 ENCOUNTER — Other Ambulatory Visit: Payer: Self-pay | Admitting: Internal Medicine

## 2015-12-30 NOTE — Telephone Encounter (Signed)
Rx request routed to provider on 12/30/2015 at 9:45 AM

## 2017-02-17 ENCOUNTER — Encounter: Payer: Self-pay | Admitting: Internal Medicine

## 2017-02-17 ENCOUNTER — Ambulatory Visit: Payer: Medicare (Managed Care) | Attending: Internal Medicine | Admitting: Internal Medicine

## 2017-02-17 VITALS — BP 184/90 | HR 64 | Temp 98.2°F | Ht 69.02 in | Wt 170.4 lb

## 2017-02-17 DIAGNOSIS — Z23 Encounter for immunization: Secondary | ICD-10-CM | POA: Insufficient documentation

## 2017-02-17 DIAGNOSIS — Z0000 Encounter for general adult medical examination without abnormal findings: Secondary | ICD-10-CM | POA: Insufficient documentation

## 2017-02-17 DIAGNOSIS — E785 Hyperlipidemia, unspecified: Secondary | ICD-10-CM | POA: Insufficient documentation

## 2017-02-17 DIAGNOSIS — E119 Type 2 diabetes mellitus without complications: Secondary | ICD-10-CM | POA: Insufficient documentation

## 2017-02-17 DIAGNOSIS — R7303 Prediabetes: Secondary | ICD-10-CM | POA: Insufficient documentation

## 2017-02-17 MED ORDER — OMEPRAZOLE 20 MG PO CPDR *I*
20.0000 mg | DELAYED_RELEASE_CAPSULE | Freq: Every day | ORAL | 0 refills | Status: DC
Start: 2017-02-17 — End: 2017-04-15

## 2017-02-17 MED ORDER — ATORVASTATIN CALCIUM 20 MG PO TABS *I*
20.0000 mg | ORAL_TABLET | Freq: Every day | ORAL | 5 refills | Status: DC
Start: 2017-02-17 — End: 2017-02-17

## 2017-02-17 MED ORDER — TAMSULOSIN HCL 0.4 MG PO CAPS *I*
0.4000 mg | ORAL_CAPSULE | Freq: Every evening | ORAL | 0 refills | Status: DC
Start: 2017-02-17 — End: 2017-04-15

## 2017-02-17 MED ORDER — LISINOPRIL 20 MG PO TABS *I*
20.0000 mg | ORAL_TABLET | Freq: Every day | ORAL | 5 refills | Status: DC
Start: 2017-02-17 — End: 2017-02-17

## 2017-02-17 MED ORDER — TAMSULOSIN HCL 0.4 MG PO CAPS *I*
0.4000 mg | ORAL_CAPSULE | Freq: Every evening | ORAL | 4 refills | Status: DC
Start: 2017-02-17 — End: 2017-02-17

## 2017-02-17 MED ORDER — OMEPRAZOLE 20 MG PO CPDR *I*
20.0000 mg | DELAYED_RELEASE_CAPSULE | Freq: Every day | ORAL | 5 refills | Status: DC
Start: 2017-02-17 — End: 2017-02-17

## 2017-02-17 MED ORDER — ATORVASTATIN CALCIUM 20 MG PO TABS *I*
20.0000 mg | ORAL_TABLET | Freq: Every day | ORAL | 0 refills | Status: DC
Start: 2017-02-17 — End: 2017-04-15

## 2017-02-17 MED ORDER — LISINOPRIL 20 MG PO TABS *I*
40.0000 mg | ORAL_TABLET | Freq: Every day | ORAL | 0 refills | Status: DC
Start: 2017-02-17 — End: 2017-04-15

## 2017-02-17 MED ORDER — TRIAMCINOLONE ACETONIDE 0.1 % EX CREA *I*
TOPICAL_CREAM | Freq: Two times a day (BID) | CUTANEOUS | 5 refills | Status: DC
Start: 2017-02-17 — End: 2017-02-17

## 2017-02-17 MED ORDER — TRIAMCINOLONE ACETONIDE 0.1 % EX CREA *I*
TOPICAL_CREAM | Freq: Two times a day (BID) | CUTANEOUS | 5 refills | Status: DC
Start: 2017-02-17 — End: 2017-04-15

## 2017-02-17 NOTE — Progress Notes (Signed)
[facility] Progress Note    Reason For Visit: No chief complaint on file.    Subjective   Tristan Mcbride is 71 y.o. man with medical history of HTN, HLD, BPH presenting for follow up    HTN  - not taking his medications at home  - reports poor compliance to low salt diet  - wife reports he eats whatever he wants and eats a lot of sweets  - denies HA, CP    BPH  - reports weak stream  - denies blood in urine, pain w urination  - reports occasional suprapubic pressure    Constipation  - last colonoscopy 2 years ago, 5 year intervals   - denies hematochezia, melena  - reports soft stools, not difficult to pass  - reports normal stool caliber    Lack of insurance coverage  - reports he has Medicare and [place], but is having to pay for his medications out of pocket  - brings medicare card and [employer] card to visit  - as a result has not taken any medication 'for a while'    ROS    Interval Social Updates:  None    Medications reviewed and reconciled.   Allergies reviewed and reconciled     Objective     Vitals:    02/17/17 1559   BP: (!) 184/90   Pulse: 64   Temp: 36.8 C (98.2 F)   TempSrc: Temporal   Weight: 77.3 kg (170 lb 6.4 oz)   Height: 1.753 m (5' 9.02")     Physical Examination:  Gen: Latino elderly man sitting in room with wife and spanish interpreter in NAD  HEENT: NC/AT  CV: RRR; no m/r/g  Resp: CTAB on anterior exam; nonlabored breathing  Neuro: alert, oriented, conversant; gait normal, nonantalgic    Laboratory: Reviewed. Pertinent results as follows:  -None    Imaging:   -None    Assessment   Tristan Mcbride is 71 y.o. man with medical history of HTN, HLD, BPH presenting for follow up.    Due to concerns about insurance, has not had medications 'for a while.' As such, HTN is uncontrolled (to a degree that I increased his monotherapy), BPH is symptomatic (will re-assess w exam and when on flomax). Constipation is a little unclear - maybe related to a very enlarged prostate, but will reassess next time as he  otherwise is without red flags of bleeding, hard stool, change in stool caliber, or loss of weight.     Plan   Active and discussed issues:  Insurance Questions  Has Medicare A, B, D but confused w out of pocket costs  - met w SW at visit, [facility] so [facility] can [facility] concerns (if any)  - will re-start normal pharmacy ([facility]) if no concerns/problems at next visit    HTN uncontrolled  - increase lisinopril from 20->85m daily  - limit salt in diet    BPH symptomatic  - exam next time  - re-start flomax    Constipation unclear etiology, otherwise asymptomatic  - KUB next time if remains so infrequent  - instructed pt to call clinic w concerning sx such as hematochezia, melena, pain w defecation    Chronic issues:    HCM   - Hemoglobin A1c ordered  - BMP ordered  - Lipid ordered    Health Maintenance:  - Colonoscopy- 2021  - Lung Cancer - n/a (nonsmoker)  - Immunizations - flu, prevnar today;  pneumovax in 8 weeks  - AAA screening - n/a  - Hgb A1c - pending    PCMH:  PCMH Hypertension Plan    Based on this patients clinical history and according to [place] guidelines target BP goal is: less than 140/90 (or 150/90 due to age [facility]))  Based on the patient's last BP of BP: (!) 184/90 the patient is: above goal  The plan to reach goal   1.  Reviewed pt's understanding of medications including any barriers to adherence.   2.  Recommended lifestyle modifications: dietary sodium reduction and medication compliance   3.  The patient understands their clinical goals and will undertake self-management recommendations including:all     4.  Medication Management: the following medication changes were made today: lisinopril 61m (from 20).    5.  Referral to Care Management:: No   6.  Patient Ed/Self-management tools provided: No      Follow up:  -6 weeks w me for HTN f/u.     MFlorian Buff MD   Internal Medicine PGY-1  [phone] 02/17/2017 4:30 PM

## 2017-02-17 NOTE — Patient Instructions (Signed)
Limit the salt you eat

## 2017-02-24 ENCOUNTER — Other Ambulatory Visit
Admission: RE | Admit: 2017-02-24 | Discharge: 2017-02-24 | Disposition: A | Discharge status: Home / Self Care | Payer: Medicare (Managed Care) | Source: Ambulatory Visit | Attending: Internal Medicine | Admitting: Internal Medicine

## 2017-02-24 DIAGNOSIS — E785 Hyperlipidemia, unspecified: Secondary | ICD-10-CM | POA: Insufficient documentation

## 2017-02-24 DIAGNOSIS — R7303 Prediabetes: Secondary | ICD-10-CM | POA: Insufficient documentation

## 2017-02-24 LAB — BASIC METABOLIC PANEL
Anion Gap: 13 (ref 7–16)
CO2: 27 mmol/L (ref 20–28)
Calcium: 9.7 mg/dL (ref 8.6–10.2)
Chloride: 102 mmol/L (ref 96–108)
Creatinine: 1.14 mg/dL (ref 0.67–1.17)
GFR,Black: 75 *
GFR,Caucasian: 65 *
Glucose: 129 mg/dL — ABNORMAL HIGH (ref 60–99)
Lab: 14 mg/dL (ref 6–20)
Potassium: 4.5 mmol/L (ref 3.3–5.1)
Sodium: 142 mmol/L (ref 133–145)

## 2017-02-24 LAB — LIPID PANEL
Chol/HDL Ratio: 3
Cholesterol: 153 mg/dL
HDL: 51 mg/dL
LDL Calculated: 91 mg/dL
Non HDL Cholesterol: 102 mg/dL
Triglycerides: 55 mg/dL

## 2017-02-25 LAB — HEMOGLOBIN A1C: Hemoglobin A1C: 6.3 % — ABNORMAL HIGH

## 2017-02-26 ENCOUNTER — Telehealth: Payer: Self-pay

## 2017-02-26 NOTE — Telephone Encounter (Signed)
Left a message for the pt via Spanish interpreter [phone]).  Requested a call back to discuss.  Provided an office call back number.  Will try again at a later time.

## 2017-02-26 NOTE — Telephone Encounter (Signed)
-----   Message from Warrick Parisian sent at 02/25/2017  8:38 AM EST -----      ----- Message -----  From: Florian Buff, MD  Sent: 02/24/2017   5:19 PM  To: [facility] Team    Please call Garett to let her know that he diabetes and kidney numbers look good from our visit. Thanks. Catalina Antigua  ----- Message -----  From: Edi, [facility] [place]  Sent: 02/24/2017  11:32 AM  To: Florian Buff, MD

## 2017-03-01 NOTE — Telephone Encounter (Signed)
Left a second message for the pt via Spanish interpreter [phone]).  Requested a call back to discuss.  Provided an office call back number.  Will try again at a later time.

## 2017-03-02 NOTE — Telephone Encounter (Signed)
Left a third message for the pt via Spanish interpreter [phone]).  Requested a call back to discuss results.  Provided an office call back number.

## 2017-03-03 NOTE — Telephone Encounter (Signed)
After several unsuccessful attempts to contact the pt a letter has been sent.

## 2017-04-15 ENCOUNTER — Ambulatory Visit: Payer: Medicare (Managed Care) | Attending: Internal Medicine | Admitting: Internal Medicine

## 2017-04-15 ENCOUNTER — Encounter: Payer: Self-pay | Admitting: Internal Medicine

## 2017-04-15 VITALS — BP 140/84 | HR 72 | Temp 96.1°F | Ht 69.02 in | Wt 169.7 lb

## 2017-04-15 DIAGNOSIS — Z0000 Encounter for general adult medical examination without abnormal findings: Secondary | ICD-10-CM | POA: Insufficient documentation

## 2017-04-15 DIAGNOSIS — I10 Essential (primary) hypertension: Secondary | ICD-10-CM | POA: Insufficient documentation

## 2017-04-15 LAB — BASIC METABOLIC PANEL
Anion Gap: 10 (ref 7–16)
CO2: 28 mmol/L (ref 20–28)
Calcium: 10.6 mg/dL — ABNORMAL HIGH (ref 8.6–10.2)
Chloride: 102 mmol/L (ref 96–108)
Creatinine: 1.28 mg/dL — ABNORMAL HIGH (ref 0.67–1.17)
GFR,Black: 65 *
GFR,Caucasian: 57 * — AB
Glucose: 97 mg/dL (ref 60–99)
Lab: 19 mg/dL (ref 6–20)
Potassium: 4.5 mmol/L (ref 3.3–5.1)
Sodium: 140 mmol/L (ref 133–145)

## 2017-04-15 MED ORDER — LISINOPRIL 20 MG PO TABS *I*
40.0000 mg | ORAL_TABLET | Freq: Every day | ORAL | 0 refills | Status: DC
Start: 2017-04-15 — End: 2017-07-22

## 2017-04-15 MED ORDER — ATORVASTATIN CALCIUM 20 MG PO TABS *I*
20.0000 mg | ORAL_TABLET | Freq: Every day | ORAL | 0 refills | Status: DC
Start: 2017-04-15 — End: 2017-07-22

## 2017-04-15 MED ORDER — OMEPRAZOLE 20 MG PO CPDR *I*
20.0000 mg | DELAYED_RELEASE_CAPSULE | Freq: Every day | ORAL | 0 refills | Status: DC
Start: 2017-04-15 — End: 2018-07-28

## 2017-04-15 MED ORDER — TAMSULOSIN HCL 0.4 MG PO CAPS *I*
0.4000 mg | ORAL_CAPSULE | Freq: Every evening | ORAL | 0 refills | Status: DC
Start: 2017-04-15 — End: 2017-07-28

## 2017-04-15 MED ORDER — TRIAMCINOLONE ACETONIDE 0.1 % EX CREA *I*
TOPICAL_CREAM | Freq: Two times a day (BID) | CUTANEOUS | 5 refills | Status: DC
Start: 2017-04-15 — End: 2020-01-15

## 2017-04-15 NOTE — Progress Notes (Signed)
[facility] Progress Note    Reason For Visit:   Chief Complaint   Patient presents with    Hypertension     recheck after starting 40mg  lisinopril daily     Subjective   Tristan Mcbride is 72 y.o. man with medical history of GERD, HTN, BPH (w elevated PSA) presenting for HTN f/u visit.    Spanish interpreter numbers - [id number], [phone]    HTN  - doing well, no headahces, CP, SOB  - reports taking lisinopril each morning    BPH   - emptying bladder better than before, now on flomax - happy w this  - remembers years ago (when he had prostate biopsy) that his urinary function was much worse    ROS    Interval Social Updates:  None covered    Medications reviewed and reconciled.   Allergies reviewed and reconciled     Objective     Vitals:    04/15/17 1327   BP: 140/84   Pulse: 72   Temp: 35.6 C (96.1 F)   TempSrc: Temporal   Weight: 77 kg (169 lb 11.2 oz)   Height: 1.753 m (5' 9.02")     Physical Examination:  Gen: Well-appearing Hispanic man with wife at side, in NAD  HEENT: NC/AT; nonicteric sclera  CV: RRR; no m/r/g  Resp: CTAB posteriorly  Neuro: alert, oriented  DRE: enlarged and firm prostate, without nodularity, nontender    Laboratory: Reviewed. Pertinent results as follows:  - Hgb A1c (after last visit w me) - 6.3  - Cr - 1.14 and K - 4.5 also at that time (1 mo ago)  - repeat kidney fn pending today    Imaging:   -None    Assessment   Tristan Mcbride is 72 y.o. man with medical history of GERD, HTN, BPH (w elevated PSA) presenting for HTN f/u visit.    HTN improved in control, will eval BMP for K and Cr now back on lisinopril. Would like to see repeat BP in officein 3 mo for any additional Rx (thiazide) next. BPH symptoms improved on flmomax, to his relief. We discussed (via interpreter) the imperfections of the PSA monitoring, which he went through years ago. He did not remember his prior decision for opting not to follow PSA values going forward and deferred to my decision for following or not. I  instructed him that so long as his DRE remains stable, his urinary symptoms do not worsen, I am ok without subjectiving him to further PSA testing (and likely another biopsy). We discussed the risks with biopsy as well at the visit and he understood that we may have missed cancer in the past with the biopsy. He is understanding of this plan, and is ok with it going forward.    Plan   Active and discussed issues:  HTN at goal <401 systolic but not recent 027 goal, may re-eval at next visit  - continue lisinopril 40mg  daily  - BMP today    BPH hx of elevated PSA, pt deferred further prostate bx (last 2012 - negative for malign) c. 2014  - pt and I deferring PSA monitoring, DRE today was w/o nodularity  - will plan on annual DRE (last today 04/2017) unless pt opts for PSA monitoring    Chronic issues:  GERD continue omeprazole 20mg  daily - revisit if we can taper at next visit  HLD last screened 02/2017; continue atorvastatin 20mg  daily    Health Maintenance:  - Colonoscopy-  2021  - Lung Cancer - never smoker  - Immunizations - shingrix due  - AAA screening - n/a  - Hgb A1c - 6.3 (1 mo ago)    PCMH:  PCMH Hypertension Plan    Based on this patients clinical history and according to [place] guidelines target BP goal is: less than 130/80  Based on the patient's last BP of BP: 140/84 the patient is: above goal  The plan to reach goal   1.  Reviewed pt's understanding of medications including any barriers to adherence.   2.  Recommended lifestyle modifications: medication compliance and n/a   3.  The patient understands their clinical goals and will undertake self-management recommendations including:all     4.  Medication Management: no changes made    5.  Referral to Care Management:: No   6.  Patient Ed/Self-management tools provided: No    Follow up:  -4 mo for HTN/BPH/GERD (?omeprazole taper) f/u.     Florian Buff, MD   Internal Medicine PGY-1  [facility] [phone]  04/15/2017 2:13 PM

## 2017-04-16 ENCOUNTER — Telehealth: Payer: Self-pay

## 2017-04-16 LAB — HEPATITIS C ANTIBODY: Hep C Ab: NEGATIVE

## 2017-04-16 NOTE — Telephone Encounter (Signed)
Was unable to reach pt.  Left a message requesting a call back with interpreter services [phone]).  Provided office phone number and will try again at a later time.

## 2017-04-16 NOTE — Telephone Encounter (Signed)
-----   Message from Sharlett Iles sent at 04/16/2017 10:04 AM EST -----      ----- Message -----  From: Florian Buff, MD  Sent: 04/16/2017   9:39 AM  To: Janace Hoard Team    Please call Kamdyn with help from [place] interpreter to let him know that his liver and kidney numbers look good. Thanks. Catalina Antigua

## 2017-04-19 NOTE — Telephone Encounter (Signed)
[name] (spouse) returned call regarding lab results.      Call back will require Spanish Interpreter.      Tristan Mcbride's contact number is [phone] (

## 2017-04-19 NOTE — Telephone Encounter (Signed)
Spoke to pt's wife, [name], with interpreter services [phone]). Informed [name] that pt's liver and kidney numbers looked good. [name] stated verbal understanding.

## 2017-06-09 ENCOUNTER — Telehealth: Payer: Self-pay | Admitting: Internal Medicine

## 2017-06-09 NOTE — Telephone Encounter (Signed)
Marcene Brawn, the patient's wife, is calling to speak with a nurse in regards to patient having a fowl odor when urinating and showering.    Patient has a appointment set up for 06/14/2017 at 8:30 and Marcene Brawn denied a sooner appointment due to patient's work schedule.    Please return her call at [phone]. Marcene Brawn needs a Patent attorney.

## 2017-06-09 NOTE — Telephone Encounter (Signed)
Writer attempted to contact pt's wife.   Voicemail left with call back number.

## 2017-06-10 NOTE — Telephone Encounter (Signed)
Spoke to Tristan Mcbride --- pt's wife.    States that pt's sx's have improved and he does not need to be seen sooner. --- Has an appt in [place] on 3/4.    Was encouraged to call back if sx's return/worsen before then.  She verbalized understanding.

## 2017-06-14 ENCOUNTER — Encounter: Payer: Self-pay | Admitting: Physician Assistant

## 2017-06-14 ENCOUNTER — Ambulatory Visit: Payer: Medicare (Managed Care) | Attending: Physician Assistant | Admitting: Physician Assistant

## 2017-06-14 VITALS — BP 146/82 | HR 66 | Temp 98.1°F | Ht 69.02 in | Wt 169.6 lb

## 2017-06-14 DIAGNOSIS — N50811 Right testicular pain: Secondary | ICD-10-CM

## 2017-06-14 DIAGNOSIS — Z789 Other specified health status: Secondary | ICD-10-CM | POA: Insufficient documentation

## 2017-06-14 DIAGNOSIS — R8290 Unspecified abnormal findings in urine: Secondary | ICD-10-CM | POA: Insufficient documentation

## 2017-06-14 LAB — CBC AND DIFFERENTIAL
Baso # K/uL: 0.1 10*3/uL (ref 0.0–0.1)
Basophil %: 0.8 %
Eos # K/uL: 0.2 10*3/uL (ref 0.0–0.5)
Eosinophil %: 1.9 %
Hematocrit: 46 % (ref 40–51)
Hemoglobin: 14.5 g/dL (ref 13.7–17.5)
IMM Granulocytes #: 0 10*3/uL
IMM Granulocytes: 0.3 %
Lymph # K/uL: 2.7 10*3/uL (ref 1.3–3.6)
Lymphocyte %: 30.5 %
MCH: 28 pg/cell (ref 26–32)
MCHC: 31 g/dL — ABNORMAL LOW (ref 32–37)
MCV: 88 fL (ref 79–92)
Mono # K/uL: 0.6 10*3/uL (ref 0.3–0.8)
Monocyte %: 7.1 %
Neut # K/uL: 5.3 10*3/uL (ref 1.8–5.4)
Nucl RBC # K/uL: 0 10*3/uL (ref 0.0–0.0)
Nucl RBC %: 0 /100 WBC (ref 0.0–0.2)
Platelets: 232 10*3/uL (ref 150–330)
RBC: 5.3 MIL/uL (ref 4.6–6.1)
RDW: 14 % (ref 11.6–14.4)
Seg Neut %: 59.4 %
WBC: 9 10*3/uL (ref 4.2–9.1)

## 2017-06-14 LAB — BASIC METABOLIC PANEL
Anion Gap: 13 (ref 7–16)
CO2: 25 mmol/L (ref 20–28)
Calcium: 9.9 mg/dL (ref 8.6–10.2)
Chloride: 106 mmol/L (ref 96–108)
Creatinine: 1.41 mg/dL — ABNORMAL HIGH (ref 0.67–1.17)
GFR,Black: 58 * — AB
GFR,Caucasian: 50 * — AB
Glucose: 188 mg/dL — ABNORMAL HIGH (ref 60–99)
Lab: 20 mg/dL (ref 6–20)
Potassium: 4.7 mmol/L (ref 3.3–5.1)
Sodium: 144 mmol/L (ref 133–145)

## 2017-06-14 LAB — POCT URINALYSIS DIPSTICK
Bilirubin,Ur: NEGATIVE
Blood,UA POCT: NEGATIVE
Glucose,UA POCT: NORMAL mg/dL
Ketones,UA POCT: NEGATIVE mg/dL
Lot #: 29981701
Nitrite,UA POCT: NEGATIVE
PH,UA POCT: 6 (ref 5–8)
Specific gravity,UA POCT: 1.01 (ref 1.002–1.030)
Urobilinogen,UA: NORMAL mg/dL

## 2017-06-14 NOTE — Progress Notes (Signed)
[facility] - Progress Note      HPI:  Tristan Mcbride is 72 y.o. year old male who has a past medical history significant for sensorineural hearing loss, GERD, HLD, HTN, BPH, elevated PSA.  Patient presents today for foul smell with urinating and showering.     He is spanish speaking. Video interpreter is used for todays visit.     He reports foul smelling urine for the past 2-3 weeks. He denies dysuria but he reports suprapubic pain. Pain is not constant. Pain is described as tight which is relieved after urinating. He reports taking flomax for his prostate. He reports no urinary incontinence or rectal pain. He denies hematuria, fever, or chills. No urethral discharge. Reports right sided testicular pain for several weeks. He is drinking 2 bottles of water daily.     Medications, allergies, and social history reviewed and updated with patient as appropriate.    Physical Exam:   Constitutional: Well developed male who is not in any acute distress.   Head: normocephalic and atraumatic.  Eyes: sclera and conjunctiva are normal.  EMouth: Oropharynx clear without erythema or exudate  Neck: supple, no lymphadenopathy.  Cardiovascular: RRR normal S1 and S2, no murmurs, no rubs, no gallops. No peripheral edema.   Pulmonary: CTA  Abdomen: soft, non distended, nontender, normal active bowel sounds.   GU: phallus normal. Urethral meatus normal. Testes in scrotum, TTP in right testicle. Epidiymis are normal.  Skin: warm and dry  Neuro: Alert and Orientation x3.   Psych: Mood and affect appropriate.     Vitals:    06/14/17 0820   BP: 146/82   Pulse: 66   Temp: 36.7 C (98.1 F)   TempSrc: Temporal   Weight: 76.9 kg (169 lb 9.6 oz)   Height: 1.753 m (5' 9.02")     Wt Readings from Last 3 Encounters:   06/14/17 76.9 kg (169 lb 9.6 oz)   04/15/17 77 kg (169 lb 11.2 oz)   02/17/17 77.3 kg (170 lb 6.4 oz)     BP Readings from Last 3 Encounters:   06/14/17 146/82   04/15/17 140/84   02/17/17 (!) 184/90       Vitals reviewed  this visit.     RESULTS:     ASSESSMENT/PLAN:     1. Uses Spanish as primary spoken language    2. Foul smelling urine  - POCT Urinalysis Dipstick: Normal  - Basic metabolic panel; Future  - CBC and differential; Future    3. Right testicular pain  - US scrotum & contents; Future  - US doppler testicular; Future  - follow up with urology     Follow up: 6 weeks       Darcus Pester, [place]   [facility]  06/14/2017 at 8:25 AM

## 2017-06-17 ENCOUNTER — Telehealth: Payer: Self-pay

## 2017-06-17 NOTE — Telephone Encounter (Signed)
-----   Message from Darcus Pester, [place] sent at 06/17/2017  8:34 AM EST -----  Please call. Please avoid all NSAIDs as possible. May need further workup on elevation in kidney function. We should repeat BMP at next visit and if continues to be elevated, should look into causes. Thank you!

## 2017-06-17 NOTE — Telephone Encounter (Signed)
Spoke to pt's wife Tristan Mcbride via [name] interpreter [phone] per Egbert Garibaldi PA.  Informed pt's wife that [name] wants pt to avoid all NSAIDS as possible- may need further workup on elevation of kidney function.  Also informed pt's wife that a repeat BMP (lab work) should be done at next visit if kidney function continues to be elevated in order to look into causes.  Pt's wife Tristan Mcbride stated verbal understanding.

## 2017-07-22 ENCOUNTER — Other Ambulatory Visit: Payer: Self-pay | Admitting: Internal Medicine

## 2017-07-22 NOTE — Telephone Encounter (Signed)
Rx request sent to grouper on 07/22/2017 2:40 PM. Provider unavailable.

## 2017-07-28 ENCOUNTER — Encounter: Payer: Self-pay | Admitting: Internal Medicine

## 2017-07-28 ENCOUNTER — Ambulatory Visit: Payer: Medicare (Managed Care) | Attending: Internal Medicine | Admitting: Internal Medicine

## 2017-07-28 VITALS — BP 120/60 | HR 70 | Temp 97.7°F | Ht 69.0 in | Wt 171.4 lb

## 2017-07-28 DIAGNOSIS — R8290 Unspecified abnormal findings in urine: Secondary | ICD-10-CM

## 2017-07-28 DIAGNOSIS — I10 Essential (primary) hypertension: Secondary | ICD-10-CM | POA: Insufficient documentation

## 2017-07-28 LAB — BASIC METABOLIC PANEL
Anion Gap: 12 (ref 7–16)
CO2: 26 mmol/L (ref 20–28)
Calcium: 10.5 mg/dL — ABNORMAL HIGH (ref 8.6–10.2)
Chloride: 100 mmol/L (ref 96–108)
Creatinine: 1.43 mg/dL — ABNORMAL HIGH (ref 0.67–1.17)
GFR,Black: 57 * — AB
GFR,Caucasian: 49 * — AB
Glucose: 266 mg/dL — ABNORMAL HIGH (ref 60–99)
Lab: 22 mg/dL — ABNORMAL HIGH (ref 6–20)
Potassium: 4.3 mmol/L (ref 3.3–5.1)
Sodium: 138 mmol/L (ref 133–145)

## 2017-07-28 LAB — URINALYSIS WITH REFLEX TO MICROSCOPIC
Blood,UA: NEGATIVE
Glucose,UA: 150 mg/dL — AB
Ketones, UA: NEGATIVE
Leuk Esterase,UA: NEGATIVE
Nitrite,UA: NEGATIVE
Protein,UA: NEGATIVE mg/dL
Specific Gravity,UA: 1.016 (ref 1.002–1.030)
pH,UA: 6 (ref 5.0–8.0)

## 2017-07-28 LAB — UNABLE TO PERFORM ADD-ON TESTING 1

## 2017-07-28 MED ORDER — TAMSULOSIN HCL 0.4 MG PO CAPS *I*
0.4000 mg | ORAL_CAPSULE | Freq: Every evening | ORAL | 0 refills | Status: DC
Start: 2017-07-28 — End: 2017-11-23

## 2017-07-28 MED ORDER — ATORVASTATIN CALCIUM 20 MG PO TABS *I*
20.0000 mg | ORAL_TABLET | Freq: Every day | ORAL | 1 refills | Status: DC
Start: 2017-07-28 — End: 2018-04-06

## 2017-07-28 MED ORDER — LISINOPRIL 20 MG PO TABS *I*
40.0000 mg | ORAL_TABLET | Freq: Every day | ORAL | 1 refills | Status: DC
Start: 2017-07-28 — End: 2018-06-01

## 2017-07-28 NOTE — Progress Notes (Signed)
[facility] Progress Note    Reason For Visit: No chief complaint on file.    Subjective   Tristan Mcbride is 72 y.o. man with medical history of HTN, GERD, BPH presenting for HTN follow up.    Interpreters used:  [id number]  [id number]    HTN  - not measuring outside of clinic  - no HA, CP, SOB  - lisinopril 20mg  daily, he reports taking every day /o missed doses    GERD  - taking omeprazole in the morning, feeling well  - endorsed heartburn, gastritis when he started the medication    BPH/urinary complaint  - urinating well, when taking medication  - persistent foul smelling urine from prior, w/o discharge, intermittent (not severe R testicular pain)    ROS    Interval Social Updates:  Denies smoking and alcohol    Medications reviewed and reconciled.   Allergies reviewed and reconciled     Objective     Vitals:    07/28/17 1518   BP: 120/60   Pulse: 70   Temp: 36.5 C (97.7 F)   TempSrc: Temporal   Weight: 77.7 kg (171 lb 6.4 oz)   Height: 1.753 m (5\' 9" )     Physical Examination:  Gen: Well-appearing latinoamerican man w wife at side sitting in chair in NAD  HEENT: NC/AT; nonicteric sclera; poor dentition  CV: RRR; no m/r/g  Resp: CTAB posteriorly w/o adventitious sounds  Abd: nontender  GU: no apparent lesion on testicles, uncircumcised penis or shaft; palpable testicles w/o mass; nontender epididymis, testicles; no inguinal LAD apprecaited bilat; (-)hernia exam to cough  Neuro: alert, appropriately conversant through [place] interpreter    Laboratory: Reviewed. Pertinent results as follows:  -UA bland from prior; Cr - 1.4 (baseline 1)    Imaging:   -None, US testicle ordered in past    Tristan Mcbride is 71 y.o. man with medical history of HTN, GERD, BPH presenting for HTN follow up.    Overall doing very well, w BPH symptoms well controlled on flomax and HTN controlled on ACEi (new). Difficult to explain foul-smelling urine, and hydration encouraged, will nonetheless repeat UA as well as GC/CT swabs  (first-time) as this is distressing to patient. No masses in testicle or LAD appreciated makes malignancy less likely.     Encouraged cessation of omeprazole, will trial and instructed to restart if pt notes heartburn symptoms return. Pt was OK with this.     Plan   Active and discussed issues:  1. Foul smelling urine  Urinalysis with reflex to microscopic    GC culture (urethra)    Chlamydia trachomatis culture (urethra)   2. Hypertension, unspecified type  Basic metabolic panel     GERD controlled  - will trial omeprazole  cessation  - follow up on symptoms at next visit    HLD   - repeat cholesterol at next visit    HTN at goal  - continue lisinopril 20mg   - monitor Cr w BMP today    BPH controlled w flomax  -continue flomax, repeat BMP for Cr bump    Orders Placed This Encounter    GC culture (urethra)    Chlamydia trachomatis culture (urethra)    Basic metabolic panel    Urinalysis with reflex to microscopic    tamsulosin (FLOMAX) 0.4 MG capsule    lisinopril (PRINIVIL,ZESTRIL) 20 MG tablet    atorvastatin (LIPITOR) 20 MG tablet     Health Maintenance:  - Colonoscopy-  due 2021  -  Dexa Scan - due at age 43  - Lung Cancer - n/a nonsmooker  Immunization status: up to date and documented, missing doses of Shingrix, educated pt on vaccines at pharmacy and insurance.  - AAA screening - n/a  - Hgb A1c - 5 mo ago, will repeat at next visit on an annual basis d/t stability (low 6)      Follow up:  -6 mo for HTN f/u, ask about urinary symptoms as above, BPH.     Tristan Buff, MD   Internal Medicine PGY-1  [facility] [phone]  07/28/2017 4:18 PM                     PCMH:  PCMH Hypertension Plan    Based on this patients clinical history and according to [place] guidelines target BP goal is: less than 130/80  Based on the patient's last BP of BP: 120/60 the patient is: at goal  The plan to reach goal   1.  Reviewed pt's understanding of medications including any barriers to adherence.   2.  Recommended lifestyle  modifications: medication compliance   3.  The patient understands their clinical goals and will undertake self-management recommendations including:all     4.  Medication Management: no changes made    5.  Referral to Care Management:: No   6.  Patient Ed/Self-management tools provided: Current self-management tools adequate

## 2017-07-28 NOTE — Patient Instructions (Addendum)
PARA su patilla omeprazole

## 2017-07-29 LAB — CHLAMYDIA PLASMID DNA AMPLIFICATION: Chlamydia Plasmid DNA Amplification: 0

## 2017-07-29 LAB — N. GONORRHOEAE DNA AMPLIFICATION: N. gonorrhoeae DNA Amplification: 0

## 2017-07-29 LAB — TRICHOMONAS DNA AMPLIFICATION: Trichomonas DNA amplification: 0

## 2017-07-29 NOTE — Addendum Note (Signed)
Addended by: Florian Buff on: 07/29/2017 01:45 PM     Modules accepted: Orders

## 2017-07-29 NOTE — Addendum Note (Signed)
Addended by: Florian Buff on: 07/29/2017 01:29 PM     Modules accepted: Orders

## 2017-07-30 ENCOUNTER — Telehealth: Payer: Self-pay | Admitting: Internal Medicine

## 2017-07-30 NOTE — Addendum Note (Signed)
Addended by: Florian Buff on: 07/30/2017 01:39 PM     Modules accepted: Orders

## 2017-07-30 NOTE — Telephone Encounter (Signed)
Florian Buff, MD     Please call patient (spanish speaking) that his urine labwork and kidenys look good. There is no infection. His sugar was a little high on his bloodwork, and I would like for him to go to the lab in the next week or so for another sugar lab.     Thanks. Catalina Antigua

## 2017-07-30 NOTE — Telephone Encounter (Signed)
Attempted to reach patient via [place] interpreter.  Left VM with callback number.

## 2017-08-02 NOTE — Telephone Encounter (Signed)
Patient's wife (listed as emergency contact) with interpreter warm transferred to writer but call was dropped.   Spoke with [employer]. She verbalized understanding. Patient will have bloodwork done in the next week.

## 2017-08-16 ENCOUNTER — Other Ambulatory Visit
Admission: RE | Admit: 2017-08-16 | Discharge: 2017-08-16 | Disposition: A | Discharge status: Home / Self Care | Payer: Medicare (Managed Care) | Source: Ambulatory Visit | Attending: Internal Medicine | Admitting: Internal Medicine

## 2017-08-16 DIAGNOSIS — R8290 Unspecified abnormal findings in urine: Secondary | ICD-10-CM | POA: Insufficient documentation

## 2017-08-17 LAB — HEMOGLOBIN A1C: Hemoglobin A1C: 9.7 % — ABNORMAL HIGH

## 2017-10-04 ENCOUNTER — Observation Stay
Admission: EM | Admit: 2017-10-04 | Discharge: 2017-10-06 | Disposition: A | Discharge status: Home / Self Care | Payer: Medicare (Managed Care) | Source: Ambulatory Visit | Attending: Internal Medicine | Admitting: Internal Medicine

## 2017-10-04 ENCOUNTER — Telehealth: Payer: Self-pay | Admitting: Internal Medicine

## 2017-10-04 ENCOUNTER — Encounter: Payer: Self-pay | Admitting: Emergency Medicine

## 2017-10-04 DIAGNOSIS — G8929 Other chronic pain: Secondary | ICD-10-CM | POA: Insufficient documentation

## 2017-10-04 DIAGNOSIS — N400 Enlarged prostate without lower urinary tract symptoms: Secondary | ICD-10-CM | POA: Insufficient documentation

## 2017-10-04 DIAGNOSIS — K219 Gastro-esophageal reflux disease without esophagitis: Secondary | ICD-10-CM | POA: Insufficient documentation

## 2017-10-04 DIAGNOSIS — E1165 Type 2 diabetes mellitus with hyperglycemia: Principal | ICD-10-CM | POA: Insufficient documentation

## 2017-10-04 DIAGNOSIS — E785 Hyperlipidemia, unspecified: Secondary | ICD-10-CM | POA: Insufficient documentation

## 2017-10-04 DIAGNOSIS — R358 Other polyuria: Secondary | ICD-10-CM

## 2017-10-04 DIAGNOSIS — R5383 Other fatigue: Secondary | ICD-10-CM

## 2017-10-04 DIAGNOSIS — R739 Hyperglycemia, unspecified: Secondary | ICD-10-CM | POA: Diagnosis present

## 2017-10-04 DIAGNOSIS — I129 Hypertensive chronic kidney disease with stage 1 through stage 4 chronic kidney disease, or unspecified chronic kidney disease: Secondary | ICD-10-CM | POA: Insufficient documentation

## 2017-10-04 DIAGNOSIS — N189 Chronic kidney disease, unspecified: Secondary | ICD-10-CM | POA: Insufficient documentation

## 2017-10-04 LAB — CBC AND DIFFERENTIAL
Baso # K/uL: 0.1 10*3/uL (ref 0.0–0.1)
Basophil %: 0.5 %
Eos # K/uL: 0.1 10*3/uL (ref 0.0–0.5)
Eosinophil %: 1.2 %
Hematocrit: 44 % (ref 40–51)
Hemoglobin: 14.4 g/dL (ref 13.7–17.5)
IMM Granulocytes #: 0 10*3/uL
IMM Granulocytes: 0.3 %
Lymph # K/uL: 3.6 10*3/uL (ref 1.3–3.6)
Lymphocyte %: 30.2 %
MCH: 27 pg/cell (ref 26–32)
MCHC: 33 g/dL (ref 32–37)
MCV: 84 fL (ref 79–92)
Mono # K/uL: 0.7 10*3/uL (ref 0.3–0.8)
Monocyte %: 6.1 %
Neut # K/uL: 7.4 10*3/uL — ABNORMAL HIGH (ref 1.8–5.4)
Nucl RBC # K/uL: 0 10*3/uL (ref 0.0–0.0)
Nucl RBC %: 0 /100 WBC (ref 0.0–0.2)
Platelets: 217 10*3/uL (ref 150–330)
RBC: 5.3 MIL/uL (ref 4.6–6.1)
RDW: 13 % (ref 11.6–14.4)
Seg Neut %: 61.7 %
WBC: 12 10*3/uL — ABNORMAL HIGH (ref 4.2–9.1)

## 2017-10-04 LAB — VENOUS BLOOD GAS
Base Excess,VENOUS: 1 mmol/L (ref <=2)
Bicarbonate,VENOUS: 26 mmol/L (ref 21–28)
CO2 (Calc),VENOUS: 28 mmol/L (ref 22–31)
CO: 0.3 %
FO2 HB,VENOUS: 24 % — ABNORMAL LOW (ref 63–83)
Hemoglobin: 14.6 g/dL (ref 13.7–17.5)
Methemoglobin: 1.2 % — ABNORMAL HIGH (ref 0.0–1.0)
PCO2,VENOUS: 45 mm Hg (ref 40–50)
PH,VENOUS: 7.38 (ref 7.32–7.42)
PO2,VENOUS: 20 mm Hg — ABNORMAL LOW (ref 25–43)

## 2017-10-04 LAB — POCT GLUCOSE
Glucose POCT: 503 mg/dL (ref 60–99)
Glucose POCT: 423 mg/dL — ABNORMAL HIGH (ref 60–99)
Glucose POCT: 321 mg/dL — ABNORMAL HIGH (ref 60–99)
Glucose POCT: 289 mg/dL — ABNORMAL HIGH (ref 60–99)

## 2017-10-04 LAB — BETA HYDROXYBUTYRATE: Beta Hydroxybutyrate: 0.35 mmol/L — ABNORMAL HIGH (ref 0.02–0.27)

## 2017-10-04 LAB — URINALYSIS REFLEX TO CULTURE
Blood,UA: NEGATIVE
Glucose,UA: >=1000 mg/dL — AB
Ketones, UA: NEGATIVE
Leuk Esterase,UA: NEGATIVE
Nitrite,UA: NEGATIVE
Protein,UA: 100 mg/dL — AB
Specific Gravity,UA: 1.005 (ref 1.002–1.030)
pH,UA: 6 (ref 5.0–8.0)

## 2017-10-04 LAB — PLASMA PROF 7 (ED ONLY)
Anion Gap,PL: 19 — ABNORMAL HIGH (ref 7–16)
CO2,Plasma: 20 mmol/L (ref 20–28)
Chloride,Plasma: 94 mmol/L — ABNORMAL LOW (ref 96–108)
Creatinine: 1.43 mg/dL — ABNORMAL HIGH (ref 0.67–1.17)
GFR,Black: 57 * — AB
GFR,Caucasian: 49 * — AB
Glucose,Plasma: 480 mg/dL — ABNORMAL HIGH (ref 60–99)
Potassium,Plasma: 4.2 mmol/L (ref 3.4–4.7)
Sodium,Plasma: 133 mmol/L (ref 133–145)
UN,Plasma: 29 mg/dL — ABNORMAL HIGH (ref 6–20)

## 2017-10-04 LAB — T4, FREE: Free T4: 1.2 ng/dL (ref 0.9–1.7)

## 2017-10-04 LAB — RUQ PANEL (ED ONLY)
ALT: 54 U/L — ABNORMAL HIGH (ref 0–50)
AST: 24 U/L (ref 0–50)
Albumin: 4.5 g/dL (ref 3.5–5.2)
Alk Phos: 141 U/L — ABNORMAL HIGH (ref 40–130)
Amylase: 94 U/L (ref 28–100)
Bilirubin,Direct: <0.2 mg/dL (ref 0.0–0.3)
Bilirubin,Total: 0.4 mg/dL (ref 0.0–1.2)
Lipase: 37 U/L (ref 13–60)
Total Protein: 7.8 g/dL — ABNORMAL HIGH (ref 6.3–7.7)

## 2017-10-04 LAB — URINE MICROSCOPIC (IQ200)
RBC,UA: <1 /hpf (ref 0–2)
WBC,UA: 1 /hpf (ref 0–5)

## 2017-10-04 LAB — TSH: TSH: 0.8 u[IU]/mL (ref 0.27–4.20)

## 2017-10-04 MED ORDER — ENOXAPARIN SODIUM 40 MG/0.4ML IJ SOSY *I*
40.0000 mg | PREFILLED_SYRINGE | Freq: Every day | INTRAMUSCULAR | Status: DC
Start: 2017-10-04 — End: 2017-10-07
  Administered 2017-10-04 – 2017-10-05 (×2): 40 mg via SUBCUTANEOUS
  Filled 2017-10-04 (×2): qty 0.4

## 2017-10-04 MED ORDER — DEXTROSE 5 % FLUSH FOR PUMPS *I*
0.0000 mL/h | INTRAVENOUS | Status: DC | PRN
Start: 2017-10-04 — End: 2017-10-07

## 2017-10-04 MED ORDER — GLUCAGON HCL (RDNA) 1 MG IJ SOLR *WRAPPED*
1.0000 mg | INTRAMUSCULAR | Status: DC | PRN
Start: 2017-10-04 — End: 2017-10-07

## 2017-10-04 MED ORDER — LISINOPRIL 40 MG PO TABS *I*
40.0000 mg | ORAL_TABLET | Freq: Every day | ORAL | Status: DC
Start: 2017-10-05 — End: 2017-10-07
  Administered 2017-10-05 – 2017-10-06 (×2): 40 mg via ORAL
  Filled 2017-10-04 (×2): qty 1
  Filled 2017-10-04: qty 4

## 2017-10-04 MED ORDER — SODIUM CHLORIDE 0.9 % IV BOLUS *I*
1000.0000 mL | Freq: Once | Status: AC
Start: 2017-10-04 — End: 2017-10-04
  Administered 2017-10-04: 1000 mL via INTRAVENOUS

## 2017-10-04 MED ORDER — SODIUM CHLORIDE 0.9 % FLUSH FOR PUMPS *I*
0.0000 mL/h | INTRAVENOUS | Status: DC | PRN
Start: 2017-10-04 — End: 2017-10-07

## 2017-10-04 MED ORDER — OMEPRAZOLE 20 MG PO CPDR *I*
20.0000 mg | DELAYED_RELEASE_CAPSULE | Freq: Every morning | ORAL | Status: DC
Start: 2017-10-05 — End: 2017-10-07
  Administered 2017-10-05 – 2017-10-06 (×2): 20 mg via ORAL
  Filled 2017-10-04 (×4): qty 1

## 2017-10-04 MED ORDER — INSULIN LISPRO (HUMAN) 100 UNIT/ML IJ/SC SOLN *WRAPPED*
0.0000 [IU] | Freq: Three times a day (TID) | SUBCUTANEOUS | Status: DC
Start: 2017-10-05 — End: 2017-10-05

## 2017-10-04 MED ORDER — DEXTROSE 50 % IV SOLN *I*
25.0000 g | INTRAVENOUS | Status: DC | PRN
Start: 2017-10-04 — End: 2017-10-07

## 2017-10-04 MED ORDER — ATORVASTATIN CALCIUM 10 MG PO TABS *I*
20.0000 mg | ORAL_TABLET | Freq: Every day | ORAL | Status: DC
Start: 2017-10-05 — End: 2017-10-07
  Administered 2017-10-05: 20 mg via ORAL
  Filled 2017-10-04: qty 2
  Filled 2017-10-04: qty 1

## 2017-10-04 MED ORDER — SODIUM CHLORIDE 0.9 % IV SOLN WRAPPED *I*
125.0000 mL/h | Status: DC
Start: 2017-10-04 — End: 2017-10-05
  Administered 2017-10-04 (×3): 125 mL/h
  Administered 2017-10-04: 125 mL/h via INTRAVENOUS
  Administered 2017-10-04 – 2017-10-05 (×4): 125 mL/h
  Administered 2017-10-05 (×2): 125 mL/h via INTRAVENOUS

## 2017-10-04 MED ORDER — GLUCOSE 40 % PO GEL *I*
15.0000 g | ORAL | Status: DC | PRN
Start: 2017-10-04 — End: 2017-10-07

## 2017-10-04 MED ORDER — INSULIN REGULAR HUMAN 100 UNIT/ML IJ SOLN *I*
5.0000 [IU] | Freq: Once | INTRAMUSCULAR | Status: AC
Start: 2017-10-04 — End: 2017-10-04
  Administered 2017-10-04: 5 [IU] via INTRAVENOUS

## 2017-10-04 NOTE — Telephone Encounter (Signed)
Spoke with Marcene Brawn via [place] interpreter # [phone]    Patient confirmed:      - BGs:  6/22 - 465, 6/23- 438, 6/24- meter says "High"  - was feeling fatigued over the weekend, "couldn't get up"  - anxious  - dizzy  -decrease concentration   -thirsty    Writer had patient re-check BG and reading is "High"    Patient Denied:    - headaches  -chang in vision    Writer informed patient that he should go to the emergency room immediately to be evaluated.    Patient does not want to take an ambulance and stated his wife will take him.    Patient is about 8-10 mins from hospital.    Patient verbalized understanding and confirmed they were leaving.    Writer called report to ED communication nurse Sarah.      Marland Kitchen

## 2017-10-04 NOTE — ED Triage Notes (Signed)
High BG'S 400-600 / ? New onset diabetes       Triage Note   Danielle Rankin, RN

## 2017-10-04 NOTE — ED Notes (Signed)
10/04/17 1020   Expected Call-In Information   ED Service [facility] Adult Call-in   PCP/Service Referral [facility]   Call received from [place]? No   Pt Info note/Reason for sending Pt has felt generally unwell over the weekend, had his SO take his BG which was >400 Saturday and Sunday, today has read "HI" x2. Pt without dx of DM.    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

## 2017-10-04 NOTE — H&P (Addendum)
Hospital Medicine Admission Note      CC: Abnormal Lab    HPI: Tristan Mcbride is a 72 y.o. male with a PMH HTN, HDL, BPH who presents to the ED with elevated BGs on his wife's home BG monitor.     The patient tells me that over the past month or so he has been experiencing increased fatigue. This is unusual for him as he is an extremely active individual at baseline. Also admits to some peripheral numbness and tingling in his hands. Also has had some weight loss over the past few months but he is unable to quantify. Increased thirst and urination as well lately.    A1c checked in may found to be 9.7 from low 6s. Unfortunately never got follow up.    ROS otherwise negative.    PMH:   Past Medical History:   Diagnosis Date    BPH (benign prostatic hypertrophy)     Esophageal reflux     Fibrolipoma     intramuscular adipose tissue    Hyperlipidemia     Hypertension     Hypoglycemia     Kidney cyst     Rotator cuff tendinitis     Right    Sexual dysfunction     absent ejaculation        PSH:   Past Surgical History:   Procedure Laterality Date    KNEE SURGERY      left    LEG TENDON SURGERY      Right       SH: reports that he has never smoked. He has never used smokeless tobacco. He reports that he does not drink alcohol or use drugs.     FH:   Family History   Problem Relation Age of Onset    Heart Disease Mother     Diabetes Mother     Diabetes Father     Stroke Father       Allergies:   Allergies as of 10/04/2017 - Up to Date 10/04/2017   Allergen Reaction Noted    Tetanus toxoids  08/20/2009    No known latex allergy  09/11/2010      Review of Systems   Constitutional: Negative for chills, fever, malaise/fatigue and weight loss.   Eyes: Negative for blurred vision and double vision.   Respiratory: Negative for cough and shortness of breath.    Cardiovascular: Negative for chest pain and palpitations.   Gastrointestinal: Negative for abdominal pain, heartburn, nausea and vomiting.   Genitourinary:  Negative for dysuria and urgency.   Musculoskeletal: Negative for myalgias.   Skin: Negative for rash.   Neurological: Negative for dizziness and headaches.   Endo/Heme/Allergies: Positive for polydipsia.   Psychiatric/Behavioral: Negative for depression.     Home Meds:   Prior to Admission medications    Medication Sig Start Date End Date Taking? Authorizing Provider   lisinopril (PRINIVIL,ZESTRIL) 20 MG tablet Take 2 tablets (40 mg total) by mouth daily 07/28/17  Yes Tristan Buff, MD   atorvastatin (LIPITOR) 20 MG tablet Take 1 tablet (20 mg total) by mouth daily (with dinner) 07/28/17  Yes Tristan Buff, MD   tamsulosin [facility]) 0.4 MG capsule Take 1 capsule (0.4 mg total) by mouth nightly 07/28/17 10/26/17  Tristan Buff, MD   omeprazole (PRILOSEC) 20 MG capsule Take 1 capsule (20 mg total) by mouth daily (before breakfast) 04/15/17 07/14/17  Tristan Buff, MD        Physical Exam:  Temp:  [36.1 C (97 F)-36.4 C (97.5 F)] 36.1 C (97 F)  Heart Rate:  [47-95] 47  Resp:  [13-20] 13  BP: (136-140)/(79-86) 136/79    General: Pleasant. Alert/interactive. NAD.  HEENT: Moist mucous membranes, anicteric, oropharynx benign  Neck: No LAD. No JVD.  Cor: RRR without M/R/G - Normal precordium and PMI.  Pulm: Normal WOB, CTA-B  Abd: Normal appearing, ND, soft, NTTP, no organomegaly or masses  Ext: 2+ peripheral pulses, no edema  Neuro: AOx3, speech/language WNL, moving all extremities, strength/sensation grossly intact. Coordination/gait not formally tested.  Skin: Benign, no rashes, ecchymoses, ulcers    Assessment/Plan:    A case of new onset diabetes in a 64M. Likely T2DM, but could also be mature onset diabetes of the young given the acute rise in A1c over the past 5 but at his age T2DM still most likely. Not in DKA.    Hyperglycemia  -s/p IV insulin 5U now  -SSI Humalog  -Pending insulin requirement inpatient may need insulin at home  -Diabetic diet  -Metformin on discharge    Chronic  Stable Medical Conditions:  HTN: Home lisinopril    F: PO  E: BMP  N: Diabetic    Discharge Plan: Pending correction of blood sugar and establishment of outpatient diabetic regimen.    Full Code    Tristan Morning, MD 8:54 PM 10/04/2017   Internal Medicine PGY2    HMD Attending Addendum:  I have seen and evaluated the patient and reviewed pertinent clinical documentation including EM provider/subspecialist and nursing notes, imaging, laboratory and microbiology data, and medication admistration and dosing schedules.  I agree with resident's history (including HPI, PMHx/PSHx, medications, allergies, family history, social history and ROS), physical examination and plan of care as outlined above.  Included below are my additional comments and reiterations:    HPI: Patient interviewed and examined with his wife at bedside.  Tristan Mcbride was conducted through a Engineer, manufacturing systems.      Tristan Mcbride reports that his only recently symptomology has been of increasing thirst.  His wife, suspecting possible hyperglycemia, checked his BG on 6/23 on her own glucometer and obtained a reading of >400.      I discussed with [name] and his wife that, given the severity of the patient's presenting hyperglycemia, insulin should be part of his diabetes treatment regimen (at this point, I had not yet seen his most recent Hgb A1c value).  After some discussion, the care team and the patient decided that a simple regimen, involving once daily basal insulin dosing, was most appropriate to start as it is something the patient feels he can understand and manage.      LABS: Notable for:     BG: 423 --> 321 --> 289 --> 295      Lab results: 10/05/17  0143 10/04/17  1724 07/28/17  1631 06/14/17  0903   Sodium 140  --  138 144   Potassium 4.4  --  4.3 4.7   Chloride 106  --  100 106   CO2 22  --  26 25   UN 24*  --  22* 20   Creatinine 1.20* 1.43* 1.43* 1.41*   GFR,Caucasian 61  --  49* 50*   GFR,Black 70  --  57* 58*   Glucose 295*  --  266*  188*   Calcium 9.6  --  10.5* 9.9     Lab Results   Component Value Date    HA1C 13.4 (  H) 10/04/2017     IMAGING:  No imaging results found.    ASSESSMENT/PLAN: Mr. Cotroneo is a 72 year old man with HTN, HLD, GERD, BPH who presents with hyperglycemia and new-onset DM    Diabetes mellitus: BG >500 on presentation.  Patient's Hgb A1c in early May was 9.7% after being in the low-to-mid 6's on several checks dating back to 2014.  His Hgb is elevated to 13.4%.  The rapidity of the patient's A1c rise raises a question of possible latent autoimmune diabetes in adult (LADA)  - Keep patient on Humalog SS with 3 unit NB today, then start patient on 12 units Lantus (~0.2 units/kg) tonight.    - Despite the patient's preference for once-daily basal insulin treatment, given his markedly elevated A1c I suspect that Lantus as insulin monotherapy may not allow him to achieve adequate glycemic control  - Start metformin 500 mg BID  - Check anti-GAD titer  - FSBG qACHS    DISPOSITION: Expected LOS 2-3 days    Remainder of care plan as noted in Dr. Luanne Bras note above.  Plan of care discussed with HMD NP Harland Dingwall.    Nance Pear, MD, MPH  Attending Physician, [facility]  [phone]

## 2017-10-04 NOTE — ED Provider Notes (Addendum)
History     Chief Complaint   Patient presents with    Blood Sugar Problem     Tristan Mcbride is a 72 y.o. male with history of HTN, HLD presenting with fatigue and elevated BG. Patient reports feeling run down over the past few weeks. Associated polyuria, polydipsia. Denies fever, chills, n/v, abdominal pain. Has had no prior history of DM or BG problems, states that he see doctors regularly.         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    Rotator Cuff Tendonitis M71.9, M67.919    Sexual Dysfunction, NOS F52.9    Benign Prostatic Hypertrophy With Urinary Obstruction N40.1    Serology Prostate-specific Antigen (PSA) Elevated R97.20    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            Past Surgical History:   Procedure Laterality Date    KNEE SURGERY      left    LEG TENDON SURGERY      Right     Family History   Problem Relation Age of Onset    Heart Disease Mother     Diabetes Mother     Diabetes Father     Stroke Father           Social History   Substance Use Topics    Smoking status: Never Smoker    Smokeless tobacco: Never Used    Alcohol use No     Living Situation     Questions Responses    Patient lives with Spouse    Homeless No    Caregiver for other family member     External Services     Employment Occupation(comment)    Comment: welding     Domestic Violence Risk No                Review of Systems   Review of Systems    Constitutional: Positive for fatigue. Negative for chills and fever.   HENT: Negative for rhinorrhea and sore throat.    Eyes: Negative for visual disturbance.   Respiratory: Negative for cough and shortness of breath.    Cardiovascular: Negative for chest pain, palpitations and leg swelling.   Gastrointestinal: Negative for abdominal pain, constipation, diarrhea, nausea and vomiting.   Endocrine: Positive for polydipsia and polyuria.   Genitourinary: Negative for dysuria, frequency and hematuria.   Musculoskeletal: Negative for arthralgias and myalgias.   Skin: Negative for rash and wound.   Neurological: Negative for dizziness, light-headedness and headaches.   Psychiatric/Behavioral: Negative for agitation and confusion.       Physical Exam     Triage Vitals  Triage Start: Start, (10/04/17 1133)   First Recorded BP: 139/86, Resp: 20, Temp:  36.4 C (97.5 F), Temp src: TEMPORAL Oxygen Therapy SpO2: 99 %, Oximetry Source: Rt Hand, Heart Rate: 95, (10/04/17 1137)  .      Physical Exam   Constitutional: He is oriented to person, place, and time. He appears well-developed and well-nourished. No distress.   HENT:   Head: Normocephalic and atraumatic.   Mouth/Throat: Oropharynx is clear and moist. No oropharyngeal exudate.   Eyes: Pupils are equal, round, and reactive to light. No scleral icterus.   Neck: Neck supple. No JVD present. No tracheal deviation present.   Cardiovascular: Normal rate, regular rhythm and intact distal pulses.  Exam reveals no gallop and no friction rub.    No murmur heard.  Pulmonary/Chest: Effort normal. No respiratory distress. He has no wheezes. He has no rales.   Abdominal: Soft. He exhibits no distension. There is no tenderness. There is no rebound and no guarding.   Musculoskeletal: He exhibits no edema.   Neurological: He is alert and oriented to person, place, and time.   Skin: Skin is warm and dry. He is not diaphoretic.   Psychiatric: He has a normal mood and affect. His behavior is  normal. Thought content normal.   Nursing note and vitals reviewed.      Medical Decision Making        Initial Evaluation:  ED First Provider Contact     Date/Time Event User Comments    10/04/17 1118 ED First Provider Contact Jennette Dubin Initial Face to Face Provider Contact          Patient seen by me on arrival date of 10/04/2017.    Assessment:  72 y.o.male comes to the ED with relatively asymptomatic hyperglycemia. Well appearing on exam.       Differential Diagnosis includes:  New DM, DKA, metabolic derangement, dehydration    Plan:   Orders Placed This Encounter   Procedures    Urinalysis reflex to culture    CBC and differential    Plasma profile 7 (Adult ED only)    RUQ panel (ED only)    Urinalysis with reflex to Microscopic UA and reflex to Bacterial Culture    Beta hydroxybuterate    Venous blood gas    Telemetry x 48 hours    EKG 12 lead (initial)    Insert peripheral IV   IVF bolus    ED Course: Blood work without signs of DKA. Admitted to medicine for BG control       Orvan Seen, MD    Resident Attestation:    Patient seen by me on 10/04/2017.    History:  I reviewed this patient, reviewed the resident's note and agree.    Exam:  I examined this patient, reviewed the resident's note and agree.    Decision Making:  I discussed with the resident his/her documented decision making and agree.      Author:  Eustaquio Maize, MD       Orvan Seen, MD  Resident  10/04/17 2037       Eustaquio Maize, MD  10/05/17 1011

## 2017-10-04 NOTE — ED Notes (Signed)
Pt presents to ED with c/o fatigue, weakness, polyuria, polydipsia x2 days. No current dx of DM. Pt endorses family hx of DM and recognized symptoms which prompted him to check BG which resulted as >400. BG on presentation 423. Denies SOB, CP, N/V, fever. AOx4. IV in place, labs sent, on continuous tele, bolus hung. Will continue to monitor and tx per provider orders.

## 2017-10-04 NOTE — ED Notes (Signed)
Report Given To  Matt, RN Hilliard Clark, RN        Descriptive Sentence / Reason for Admission   72 yo M presents to ED with c/o fatigue, weakness, polyuria, polydipsia x2 days. No current dx of DM. Pt endorses family hx of DM and recognized symptoms which prompted him to check BG which resulted as >400. BG on presentation 423. Denies SOB, CP, N/V, fever. AOx4.       Active Issues / Relevant Events   - BG 480   - Polyuria polydipsia   - Fatigue       To Do List  - VS / Assess q4h  - Medicate per [facility]       Anticipatory Guidance / Discharge Planning  Pending ED workup

## 2017-10-04 NOTE — Telephone Encounter (Signed)
Tristan Mcbride called states patient blood sugar level is 438 this morning.    Caller is requesting a callback [phone]

## 2017-10-04 NOTE — ED Notes (Signed)
ED RN INTERN ATTESTATION       I Tristan Gobble Berdell Hostetler, RN (RN) reviewed the following charting information by the RN intern:  Darylene Price     Nursing Assessments  Medications  Plan of Care  Teaching   Notes    In the chart of Tristan Mcbride 72 y.o. male) and attest to the charting being accurate.

## 2017-10-04 NOTE — First Provider Contact (Signed)
ED First Provider Contact Note    Initial provider evaluation performed by   ED First Provider Contact     Date/Time Event User Comments    10/04/17 1118 ED First Provider Contact Jennette Dubin Initial Face to Face Provider Contact      Patient is spanish speaking and we are waiting for the interpreter. Patient states he has had increased fatigue with thirst and increased urination. He checked his sugar with his wife's glucometer and it read "high" BG 503-upon arrival    Vital signs reviewed.    Orders placed:  LABS     Patient requires further evaluation.     Jennette Dubin, NP, 10/04/2017, 11:18 AM     Jennette Dubin, NP  10/04/17 1139

## 2017-10-05 DIAGNOSIS — R739 Hyperglycemia, unspecified: Secondary | ICD-10-CM

## 2017-10-05 DIAGNOSIS — E1365 Other specified diabetes mellitus with hyperglycemia: Secondary | ICD-10-CM

## 2017-10-05 LAB — INSULIN, TOTAL: Insulin: 14 u[IU]/mL (ref 3–25)

## 2017-10-05 LAB — BASIC METABOLIC PANEL
Anion Gap: 12 (ref 7–16)
CO2: 22 mmol/L (ref 20–28)
Calcium: 9.6 mg/dL (ref 8.6–10.2)
Chloride: 106 mmol/L (ref 96–108)
Creatinine: 1.2 mg/dL — ABNORMAL HIGH (ref 0.67–1.17)
GFR,Black: 70 *
GFR,Caucasian: 61 *
Glucose: 295 mg/dL — ABNORMAL HIGH (ref 60–99)
Lab: 24 mg/dL — ABNORMAL HIGH (ref 6–20)
Potassium: 4.4 mmol/L (ref 3.3–5.1)
Sodium: 140 mmol/L (ref 133–145)

## 2017-10-05 LAB — POCT GLUCOSE
Glucose POCT: 279 mg/dL — ABNORMAL HIGH (ref 60–99)
Glucose POCT: 290 mg/dL — ABNORMAL HIGH (ref 60–99)
Glucose POCT: 171 mg/dL — ABNORMAL HIGH (ref 60–99)
Glucose POCT: 147 mg/dL — ABNORMAL HIGH (ref 60–99)

## 2017-10-05 LAB — HOLD LAVENDER

## 2017-10-05 LAB — HOLD GREEN WITH GEL

## 2017-10-05 LAB — HEMOGLOBIN A1C: Hemoglobin A1C: 13.4 % — ABNORMAL HIGH

## 2017-10-05 MED ORDER — INSULIN LISPRO (HUMAN) 100 UNIT/ML IJ/SC SOLN *WRAPPED*
0.0000 [IU] | Freq: Three times a day (TID) | SUBCUTANEOUS | Status: DC
Start: 2017-10-05 — End: 2017-10-05
  Administered 2017-10-05 (×2): 7 [IU] via SUBCUTANEOUS
  Filled 2017-10-05: qty 3

## 2017-10-05 MED ORDER — INSULIN LISPRO (HUMAN) 100 UNIT/ML IJ/SC SOLN *WRAPPED*
0.0000 [IU] | Freq: Three times a day (TID) | SUBCUTANEOUS | Status: AC
Start: 2017-10-05 — End: 2017-10-05
  Administered 2017-10-05: 4 [IU] via SUBCUTANEOUS
  Filled 2017-10-05: qty 3

## 2017-10-05 MED ORDER — ACETAMINOPHEN 325 MG PO TABS *I*
650.0000 mg | ORAL_TABLET | Freq: Four times a day (QID) | ORAL | Status: DC | PRN
Start: 2017-10-05 — End: 2017-10-07

## 2017-10-05 MED ORDER — METFORMIN HCL 500 MG PO TABS *I*
500.0000 mg | ORAL_TABLET | Freq: Two times a day (BID) | ORAL | Status: DC
Start: 2017-10-05 — End: 2017-10-07
  Administered 2017-10-05 – 2017-10-06 (×3): 500 mg via ORAL
  Filled 2017-10-05 (×6): qty 1

## 2017-10-05 MED ORDER — INSULIN GLARGINE 100 UNIT/ML SC SOLN *WRAPPED*
12.0000 [IU] | Freq: Every evening | SUBCUTANEOUS | Status: DC
Start: 2017-10-05 — End: 2017-10-07
  Administered 2017-10-05: 12 [IU] via SUBCUTANEOUS
  Filled 2017-10-05: qty 10

## 2017-10-05 NOTE — ED Notes (Signed)
Report Given To  Olivia RN       Descriptive Sentence / Reason for Admission   72 yo M presents to ED with c/o fatigue, weakness, polyuria, polydipsia x2 days. No current dx of DM. Pt endorses family hx of DM and recognized symptoms which prompted him to check BG which resulted as >400. BG on presentation 423. Denies SOB, CP, N/V, fever. AOx4.       Active Issues / Relevant Events   - BG 295  - Polyuria polydipsia   - Fatigue       To Do List  - VS / Assess q4h  - Medicate per MAR   - POCT Glucose  - Diabetic DIet  -Metformin       Anticipatory Guidance / Discharge Planning  Admit for Hyperglycemia

## 2017-10-05 NOTE — ED Notes (Signed)
Spanish interpreted assisted with assessment. Pt in NAD and without complaint, hospital bed ordered at this time. Pt denies any questions/concerns. Will continue to monitor.

## 2017-10-05 NOTE — ED Notes (Signed)
Report Given To  Lizzy RN      Descriptive Sentence / Reason for Admission   72 yo M presents to ED with c/o fatigue, weakness, polyuria, polydipsia x2 days. No current dx of DM. Pt endorses family hx of DM and recognized symptoms which prompted him to check BG which resulted as >400. BG on presentation 423. Denies SOB, CP, N/V, fever. AOx4.       Active Issues / Relevant Events   - BG 295  - Polyuria polydipsia   - Fatigue       To Do List  - VS / Assess q4h  - Medicate per MAR   - POCT Glucose  - Diabetic DIet      Anticipatory Guidance / Discharge Planning  Admit for Hyperglycemia

## 2017-10-05 NOTE — Progress Notes (Signed)
10/05/17 1000   [facility] Patient Class Review   Patient Class Review Inpatient   Patient Class Effective 10/04/2017  Earnest Conroy RN   Utilization  Management  X [id number]  Page [phone]

## 2017-10-05 NOTE — ED Notes (Signed)
Assumed care of patient at approximately 1630, received report from previous RN. Pt resting quietly & comfortably in NAD and without complaint. Will continue to monitor, assess, and treat per provider orders.

## 2017-10-05 NOTE — ED Notes (Signed)
Received report, assumed patient care at this time. Will continue to monitor and treat per MD orders

## 2017-10-05 NOTE — ED Notes (Signed)
Hospital bed ordered.

## 2017-10-05 NOTE — Progress Notes (Addendum)
Hospital Medicine APP Progress Note:   Patient seen & chart reviewed    Subjective:   Patient denies acute concerns  Denies CP, SOB, N/V and Abdominal Pain  Reports polydipsia and polyuria at home  Reports some intermittent numbness and tingling in R thumb and bilateral hips and legs but chronic (several years)     Wife at bedside  Seen with Dr. Violeta Gelinas  Spanish interpreter bedside, appreciate assistance     Vitals:    10/04/17 2200 10/05/17 0135 10/05/17 0528 10/05/17 1005   BP: 131/76 121/74 122/79 157/73   BP Location: Left arm Left arm     Pulse: 56  56 56   Resp:  16 18 18    Temp: 36.6 C (97.9 F) 36.4 C (97.5 F) 36.3 C (97.3 F) 36.3 C (97.3 F)   TempSrc: Temporal Temporal Temporal    SpO2: 97% 97% 98% 99%   Weight:       Height:           Objective:   Neuro: awake, alert, oriented x3  General: no signs of acute distress   Lungs: unlabored  Heart: no pedal edema   Abd: normoactive bowel sounds. Denies tenderness of palpation. Soft, non-distended.  Ext: bilateral radial pulses 2+, bilateral pedal pulses 2+       Labs: reviewed      Recent Labs  Lab 10/04/17  1724   WBC 12.0*   Hemoglobin 14.4   14.6   Hematocrit 44   Platelets 217         Lab results: 10/05/17  0143 10/04/17  1724 07/28/17  1631 06/14/17  0903   Sodium 140  --  138 144   Potassium 4.4  --  4.3 4.7   Chloride 106  --  100 106   CO2 22  --  26 25   UN 24*  --  22* 20   Creatinine 1.20* 1.43* 1.43* 1.41*   GFR,Caucasian 61  --  49* 50*   GFR,Black 70  --  57* 58*   Glucose 295*  --  266* 188*   Calcium 9.6  --  10.5* 9.9       No results for input(s): INR in the last 168 hours.  Recent Labs      10/04/17   1724   AST  24   ALT  54*   Bilirubin,Total  0.4   Bilirubin,Direct  <0.2     No components found with this basename: ALKPHOS      Recent Labs  Lab 10/05/17  0949 10/04/17  2302 10/04/17  2208 10/04/17  1752 10/04/17  1136   Glucose POCT 279* 289* 321* 423* 503*       A/P: 38M. Likely T2DM, but could also be mature onset diabetes of the  young given the acute rise in A1c over the past 5 but at his age T2DM still most likely    Hyperglycemia A1c 13.4  - Continue ISS with 3u NB   - Start Lantus 12u nightly tonight, discontinue ISS in AM  - Continue Metformin  - Once insulin regimen is established, will have diabetic educator see     Chronic Stable Medical Conditions:  Chronic Pain start PRN Acetaminophen   HTN Home Lisinopril  HLD Home Lipitor    GERD Home Omeprazole     DVT prophylaxis: Lovenox  Dispo: TBD, likely 1-2 days.    Plan of care discussed with Dr. Violeta Gelinas. Please see Dr. Consuello Bossier note for full  plan of care.     Harland Dingwall, NP  12:14 PM  10/05/2017    Scheduled Meds:   metFORMIN  500 mg Oral BID WC    insulin lispro  0-20 Units Subcutaneous TID WC    atorvastatin  20 mg Oral Daily with dinner    lisinopril  40 mg Oral Daily    omeprazole  20 mg Oral QAM    enoxaparin  40 mg Subcutaneous Daily @ 2100

## 2017-10-06 LAB — BASIC METABOLIC PANEL
Anion Gap: 11 (ref 7–16)
CO2: 23 mmol/L (ref 20–28)
Calcium: 9.8 mg/dL (ref 8.6–10.2)
Chloride: 105 mmol/L (ref 96–108)
Creatinine: 1.36 mg/dL — ABNORMAL HIGH (ref 0.67–1.17)
GFR,Black: 61 *
GFR,Caucasian: 52 * — AB
Glucose: 165 mg/dL — ABNORMAL HIGH (ref 60–99)
Lab: 22 mg/dL — ABNORMAL HIGH (ref 6–20)
Potassium: 4.4 mmol/L (ref 3.3–5.1)
Sodium: 139 mmol/L (ref 133–145)

## 2017-10-06 LAB — POCT GLUCOSE
Glucose POCT: 205 mg/dL — ABNORMAL HIGH (ref 60–99)
Glucose POCT: 250 mg/dL — ABNORMAL HIGH (ref 60–99)

## 2017-10-06 LAB — C-PEPTIDE: C-Peptide: 4.3 ng/mL (ref 1.1–4.4)

## 2017-10-06 MED ORDER — INSULIN GLARGINE 100 UNIT/ML SC SOPN *I*
12.0000 [IU] | PEN_INJECTOR | Freq: Every evening | SUBCUTANEOUS | 0 refills | Status: DC
Start: 2017-10-06 — End: 2017-10-13

## 2017-10-06 MED ORDER — INSULIN PEN NEEDLE 32G X 4 MM MISC *A*
0 refills | Status: DC
Start: 2017-10-06 — End: 2017-10-27

## 2017-10-06 MED ORDER — GLUCOSE BLOOD VI STRP *A*
ORAL_STRIP | 0 refills | Status: DC
Start: 2017-10-06 — End: 2017-11-24

## 2017-10-06 MED ORDER — ONETOUCH DELICA LANCETS 33G MISC *A*
0 refills | Status: AC
Start: 2017-10-06 — End: 2017-11-05

## 2017-10-06 MED ORDER — METFORMIN HCL 500 MG PO TABS *I*
500.0000 mg | ORAL_TABLET | Freq: Two times a day (BID) | ORAL | 0 refills | Status: DC
Start: 2017-10-06 — End: 2017-10-27

## 2017-10-06 MED ORDER — CYANOCOBALAMIN 1000 MCG/ML IJ SOLN *I*
1000.0000 ug | INTRAMUSCULAR | Status: DC
Start: 2017-10-13 — End: 2017-10-06

## 2017-10-06 MED ORDER — ALCOHOL SWABS PADS *I*
MEDICATED_PAD | 0 refills | Status: AC
Start: 2017-10-06 — End: 2017-11-05

## 2017-10-06 NOTE — Discharge Instructions (Signed)
Your diagnosis was: Diabetes Mellitus     What was done:   - Blood work showed that your blood sugar level was high. You have a new diagnosis of diabetes, which is a life long disease. You were taught how to give your self insulin and received diabetes education. You reported understanding and are ready for discharge today.     What do I need to do:   - Please take medications as prescribed  - Please attend follow up appointments as scheduled    Recommended diet: Diabetic     Recommended activity: activity as tolerated    Diabetes management: check fingerstick blood glucoses two times everyday, keep a record for your Dr. And take to your appointment    HOME INSULIN REGIMEN     LANTUS (insulin glargine) (or Basaglar or Levemir (insulin detemir)) (long acting insulin) dose: 12 units  Take 1 time per day, at the same time every day, even if blood sugar is normal,  Please take at 9:00pm every night.     If you experience any of these symptoms 24 hours or more after discharge:Uncontrolled pain, Chest pain, Shortness of breath, Fever of 101 F. or greater, Chills, Poor appetite, Poor urinary output, Vomiting, Nausea, Diarrhea, Blood in stool or Weakness  Call Dr. Alonza Smoker, Altamese Dilling, MD at [phone]      If you experience any of these symptoms within the first 24 hours after discharge call Dr. Violeta Gelinas at [phone]

## 2017-10-06 NOTE — Progress Notes (Signed)
[facility] SOCIAL WORK  PHARMACY FORM     Todays date:  October 06, 2017    Patient Name: Tristan Mcbride      Medical Record #: [id number]   DOB: [birth year]  Patients Address: [place], [address]                Social Worker: Emi Holes, MSW       Date of Service: October 06, 2017       Funding Source: [place]  ___________________________________________________________________    Pharmacy Information:  Date/time sent: October 06, 2017     Time needed: asap    Patient Location: Inpatient (specify unit)  [phone]    Medication Pick-up Preference: Patient will pick up at the pharmacy    Pharmacy Contact: [place] work signature: Emi Holes, MSW  Supervisor/Manager Approval (if indicated):  Date:  Soil scientist signature not required for Medicaid pending)

## 2017-10-06 NOTE — Consults (Signed)
Diabetes Education    Received consult for diabetes education.  72 y.o. male with newly diagnosed diabetes.  Saw patient with Spanish interpreter.   Lab Results   Component Value Date    HA1C 13.4 (H) 10/04/2017       Reviewed with patient: long-term complications of uncontrolled diabetes, target blood sugars, basal insulin, signs and symptoms of hypo/hyperglycemia and appropriate treatment, when to call MD for high BG. Written information was provided for all of the above in Spanish.     Provided patient with a One Touch Verio Flex glucose meter. Demonstrated use of glucose meter. Patient was able to test own blood sugar with minimal prompting. Target blood sugars were reviewed.  Discussed testing blood sugar 2 times daily.      Reviewed Lantus  dose and administration with insulin pens. Patient was able to provide a return demonstration with minimal prompting after 2 practice tries. Verbalizes understanding of Lantus  dose.    Reviewed sources of carbohydrate and the MyPlate method for portion control. Written information was provided for all of the above.    Patient did well with mock injection.  He was able to verbalize that he will take insulin once daily at 9 pm and he will check his BG morning and before insulin at night.  He was able to state his dose of 12 units.  Education was lengthy.  He needed lots of reinforcement in the big picture, but expect he will be able to perform the necessary tasks.     For discharge, patient will need:     OneTouch Verio test strips    -order number: [id number]   -Please order 50 test strips   OneTouch Delica lancets    -order number: [id number]   -Please order 100 lancets     Solostar Lantus Insulin pens    -item [id number] in database lookup     Insulin pen needles    -item [phone] in database lookup   Alcohol swab pads   -item [phone] in [place], [place], [place]

## 2017-10-06 NOTE — Progress Notes (Addendum)
Pt admitted via bed from ED to [facility] [place].  VS and assessment as documented. Awaiting Spanish interpreter to complete history. Breakfast ordered.      Admission history completed and patient aware of transfer to TCU until available floor bed, admitting for diabetic education and management.  Completed via Solicitor.

## 2017-10-06 NOTE — ED Notes (Signed)
Report Given To  Helene Kelp, RN      Descriptive Sentence / Reason for Admission   Pt with c/o increased fatigue x1 month associated with some peripheral numbness/tingling in bilateral hands with polyuria & polydipsia. Checked BG at home which showed >400.      Active Issues / Relevant Events   - A&Ox4  - Ambulatory at baseline  - Primarily Spanish speaking  - Diabetic diet - new onset DM  - Full code        To Do List  - Vitals/Assess per policy  - Medication administration  - BG's ACHS        Anticipatory Guidance / Discharge Planning  Admitted to medicine inpatient for hyperglycemia

## 2017-10-06 NOTE — Progress Notes (Addendum)
Hospital Medicine Attending Physician - Daily Progress Note for Tristan Mcbride  Admit Date: 10/04/2017    Reason for hospitalization:   Hyperglycemia; new-onset diabetes    Interval History (last 24 hours):   - BG better controlled    Subjective:  - Patient voices no new somatic complaints.  He reports improvement of his thirst.  - Mr. 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    Objective:    Medications:  Scheduled Meds:   metFORMIN  500 mg Oral BID WC    insulin glargine  12 Units Subcutaneous Nightly    atorvastatin  20 mg Oral Daily with dinner    lisinopril  40 mg Oral Daily    omeprazole  20 mg Oral QAM    enoxaparin  40 mg Subcutaneous Daily @ 2100     Continuous Infusions:  PRN Meds:.   acetaminophen  650 mg Oral Q6H PRN    sodium chloride  0-500 mL/hr Intravenous PRN    dextrose  0-500 mL/hr Intravenous PRN    dextrose  15 g Oral PRN    And    dextrose  25 g Intravenous PRN    And    glucagon  1 mg Intramuscular PRN       Vital Signs:  BP: (115-157)/(62-80)   Temp:  [36.3 C (97.3 F)-37 C (98.6 F)]   Temp src: Oral (06/26 0811)  Heart Rate:  [51-82]   Resp:  [14-18]   SpO2:  [96 %-99 %]   Height:  [175.3 cm (5\' 9" )]   Weight:  [77.1 kg (170 lb)]       Intake/Output Summary (Last 24 hours) at 10/06/17 0836  Last data filed at 10/05/17 1049   Gross per 24 hour   Intake                0 ml   Output              100 ml   Net             -100 ml     General: NAD, alert and appropriately interactive  HENT: NCAT, MMM  Eyes: EOMI, sclera anicteric  Pulmonary: NWOB.  Clear to auscultation bilaterally, no W/R/R  CV: Regular rate and rhythm  GI: Soft, NT/ND, NABS throughout   MSK/Extremities: Warm, well perfused. 2+ peripheral pulses x 4  Neurology: Alert and oriented x3, no gross neuro deficit    Laboratory Data:      Lab results: 10/04/17  1724 06/14/17  0903   WBC 12.0* 9.0   Hemoglobin 14.4   14.6 14.5    Hematocrit 44 46   RBC 5.3 5.3   Platelets 217 232   Neut # K/uL 7.4* 5.3   Lymph # K/uL 3.6 2.7   Mono # K/uL 0.7 0.6   Eos # K/uL 0.1 0.2   Baso # K/uL 0.1 0.1   Seg Neut % 61.7 59.4   Lymphocyte % 30.2 30.5   Monocyte % 6.1 7.1   Eosinophil % 1.2 1.9   Basophil % 0.5 0.8       Recent Labs  Lab 10/06/17  0205 10/05/17  0143 10/04/17  1724   Sodium 139 140  --    Potassium 4.4 4.4  --    Chloride 105 106  --    CO2 23  22  --    UN 22* 24*  --    Creatinine 1.36* 1.20* 1.43*       No components found with this basename:  GLU    Recent Labs  Lab 10/06/17  0205 10/05/17  0143   Calcium 9.8 9.6       Recent Labs  Lab 10/04/17  1724   ALT 54*   AST 24       No components found with this basename: ALKPHOS, BILITOT, BILIDIR, PROT, LABALBU, APT  No components found with this basename: Leilani Merl, CKMBINDEX    Recent Labs  Lab 10/05/17  2158 10/05/17  1946 10/05/17  1420 10/05/17  0949 10/04/17  2302 10/04/17  2208 10/04/17  1752 10/04/17  1136   Glucose POCT 147* 171* 290* 279* 289* 321* 423* 503*       Assessment: Lynne Righi is a 72 year old man with HTN, HLD, GERD, BPH who presented with hyperglycemia and new-onset DM    Plan:  Diabetes mellitus: BG >500 on presentation.  Patient's Hgb A1c in early May was 9.7% after being in the low-to-mid 6's on several checks dating back to 2014.  His Hgb is elevated to 13.4%.    - For the sake of simplicity and patient comfort, we have elected to start on basal insulin dosing onlye at 12 units Lantus qPM  - Metformin 500 mg BID  - Checking anti-GAD titer (pending)  - FSBG qACHS  - Patient to receive DM education and supplies today  - I stressed with the patient the importance of close outpatient follow-up given his new diagnosis, including regular foot checks for diabetic foot ulcers and annual eye checks for diabetic retinopathy    HTN:  - Continue home antihypertensive      Discharge Planning:   Est. Discharge Date (EDD): Home today  Appointments Needed with:  PCP    Code status: FULL    Plan of care discussed with HMD NP Harland Dingwall, DM educator Royann Shivers and bedside RN.    Nance Pear, MD, MPH  Attending Physician, [facility]  [phone]

## 2017-10-06 NOTE — Progress Notes (Signed)
Patient was discharged at ~6pm to home via personal ride. All belongings packed and sent with patient, including meds picked up from pharmacy by unit staff and BG meter provided by Diabetes Educator. PIV removed without signs or symptoms of infection. VSS. AVS completed and reviewed with patient by Ples Specter, RN using Spanish interpreter. patient is agreeable to discharge at this time. Education provided regarding anti-diabetes medications, reinforced teaching provided earlier by Diabetes Educator. Chistina Roston [place], RN

## 2017-10-06 NOTE — ED Notes (Signed)
Report Given To  A. Colunio, RN      Descriptive Sentence / Reason for Admission   Pt with c/o increased fatigue x1 month associated with some peripheral numbness/tingling in bilateral hands with polyuria & polydipsia. Checked BG at home which showed >400.      Active Issues / Relevant Events   A&O x4, ambulatory at baseline  Primarily Spanish speaking  Diabetic diet - new onset DM  Full code        To Do List  Vitals/Assess per policy  Medication administration  BG's ACHS        Anticipatory Guidance / Discharge Planning  Admitted to medicine inpatient for hyperglycemia

## 2017-10-06 NOTE — ED Notes (Signed)
Assumed care of pt, received report from previous RN. Pt appears in NAD. Will monitor and treat pt per provider orders.

## 2017-10-06 NOTE — Progress Notes (Signed)
Patient arrived to the unit at ~1pm from TCU accompanied by [facility] staff. Patient oriented to unit, call bell, and patient rights. Patient provided with admission packet. Patient is A&Ox3, Spanish primary language. Vital signs completed, see Flowsheet.  Ambulatory status independent. 4 eyed skin assessment completed with Christian, S.RN; no significant findings. Belongings documented in admission navigator by . Cell phone/computer/tablet Yes (specify): Smart phone. Glasses No  . Dentures No  . Wallet/Purse No  . Hearing aids No  . Home medications No  . Clothes/shoes Yes. Belongings sent to cashiers No  . Tristan Mcbride [place], RN

## 2017-10-07 ENCOUNTER — Telehealth: Payer: Self-pay | Admitting: Internal Medicine

## 2017-10-07 LAB — GLUTAMIC ACID DECARBOXYLASE: Glutamic Acid Decarboxlase: <5 IU/mL (ref 0.0–5.0)

## 2017-10-07 NOTE — Telephone Encounter (Signed)
Care Management Transition Care Assessment    Date of Discharge Call:  10/07/2017- phone contact attempted with spanish interpreter and message left with request to call [facility] office if questions, concerns and reminder left regarding SIM appt on 10/13/17.  Patient has attended [facility] appt today. No further discharge follow up by care management at this time.       Reason for admission:  Hyperglycemia- newly diagnosed diabetes with insulin started      Are you on any anticoagulation medication?  no

## 2017-10-07 NOTE — Telephone Encounter (Signed)
Review of AVS:    Date of Discharge:    10/06/17    Reason for admission:    Hyperglycemia     Discharged to :     Home    On Coumadin or Warfarin:  no    Follow up appointment:  10/13/2017 10:10 AM Darcus Pester, PA [facility]

## 2017-10-07 NOTE — Discharge Summary (Signed)
Name: Tristan Mcbride MRN: [id number] DOB: 07/08/1945     Admit Date: 10/04/2017   Date of Discharge: 10/06/2017     Patient was accepted for discharge to   Home or Self Care [1]           Discharge Attending Physician: Rosendo Gros, MD      Hospitalization Summary    CONCISE NARRATIVE: Tristan Mcbride is a 72 year old man with HTN, CKD, GERD and BPH who presented to [facility] on 10/04/17 with several weeks of polyuria and polydipsia.  On presentation Tristan Mcbride was found to have a blood glucose of 503.  He was treated acutely with IV insulin.  Tristan Mcbride was found to have a Hgb A1c of 13.4 percent (up from 9.7 on 08/16/17).  He was started on Lantus 12 units nightly.  For ease of administration, the care team and Tristan Mcbride agreed to once-daily rather than basal/bolus insulin dosing.  Tristan Mcbride received diabetes education and was discharged home on 10/06/17 with close outpatient follow-up.                     SIGNIFICANT MED CHANGES: Yes  Start:  Metformin 500mg  PO BID  Lantus 12u SQ nightly      [place], Renee M, [place] [facility]             Signed: Nance Pear, MD  On: 10/07/2017  at: 3:23 PM

## 2017-10-13 ENCOUNTER — Encounter: Payer: Medicare (Managed Care) | Admitting: Retina Ophthalmology

## 2017-10-13 ENCOUNTER — Encounter: Payer: Self-pay | Admitting: Gastroenterology

## 2017-10-13 ENCOUNTER — Encounter: Payer: Self-pay | Admitting: Physician Assistant

## 2017-10-13 ENCOUNTER — Ambulatory Visit: Payer: Medicare (Managed Care) | Attending: Physician Assistant | Admitting: Physician Assistant

## 2017-10-13 VITALS — BP 102/60 | HR 78 | Temp 97.9°F | Ht 69.02 in | Wt 156.8 lb

## 2017-10-13 DIAGNOSIS — H35363 Drusen (degenerative) of macula, bilateral: Secondary | ICD-10-CM

## 2017-10-13 DIAGNOSIS — K5900 Constipation, unspecified: Secondary | ICD-10-CM

## 2017-10-13 DIAGNOSIS — Z09 Encounter for follow-up examination after completed treatment for conditions other than malignant neoplasm: Secondary | ICD-10-CM

## 2017-10-13 DIAGNOSIS — E119 Type 2 diabetes mellitus without complications: Secondary | ICD-10-CM

## 2017-10-13 DIAGNOSIS — H269 Unspecified cataract: Secondary | ICD-10-CM

## 2017-10-13 DIAGNOSIS — Z789 Other specified health status: Secondary | ICD-10-CM

## 2017-10-13 DIAGNOSIS — H40003 Preglaucoma, unspecified, bilateral: Secondary | ICD-10-CM

## 2017-10-13 HISTORY — DX: Type 2 diabetes mellitus without complications: E11.9

## 2017-10-13 MED ORDER — SENNOSIDES 8.6 MG PO TABS *I*
1.0000 | ORAL_TABLET | Freq: Every day | ORAL | 1 refills | Status: DC
Start: 2017-10-13 — End: 2017-10-27

## 2017-10-13 MED ORDER — INSULIN GLARGINE 100 UNIT/ML SC SOPN *I*
12.0000 [IU] | PEN_INJECTOR | Freq: Every evening | SUBCUTANEOUS | 2 refills | Status: DC
Start: 2017-10-13 — End: 2017-11-16

## 2017-10-13 NOTE — Progress Notes (Signed)
[facility] - Progress Note      HPI:  Tristan Mcbride is 72 y.o. year old male who has a past medical history significant for sensorineural hearing loss, GERD, HLD, HTN, DM, BPH, and elevated PSA.  Patient presents today for hospital discharge follow up.     Hospital summary- patient was admitted for hyperglycemia. A1c was 13.4. He was started on lantus 12 units nightly.     Phone interpreator used for today's visit.     BG log:  Fasting:140, 148, 142, 192, 153, 181  Bedtime: 206, 138, 258, 298, 190, 126, 91    He notes he had nausea at work one time. He is a Building control surveyor. He notes he was not sweating or shaking. No other nausea episodes. Nausea lasted for a day. No dizziness. He notes for breakfast- he only ate hot tea and thinks his BG was low. He didn't check it. He notes, since he started eating food for breakfast, he has not had nausea. He continues on metformin 500 mg daily. He drinks water during the day. No headaches, vision changes, numbness or tingling.     He reports constipation. He reports last BM was 2 days ago. No blood in stool. Has only moved bowels 2 times since being home from hospital. No abdominal or vomiting. He reports eating vegetables but avoiding fruits.     Medications, allergies, and social history reviewed and updated with patient as appropriate.  Medications:     Current Outpatient Prescriptions:     metFORMIN (GLUCOPHAGE) 500 MG tablet, Take 1 tablet (500 mg total) by mouth 2 times daily (with meals), Disp: 60 tablet, Rfl: 0    blood glucose (ONETOUCH VERIO) test strip, Test  2 times a day.   Brand name of strips onetouch, Disp: 60 strip, Rfl: 0    ONETOUCH DELICA LANCETS 38H, Please use twice daily, Disp: 100 each, Rfl: 0    insulin glargine 100 unit/mL injection pen, Inject 12 Units into the skin nightly, Disp: 3.6 mL, Rfl: 0    insulin pen needle (BD ULTRA-FINE PEN NEEDLE NANO) 32G X 4 MM, Use 1 times a day as instructed., Disp: 100 each, Rfl: 0    alcohol swabs pads, Use 2  times a day as instructed., Disp: 100 each, Rfl: 0    tamsulosin (FLOMAX) 0.4 MG capsule, Take 1 capsule (0.4 mg total) by mouth nightly, Disp: 90 capsule, Rfl: 0    lisinopril (PRINIVIL,ZESTRIL) 20 MG tablet, Take 2 tablets (40 mg total) by mouth daily, Disp: 180 tablet, Rfl: 1    atorvastatin (LIPITOR) 20 MG tablet, Take 1 tablet (20 mg total) by mouth daily (with dinner), Disp: 90 tablet, Rfl: 1    omeprazole (PRILOSEC) 20 MG capsule, Take 1 capsule (20 mg total) by mouth daily (before breakfast), Disp: 90 capsule, Rfl: 0  Physical Exam:   Constitutional: Well developed spanish speaking  male who is not in any acute distress.   Head: normocephalic and atraumatic.  Eyes: sclera and conjunctiva are normal  Cardiovascular: RRR normal S1 and S2, no murmurs, no rubs, no gallops. No peripheral edema.   Pulmonary: CTA  Abdomen: soft, nontender, normal active bowel sounds.   Skin: warm and dry  Neuro: Alert and Orientation x3.  Psych: Mood and affect appropriate.     Vitals:    10/13/17 1009   BP: 102/60   Pulse: 78   Temp: 36.6 C (97.9 F)   TempSrc: Temporal   Weight: 71.1 kg (156 lb  12.8 oz)   Height: 1.753 m (5' 9.02")     Wt Readings from Last 3 Encounters:   10/13/17 71.1 kg (156 lb 12.8 oz)   10/06/17 77.1 kg (170 lb)   07/28/17 77.7 kg (171 lb 6.4 oz)     BP Readings from Last 3 Encounters:   10/13/17 102/60   10/06/17 128/76   07/28/17 120/60     The 10-year ASCVD risk score Mikey Bussing DC Jr., et al., 2013) is: 21.1%    Values used to calculate the score:      Age: 45 years      Sex: Male      Is Non-Hispanic African American: No      Diabetic: Yes      Tobacco smoker: No      Systolic Blood Pressure: 914 mmHg      Is BP treated: Yes      HDL Cholesterol: 51 mg/dL      Total Cholesterol: 153 mg/dL      Vitals reviewed this visit.     RESULTS:     ASSESSMENT/PLAN:     1. Uses Spanish as primary spoken language  - phone interpreter used for today's visit    2. DM (diabetes mellitus)- recently hospitalized for  hyperglycemia   - Last a1c 13.4  - continue lantus 12 units nightly  - continue metformin 500 mg daily   - eye exam done in office today   - on ace inhibitor; defer microalbumin  - foot exam at next OV  - continue BG monitoring, bring log to next visit. Advised to check BG is nausea happens again   - continue atorvastatin 20 mg daily. ASCVD risk 21.1. Increase to high intensity statin at next visit    3. Constipation  - start senna 8.6 daily   - increase fiber and water in diet     Follow up: 2 weeks for DM    Darcus Pester, [place]   [facility]  10/13/2017 at 10:24 AM

## 2017-10-27 ENCOUNTER — Ambulatory Visit: Payer: Medicare (Managed Care) | Attending: Internal Medicine | Admitting: Internal Medicine

## 2017-10-27 ENCOUNTER — Encounter: Payer: Self-pay | Admitting: Internal Medicine

## 2017-10-27 VITALS — BP 108/64 | HR 78 | Temp 96.8°F | Ht 69.0 in | Wt 153.9 lb

## 2017-10-27 DIAGNOSIS — K5900 Constipation, unspecified: Secondary | ICD-10-CM | POA: Insufficient documentation

## 2017-10-27 DIAGNOSIS — E119 Type 2 diabetes mellitus without complications: Secondary | ICD-10-CM | POA: Insufficient documentation

## 2017-10-27 DIAGNOSIS — Z794 Long term (current) use of insulin: Secondary | ICD-10-CM | POA: Insufficient documentation

## 2017-10-27 DIAGNOSIS — E1165 Type 2 diabetes mellitus with hyperglycemia: Secondary | ICD-10-CM | POA: Insufficient documentation

## 2017-10-27 LAB — COMPREHENSIVE METABOLIC PANEL
ALT: 34 U/L (ref 0–50)
AST: 19 U/L (ref 0–50)
Albumin: 4.3 g/dL (ref 3.5–5.2)
Alk Phos: 75 U/L (ref 40–130)
Anion Gap: 13 (ref 7–16)
Bilirubin,Total: 0.3 mg/dL (ref 0.0–1.2)
CO2: 22 mmol/L (ref 20–28)
Calcium: 10.6 mg/dL — ABNORMAL HIGH (ref 8.6–10.2)
Chloride: 104 mmol/L (ref 96–108)
Creatinine: 1.49 mg/dL — ABNORMAL HIGH (ref 0.67–1.17)
GFR,Black: 54 * — AB
GFR,Caucasian: 47 * — AB
Glucose: 141 mg/dL — ABNORMAL HIGH (ref 60–99)
Lab: 34 mg/dL — ABNORMAL HIGH (ref 6–20)
Potassium: 4.3 mmol/L (ref 3.3–5.1)
Sodium: 139 mmol/L (ref 133–145)
Total Protein: 7.1 g/dL (ref 6.3–7.7)

## 2017-10-27 MED ORDER — METFORMIN HCL 500 MG PO TABS *I*
1000.0000 mg | ORAL_TABLET | Freq: Two times a day (BID) | ORAL | 0 refills | Status: DC
Start: 2017-10-27 — End: 2017-10-29

## 2017-10-27 MED ORDER — INSULIN PEN NEEDLE 32G X 4 MM MISC *A*
3 refills | Status: AC
Start: 2017-10-27 — End: 2017-11-26

## 2017-10-27 MED ORDER — SENNOSIDES 8.6 MG PO TABS *I*
2.0000 | ORAL_TABLET | Freq: Two times a day (BID) | ORAL | 1 refills | Status: DC
Start: 2017-10-27 — End: 2017-10-30

## 2017-10-27 NOTE — Progress Notes (Signed)
[facility] Progress Note    Reason For Visit: No chief complaint on file.    Subjective   Tristan Mcbride is 72 y.o. man with medical history of DM, HTN, HLD presenting for hosp f/u.    Hyperglycemic admission   - insulin 12U nightly, but unclear if he has only one pen   - reviewed steps of pen use, and were good  - metformin 500mg  BID adherent, thanks to wife    Stomach discomfort  - heaviness in stomach  - 2x/week  - denies black or red, but reports yellow?  - reports senna use 1 pill BID  - loss weight, poor appetite and nausea  - no change in taste of food; no consistent post-prandial bloating    ROS    Interval Social Updates:  - wife at home    Medications reviewed and reconciled.   Allergies reviewed and reconciled   Objective     Vitals:    10/27/17 0815   BP: 108/64   BP Location: Right arm   Patient Position: Sitting   Cuff Size: adult   Pulse: 78   Temp: 36 C (96.8 F)   TempSrc: Temporal   Weight: 69.8 kg (153 lb 14.4 oz)   Height: 1.753 m (5\' 9" )     Physical Examination:  Gen: Well-appearing hispanic man sitting in chair in NAD  HEENT: NC/AT  Resp: nonlabored  Abd: protuberant but nondistended; soft, NT, nl-sounding BS w/o high=pitched sounds  Extrem: thin   DM foot: 7/8 monofilant (missed on R foot); mildly dry skin w/o lesion; 2+ DP pulses bilat  Integ: intact  Neuro: alert, conversant  Psych: affect - pleasant, jovial    Laboratory Reviewed. Pertinent results as follows:  - 13.4 h A1C    Imaging Reviewed. Pertinent results as follows:  -none new    Assessment   Tristan Mcbride is 72 y.o. man with medical history of DM, HTN, HLD presenting for hosp f/u.    DM beginning to be well-controlled from the logbook - will optimize w increase in metformin, supply more insulin pens. Reviewed BG check and insulin admin protocol.     Stomach discomfort concerning w weight loss. Constipation will be treated symptomatically, but will draw CMP for eval of liver/biliary tract function proxy as well as GAD Abs for  testing for LADA - concern is pancreatic mass. If unresolved symptoms  By next visit in 6 weeks - consider CT a/p.    Plan   Active and discussed issues:  1. Type 2 diabetes mellitus  - insulin pen needle (BD ULTRA-FINE PEN NEEDLE NANO) 32G X 4 MM; Use 1 times a day as instructed.  Dispense: 100 each; Refill: 3  - Glutamic acid decarboxylase AB; Future  - COMPREHENSIVE METABOLIC PANEL; Future  - Glutamic acid decarboxylase AB  - COMPREHENSIVE METABOLIC PANEL    2. 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, but concern is for poor pancreatic function (?mass);   - senna (SENOKOT) 8.6 MG tablet; Take 2 tablets by mouth 2 times daily  Dispense: 90 tablet; Refill: 1  - consider CT a/p in future  - labwork above too    Orders Placed This Encounter    Glutamic acid decarboxylase AB    COMPREHENSIVE METABOLIC PANEL    insulin pen needle (BD ULTRA-FINE PEN NEEDLE NANO) 32G X 4 MM    senna (SENOKOT) 8.6 MG tablet     Chronic issues:  HTN cont  lisinopril 40mg  tab  HLD atorva 20mg  daily  BPH cont flomax    Health Maintenance:  - Colonoscopy- last 2016; due 2021  - DEXA scan - deferred  - Lung Cancer - deferred   - Immunization status: missing doses of shingrix.  - AAA screening - deferred  - Hgb A1c - 13.4    Follow up interval/topics:  -6 weeks for DM f/u, abdominal symptoms (if ongoing - of even if not, cont wt loss - would CT a/p).     Florian Buff, MD   Internal Medicine PGY-2  [facility] [phone]  10/27/2017 9:14 AM             PCMH:  PCMH Diabetes Plan    According to current ADA guidelines the patient A1C goal is: less than 8  The patient's last A1C was 10/04/2017: Hemoglobin A1C 13.4 %* (Ref range: %), the patient is: at goal and above goal.  Diabetic foot exam was performed:  Yes  The plan to reach goal   1.  Reviewed pt's understanding of medications including barriers to adherence    2.  Recommended lifestyle modifications: glucose monitoring   3.  The  patient understands their clinical goals and will undertake self-management recommendations including: all   4.  Medications Management: the following medication changes were made today: 1000mg  metformin.   5.  Referral to Care Management:: No   6.  Patient Ed/Self Management tools provided: Current self-management tools adequate  PCMH Hypertension Plan    Based on this patients clinical history and according to JNC guidelines target BP goal is: less than 130/80  Based on the patient's last BP of BP: 108/64 the patient is: at goal  The plan to reach goal   1.  Reviewed pt's understanding of medications including any barriers to adherence.   2.  Recommended lifestyle modifications: medication compliance   3.  The patient understands their clinical goals and will undertake self-management recommendations including:all     4.  Medication Management: no changes made    5.  Referral to Care Management:: No   6.  Patient Ed/Self-management tools provided: Current self-management tools adequate

## 2017-10-28 ENCOUNTER — Other Ambulatory Visit: Payer: Self-pay | Admitting: Internal Medicine

## 2017-10-28 ENCOUNTER — Telehealth: Payer: Self-pay | Admitting: Internal Medicine

## 2017-10-28 DIAGNOSIS — E119 Type 2 diabetes mellitus without complications: Secondary | ICD-10-CM

## 2017-10-28 DIAGNOSIS — K5900 Constipation, unspecified: Secondary | ICD-10-CM

## 2017-10-28 DIAGNOSIS — E1165 Type 2 diabetes mellitus with hyperglycemia: Secondary | ICD-10-CM

## 2017-10-28 DIAGNOSIS — Z794 Long term (current) use of insulin: Secondary | ICD-10-CM

## 2017-10-28 NOTE — Telephone Encounter (Signed)
[name] called in for patient and reports that there is a $100 copay for the patient's Metformin and Sennosides.    A voucher request was sent (see separate encounter) to cover the copay.    Please transfer the refills from [place] to [facility].      A prior auth may be needed.    Carmen's contact number is [phone]

## 2017-10-28 NOTE — Telephone Encounter (Signed)
Tristan Mcbride (family member) is requesting a medication voucher for Metformin and Sennosides     Voucher to be sent to [facility].    Medication refill request sent? yes see today's encounter - we are switching the pharmacy from [place] to [place]    Patient contact number is [phone]

## 2017-10-28 NOTE — Telephone Encounter (Signed)
[facility] SOCIAL WORK  PHARMACY FORM     Todays date:  October 28, 2017    Patient Name: Tristan Mcbride      Medical Record #: [id number]   DOB: 1945/12/22  Patients Address: [place], [place] [address]                Social Worker: Levell July, LMSW       Date of Service: October 28, 2017       Funding Source: [place]  ___________________________________________________________________    Pharmacy Information:  Date/time sent: October 28, 2017     Time needed: ASAP    Patient Location: Outpatient    Medication Pick-up Preference: Patient will pick up at the pharmacy    Pharmacy Contact: [place] work signature: Levell July, [place] (if indicated):  Date:  (supervisor signature not required for Medicaid pending)      Levell July, [place] Work  [facility] Internal Medicine  Pager [phone]

## 2017-10-28 NOTE — Telephone Encounter (Signed)
Please see patient message needs script sent to [facility] pharmacy to be covered by voucher. Separate encounter sent to social work for voucher.    Rx request sent to provider for processing on 10/28/2017 9:36 AM

## 2017-10-28 NOTE — Telephone Encounter (Signed)
Routing to social work for [employer]. Separate encounter sent to Dr. Hildred Alamin to switch medication refills to [place].

## 2017-10-29 NOTE — Telephone Encounter (Signed)
"  medication voucher for Metformin and Sennosides was placed"    Medications need to be sent to [place].    Writer did not load Sennosides, does not see on patient med list.    Routing to provider 10/29/2017 5:04 PM

## 2017-10-29 NOTE — Telephone Encounter (Signed)
Patient is calling to report the medication is not available at the pharmacy     Patient is requesting for the voucher to be sent     Patient can be reached at [phone]

## 2017-10-29 NOTE — Telephone Encounter (Signed)
Additional voucher is not needed, prescriptions are at a different pharmacy and need to be sent to [facility].    Levell July, LMSW  Social Work  [facility] Internal Medicine  Pager [phone]

## 2017-10-29 NOTE — Telephone Encounter (Signed)
Routing to social work 10/29/2017 4:21 PM

## 2017-10-29 NOTE — Addendum Note (Signed)
Addended by: Kelli Hope on: 10/29/2017 05:04 PM     Modules accepted: Orders

## 2017-10-30 MED ORDER — SENNOSIDES 8.6 MG PO TABS *I*
2.0000 | ORAL_TABLET | Freq: Two times a day (BID) | ORAL | 1 refills | Status: AC
Start: 2017-10-30 — End: 2017-11-29

## 2017-10-30 MED ORDER — METFORMIN HCL 500 MG PO TABS *I*
1000.0000 mg | ORAL_TABLET | Freq: Two times a day (BID) | ORAL | 0 refills | Status: AC
Start: 2017-10-30 — End: 2018-01-28

## 2017-10-30 NOTE — Addendum Note (Signed)
Addended by: Florian Buff on: 10/30/2017 04:07 PM     Modules accepted: Orders

## 2017-11-01 LAB — GLUTAMIC ACID DECARBOXYLASE: Glutamic Acid Decarboxlase: <5 IU/mL (ref 0.0–5.0)

## 2017-11-01 NOTE — Telephone Encounter (Signed)
Florian Buff, MD      Sent to [facility], please let pt know. Thanks and sorry for that.  (Routing comment)     Wrtier called and spoke to [facility] pharmacy. Pharmacy said that they could not fill the script for Metformin because it was sent to another pharmacy.    Writer called the [employer] it was sent to and they said patient had already picked up script for Metformin. And they picked up a script for the Senna on 73/19 so patient has both medications.    Writer tried to call patient but there was no answer. Writer left generic message to call back office.    Routing to social work as Pharmacist, hospital.

## 2017-11-10 ENCOUNTER — Encounter: Payer: Self-pay | Admitting: Internal Medicine

## 2017-11-10 NOTE — Telephone Encounter (Signed)
erroneous

## 2017-11-16 ENCOUNTER — Telehealth: Payer: Self-pay | Admitting: Internal Medicine

## 2017-11-16 ENCOUNTER — Other Ambulatory Visit: Payer: Self-pay | Admitting: Internal Medicine

## 2017-11-16 MED ORDER — INSULIN GLARGINE 100 UNIT/ML SC SOPN *I*
12.0000 [IU] | PEN_INJECTOR | Freq: Every evening | SUBCUTANEOUS | 2 refills | Status: DC
Start: 2017-11-16 — End: 2018-02-05

## 2017-11-16 NOTE — Telephone Encounter (Signed)
Voucher addressed in separate encounter.    Routing to provider to address refill.    Rx request sent to provider for processing on 11/16/2017 1:55 PM

## 2017-11-16 NOTE — Telephone Encounter (Signed)
[facility] SOCIAL WORK  PHARMACY FORM     Todays date:  November 16, 2017    Patient Name: Tristan Mcbride      Medical Record #: [id number]   DOB: 10-09-1945  Patients Address: [place], [address]                Social Worker: Regino Schultze, LMSW       Date of Service: November 16, 2017       Funding Source: [place] is to cover [employer] co-pay for his insulin  ___________________________________________________________________    Pharmacy Information:  Date/time sent: November 16, 2017     Time needed: asap    Patient Location: Outpatient    Medication Pick-up Preference: Please call ext. [phone] when ready    Pharmacy Contact: [place] work signature: Regino Schultze, LMSW  Supervisor/Manager Approval (if indicated):  Date:  (supervisor signature not required for Medicaid pending)

## 2017-11-16 NOTE — Telephone Encounter (Signed)
Thi Sisemore calling to request prescription(s) insulin glargine 100 unit/mL injection pen  to be sent to the following [facility].    Is patient out of the medication? yes      Patient can be reached if necessary at [phone]

## 2017-11-16 NOTE — Telephone Encounter (Signed)
Patient is requesting a medication voucher for insulin glargine 100 unit/mL injection pen     Voucher to be sent to [place].    Medication refill request sent? yes    Patient contact number is [phone].

## 2017-11-16 NOTE — Telephone Encounter (Signed)
Patient is requesting a medication voucher for insulin glargine 100 unit/mL injection pen     Voucher to be sent to [place].    Medication refill request sent? yes    Patient contact number is [phone].    Separate refill request sent to provider.    Routing to social work 11/16/2017 1:54 PM

## 2017-11-23 ENCOUNTER — Other Ambulatory Visit: Payer: Self-pay | Admitting: Internal Medicine

## 2017-11-23 DIAGNOSIS — Z794 Long term (current) use of insulin: Secondary | ICD-10-CM

## 2017-11-23 DIAGNOSIS — E1165 Type 2 diabetes mellitus with hyperglycemia: Secondary | ICD-10-CM

## 2017-11-23 NOTE — Telephone Encounter (Signed)
Rx request sent to provider for processing on 11/23/2017 2:22 PM

## 2017-11-23 NOTE — Telephone Encounter (Signed)
Tristan Mcbride calling to request prescription blood glucose (ONETOUCH VERIO) test strip to be sent to the following [facility].    Is patient out of the medication? no      Patient can be reached if necessary at [phone].

## 2017-11-24 ENCOUNTER — Other Ambulatory Visit: Payer: Self-pay | Admitting: Internal Medicine

## 2017-11-24 NOTE — Telephone Encounter (Signed)
Rx request sent to provider for processing on 11/24/2017 1:19 PM

## 2017-11-24 NOTE — Telephone Encounter (Signed)
Hello patient needs blood glucose (ONETOUCH VERIO) test strip refilled and [facility] to [facility].

## 2017-11-26 MED ORDER — GLUCOSE BLOOD VI STRP *A*
ORAL_STRIP | 0 refills | Status: DC
Start: 2017-11-26 — End: 2017-12-15

## 2017-12-15 ENCOUNTER — Other Ambulatory Visit: Payer: Self-pay | Admitting: Internal Medicine

## 2017-12-15 ENCOUNTER — Ambulatory Visit: Payer: Medicare (Managed Care) | Attending: Internal Medicine | Admitting: Internal Medicine

## 2017-12-15 ENCOUNTER — Encounter: Payer: Self-pay | Admitting: Internal Medicine

## 2017-12-15 VITALS — BP 100/70 | HR 64 | Temp 97.9°F | Ht 69.0 in | Wt 150.4 lb

## 2017-12-15 DIAGNOSIS — Z0000 Encounter for general adult medical examination without abnormal findings: Secondary | ICD-10-CM | POA: Insufficient documentation

## 2017-12-15 DIAGNOSIS — E119 Type 2 diabetes mellitus without complications: Secondary | ICD-10-CM | POA: Insufficient documentation

## 2017-12-15 LAB — POCT HEMOGLOBIN A1C: Hemoglobin A1C,POC: 7.2 % — ABNORMAL HIGH

## 2017-12-15 MED ORDER — GLUCOSE BLOOD VI STRP *A*
ORAL_STRIP | 0 refills | Status: DC
Start: 2017-12-15 — End: 2018-01-14

## 2017-12-15 MED ORDER — ALCOHOL SWABS PADS *I*
MEDICATED_PAD | 5 refills | Status: DC
Start: 2017-12-15 — End: 2018-07-28

## 2017-12-15 MED ORDER — PEN NEEDLES 31G X 6 MM MISC *A*
2 refills | Status: DC
Start: 2017-12-15 — End: 2018-03-08

## 2017-12-15 MED ORDER — LANCETS 30G MISC
5 refills | Status: DC
Start: 2017-12-15 — End: 2018-07-28

## 2017-12-15 NOTE — Patient Instructions (Signed)
Llama el clinico si tiene un a pregunta o problemd de [employer]	[place].     Incluyendo [employer].

## 2017-12-15 NOTE — Progress Notes (Signed)
[facility] Progress Note    Reason For Visit: No chief complaint on file.    Subjective   Tristan Mcbride is 72 y.o. man with medical history of HTN, T2DM (recent dx), BPH, HLD presenting for follow up.    T2DM  - compliant w qHS lantus 12U  - compliant q 1000mg  BID metformin  - brings sugar log today - all 90-130    Weight/diet/bowel habit  - reports weight stabilized now, and with good appetite, and good energy (change from prior)  - bowel habit has improved w PRN laxative use and perhaps increase w metformin he comments  - diet - has questions about 0 sugar drinks and nutrition recs    Misc  - L leg pain - from work incident in past where machine pinched his two legs - required R femur and L knee surgery   - longstanding, but first comment to me, wondering if I could take look at it   - interimttent pain, relieved w naproxen PRN - says uses at most once every couple weeks    ROS    Interval Social Updates:  - wife present at visit, no changes  - recently in [place] on a short trip over weekend     Medications reviewed and reconciled.   Allergies reviewed and reconciled   Objective     Vitals:    12/15/17 0931   BP: 100/70   Pulse: 64   Temp: 36.6 C (97.9 F)   TempSrc: Temporal   Weight: 68.2 kg (150 lb 6.4 oz)   Height: 1.753 m (5\' 9" )       Physical Examination:  Gen: Smiling, thin latinoamerican man w wife at side in NAD; translator present at visit  HEENT: NC/AT  Lymph: no cerv, supraclav, (-) virchow node  CV: RRR: no m/r/g  Resp: CTAB posteriorly  Abd: NT/ND,  No masses palpated, normal sounding BS  Extrem: +~8cm lateral incision on R thigh, minimally tender at scar; +2.5cm horizonal scar on L patella, nontender; no ecchymosis or wound evident  Integ: dry  Neuro: alert, oriented to conversation  Psych: remarkably pleasant and jocular as usual    Laboratory Reviewed. Pertinent results as follows:  -A1C POCT today - 7 < 13 at time of dx    Imaging Reviewed. Pertinent results as follows:  -none  new    Assessment   Tristan Mcbride is 72 y.o. man with medical history of HTN, T2DM (recent dx), BPH, HLD presenting for follow up.    Doing well. Remarkable improvement in A1C w current regimen, will keep for now. Asking for nutritionist referral and provided that today at visit. Pain in leg a new compliant but in fact chronic for him - will monitor. Further, his weight has stabilized as well as his bowel habit and energy (with glycemic control) - I was previously more concerned about potential malignancy, and should he have recurrence of theses symptoms (esp in absence of glycemic instability) would pursue abdominal imaging, repeat colonoscopy, etc.     Plan   Active and discussed issues:  1. T2DM (type 2 diabetes mellitus)  - POCT Hemoglobin A1C  - blood glucose (ONETOUCH VERIO) test strip; Test  2 times a day.   Brand name of strips onetouch  Dispense: 60 strip; Refill: 0  - Lancets 30G MISC; Use as directed. Dispense lowest cost lancet based on patient's insurance.  Dispense: 100 each; Refill: 5  - alcohol swabs pads; Use as directed  Dispense: 100  each; Refill: 5  - Insulin Pen Needle (PEN NEEDLES) 31G X 6 MM; Use two times a day as instructed.  Dispense: 100 each; Refill: 2  - [facility] (AC5)  - HM DIABETES FOOT EXAM    2. Health care maintenance  - Influenza 0.48mL prefilled syringe/single-dose vial (FluLaval, Fluzone, Afluria)  - Varicella-Zoster vaccine intramuscular(SHINGRIX)(Shingles)(AMBULATORY USE ONLY))      Orders Placed This Encounter    Influenza 0.31mL prefilled syringe/single-dose vial (FluLaval, Fluzone, Afluria)    Varicella-Zoster vaccine intramuscular(SHINGRIX)(Shingles)(AMBULATORY USE ONLY))    [facility] (AC5)    POCT Hemoglobin A1C    POCT HEMOGLOBIN A1C    HM DIABETES FOOT EXAM    blood glucose (ONETOUCH VERIO) test strip    Lancets 30G MISC    alcohol swabs pads    Insulin Pen Needle (PEN NEEDLES) 31G X 6 MM     Chronic issues:  HTN cont lisinopril 40mg  tab  HLD  atorva 20mg  daily  BPH cont flomax 0.8    Health Maintenance:  - Colonoscopy- last 2016; due 2021  - DEXA scan - deferred  - Lung Cancer - deferred   - Immunization status: first dose shingrix today, will need second at next visit; also got flu today  - AAA screening - deferred  - Hgb A1c - 13.4 -> 7 today    Follow up interval/topics:  -6 mo for DM f/u as well as weight eval.     Florian Buff, MD   Internal Medicine PGY-2  [facility] [place]  12/15/2017 10:31 AM           PCMH:  PCMH Diabetes Plan    According to current ADA guidelines the patient A1C goal is: less than 7  The patient's last A1C was 10/04/2017: Hemoglobin A1C 7%* (Ref range: %), the patient is: at goal.  Diabetic foot exam was performed:  Yes  The plan to reach goal   1.  Reviewed pt's understanding of medications including barriers to adherence    2.  Recommended lifestyle modifications: diet improvements and medication compliance   3.  The patient understands their clinical goals and will undertake self-management recommendations including: all   4.  Medications Management: no changes made   5.  Referral to Care Management:: No   6.  Patient Ed/Self Management tools provided: Current self-management tools adequate

## 2017-12-16 NOTE — Addendum Note (Signed)
Addended by: Salley Slaughter on: 12/16/2017 10:23 PM     Modules accepted: Level of Service

## 2018-01-14 ENCOUNTER — Other Ambulatory Visit: Payer: Self-pay | Admitting: Internal Medicine

## 2018-01-14 DIAGNOSIS — E119 Type 2 diabetes mellitus without complications: Secondary | ICD-10-CM

## 2018-01-14 NOTE — Telephone Encounter (Signed)
Medication request routed to Dr Hildred Alamin for processing 01/14/2018 1:28 PM

## 2018-02-05 ENCOUNTER — Other Ambulatory Visit: Payer: Self-pay | Admitting: Internal Medicine

## 2018-02-07 NOTE — Telephone Encounter (Signed)
[place] team MD not available  Routing request to practice grouper 02/07/2018 1:14 PM

## 2018-02-17 ENCOUNTER — Telehealth: Payer: Self-pay

## 2018-02-17 NOTE — Telephone Encounter (Signed)
This entry is being made to back-up a verbal provided to the [facility] so that medications could be released to patient 02/07/2018    Patient has Medicare and has Presumptive Financial Assistance Eligibility   Patient cannot afford co-pays today  FAP Application and brochure mailed to patient    [place] date:  February 17, 2018    Patient Name: Tristan Mcbride      Medical Record #:  [id number]       DOB:  September 27, 1945      Patients Address:   [place], [address]                Social Worker: Bradley Ferris, [place]     Office  [phone]     Date of Service:  February 07 2018     Funding Source: [place]  ___________________________________________________________________    Pharmacy Information:  Date/time sent: October 28 209   Time needed: 02/07/2018    Patient Location: [facility]   Medication Pick-up Preference: Patient will pick up at the pharmacy    Pharmacy Contact: [place] work signature: Bradley Ferris, [place]  X [phone]  Supervisor/Manager Approval (if indicated):  Date:  (supervisor signature not required for [facility] pending)    Pharmacy Fax # [phone]  Tube [phone]  Print form and fax or tube to pharmacy.  If it requires Supervisor approval copy and email to supervisor

## 2018-03-08 ENCOUNTER — Other Ambulatory Visit: Payer: Self-pay | Admitting: Internal Medicine

## 2018-03-08 DIAGNOSIS — E119 Type 2 diabetes mellitus without complications: Secondary | ICD-10-CM

## 2018-03-08 MED ORDER — GLUCOSE BLOOD VI STRP *A*
ORAL_STRIP | 0 refills | Status: DC
Start: 2018-03-08 — End: 2018-03-16

## 2018-03-08 MED ORDER — PEN NEEDLES 31G X 6 MM MISC *A*
2 refills | Status: DC
Start: 2018-03-08 — End: 2018-03-16

## 2018-03-08 MED ORDER — INSULIN GLARGINE 100 UNIT/ML SC SOPN *I*
12.0000 [IU] | PEN_INJECTOR | Freq: Every evening | SUBCUTANEOUS | 0 refills | Status: DC
Start: 2018-03-08 — End: 2018-03-14

## 2018-03-08 NOTE — Telephone Encounter (Incomplete)
Copied from [place] [id number]. Topic: Medications/Prescriptions - Medication Question/Problem  >> Mar 08, 2018 11:48 AM Verda Cumins wrote:  [name] (wife) is requesting early refills on insulin, test strips, an needles and wants extra supply because patient is traveling to [place] on 12/10 and returning 12/24.  Please send to [facility].    Patient is out of test strips.    Carmen calling to request prescription(s)   Requested Prescriptions     Pending Prescriptions Disp Refills    insulin glargine (LANTUS SOLOSTAR) 100 unit/mL injection pen 10.8 mL 0     Sig: Inject 12 Units into the skin nightly    Insulin Pen Needle (PEN NEEDLES) 31G X 6 MM 100 each 2     Sig: Use two times a day as instructed.    blood glucose (ONETOUCH VERIO) test strip 100 strip 0     Sig: Test  *** times a day.   Brand name of strips ***      to be sent to the following [facility]    Is patient out of the medication? no      Patient can be reached if necessary at [phone] (W).          Carmen's contact number is [phone]

## 2018-03-08 NOTE — Telephone Encounter (Signed)
Dr Hildred Alamin unavailable, routed to today's D Team Grouper for processing 03/08/2018 1:14 PM

## 2018-03-14 ENCOUNTER — Other Ambulatory Visit: Payer: Self-pay | Admitting: Internal Medicine

## 2018-03-14 MED ORDER — INSULIN GLARGINE 100 UNIT/ML SC SOPN *I*
12.0000 [IU] | PEN_INJECTOR | Freq: Every evening | SUBCUTANEOUS | 0 refills | Status: DC
Start: 2018-03-14 — End: 2018-04-12

## 2018-03-14 NOTE — Telephone Encounter (Signed)
Copied from [place] [address] [id number]. Topic: Medications/Prescriptions - Refill Request  >> Mar 14, 2018  9:21 AM Ramadan Couey, [place] Patient wrote:  Halden Phegley calling to request prescription(s) insulin glargine (LANTUS SOLOSTAR) 100 unit/mL injection pen   to be sent to the [facility].    Is patient out of the medication? yes      Patient can be reached if necessary at [phone].

## 2018-03-14 NOTE — Telephone Encounter (Signed)
Dr Hildred Alamin unavailable, routed to today's D Team Grouper for processing 03/14/2018 11:10 AM

## 2018-03-16 ENCOUNTER — Other Ambulatory Visit: Payer: Self-pay | Admitting: Student in an Organized Health Care Education/Training Program

## 2018-03-16 MED ORDER — PEN NEEDLES 31G X 6 MM MISC *A*
2 refills | Status: DC
Start: 2018-03-16 — End: 2018-07-28

## 2018-03-16 MED ORDER — GLUCOSE BLOOD VI STRP *A*
ORAL_STRIP | 0 refills | Status: DC
Start: 2018-03-16 — End: 2018-06-15

## 2018-03-16 NOTE — Telephone Encounter (Signed)
Copied from [place] [id number]. Topic: Medications/Prescriptions - Medication Question/Problem  >> Mar 16, 2018  9:03 AM Verda Cumins wrote:  [name] (wife) is requesting the pen needles and test strips [facility].  Patient is leaving for [place] next week and needs to bring the meds with him.    Insulin was already refilled (see 12/2 encounter)    Carmen's contact number is [phone]

## 2018-03-16 NOTE — Telephone Encounter (Signed)
[place] team MD not available  Routing request to practice grouper 03/16/2018 2:57 PM

## 2018-04-03 ENCOUNTER — Other Ambulatory Visit: Payer: Self-pay | Admitting: Internal Medicine

## 2018-04-12 ENCOUNTER — Other Ambulatory Visit: Payer: Self-pay | Admitting: Student in an Organized Health Care Education/Training Program

## 2018-04-26 ENCOUNTER — Encounter: Payer: Medicare (Managed Care) | Admitting: Optometry

## 2018-04-26 NOTE — Progress Notes (Signed)
This encounter was created in error - please disregard.

## 2018-04-29 ENCOUNTER — Other Ambulatory Visit: Payer: Self-pay | Admitting: Internal Medicine

## 2018-05-24 ENCOUNTER — Telehealth: Payer: Self-pay

## 2018-05-24 NOTE — Telephone Encounter (Signed)
Patient unable to afford medicare copays.  Currently has a deductible.  Pt currently has FAP approved at 100% until 05/15/19.  Patient requesting voucher so that medications can be released  Verbal called into the [facility] authorizing release of needed medication  Patient aware of and in agreement with plan   -------------------    [place] date:  May 24, 2018    Patient Name: Tristan Mcbride Record #:  [id number]       DOB:  03-12-1946      Patients Address:   [place], [address]                Social Worker: Ok Edwards, [place]  [phone]     Date of Service: May 24, 2018       Funding Source: [place]  ___________________________________________________________________    Pharmacy Information:  Date/time sent: May 24, 2018     Time needed: Today    Patient Location: [facility]   Medication Pick-up Preference: Patient will pick up at the pharmacy    Pharmacy Contact: [place] work signature: Ok Edwards, [place]  X [phone]  Supervisor/Manager Approval (if indicated): [place] Mixer  Date: 05/24/18  (supervisor signature not required for [facility] pending)    Pharmacy Fax # [phone]  Tube [phone]  Print form and fax or tube to pharmacy.  If it requires Supervisor approval copy and email to supervisor

## 2018-05-31 ENCOUNTER — Other Ambulatory Visit: Payer: Self-pay | Admitting: Internal Medicine

## 2018-05-31 NOTE — Progress Notes (Signed)
Pneumovax-Booster                                                                              If > 71 yo   Overdue: Yes

## 2018-06-01 ENCOUNTER — Ambulatory Visit: Payer: Medicare (Managed Care) | Attending: Internal Medicine | Admitting: Internal Medicine

## 2018-06-01 ENCOUNTER — Other Ambulatory Visit: Payer: Self-pay | Admitting: Internal Medicine

## 2018-06-01 ENCOUNTER — Other Ambulatory Visit: Payer: Self-pay | Admitting: Hospitalist

## 2018-06-01 ENCOUNTER — Encounter: Payer: Self-pay | Admitting: Internal Medicine

## 2018-06-01 VITALS — BP 94/68 | HR 68 | Temp 97.2°F | Ht 69.02 in | Wt 142.4 lb

## 2018-06-01 DIAGNOSIS — Z794 Long term (current) use of insulin: Secondary | ICD-10-CM | POA: Insufficient documentation

## 2018-06-01 DIAGNOSIS — E785 Hyperlipidemia, unspecified: Secondary | ICD-10-CM | POA: Insufficient documentation

## 2018-06-01 DIAGNOSIS — E119 Type 2 diabetes mellitus without complications: Secondary | ICD-10-CM | POA: Insufficient documentation

## 2018-06-01 DIAGNOSIS — R634 Abnormal weight loss: Secondary | ICD-10-CM

## 2018-06-01 DIAGNOSIS — N400 Enlarged prostate without lower urinary tract symptoms: Secondary | ICD-10-CM

## 2018-06-01 DIAGNOSIS — Z0000 Encounter for general adult medical examination without abnormal findings: Secondary | ICD-10-CM | POA: Insufficient documentation

## 2018-06-01 DIAGNOSIS — I10 Essential (primary) hypertension: Secondary | ICD-10-CM

## 2018-06-01 DIAGNOSIS — E1165 Type 2 diabetes mellitus with hyperglycemia: Secondary | ICD-10-CM | POA: Insufficient documentation

## 2018-06-01 DIAGNOSIS — Z23 Encounter for immunization: Secondary | ICD-10-CM | POA: Insufficient documentation

## 2018-06-01 LAB — BASIC METABOLIC PANEL
Anion Gap: 11 (ref 7–16)
CO2: 27 mmol/L (ref 20–28)
Calcium: 10.2 mg/dL (ref 8.6–10.2)
Chloride: 102 mmol/L (ref 96–108)
Creatinine: 1.31 mg/dL — ABNORMAL HIGH (ref 0.67–1.17)
GFR,Black: 63 *
GFR,Caucasian: 55 * — AB
Glucose: 101 mg/dL — ABNORMAL HIGH (ref 60–99)
Lab: 29 mg/dL — ABNORMAL HIGH (ref 6–20)
Potassium: 4.8 mmol/L (ref 3.3–5.1)
Sodium: 140 mmol/L (ref 133–145)

## 2018-06-01 LAB — LIPID PANEL
Chol/HDL Ratio: 2
Cholesterol: 119 mg/dL
HDL: 61 mg/dL — ABNORMAL HIGH (ref 40–60)
LDL Calculated: 47 mg/dL
Non HDL Cholesterol: 58 mg/dL
Triglycerides: 53 mg/dL

## 2018-06-01 LAB — PSA (EFF.4-2010): PSA (eff. 4-2010): 10.17 ng/mL — ABNORMAL HIGH (ref 0.00–4.00)

## 2018-06-01 LAB — TSH: TSH: 0.7 u[IU]/mL (ref 0.27–4.20)

## 2018-06-01 LAB — HEMOGLOBIN A1C: Hemoglobin A1C: 5.7 % — ABNORMAL HIGH

## 2018-06-01 MED ORDER — LISINOPRIL 20 MG PO TABS *I*
20.0000 mg | ORAL_TABLET | Freq: Every day | ORAL | 1 refills | Status: DC
Start: 2018-06-01 — End: 2018-07-08

## 2018-06-01 MED ORDER — METFORMIN HCL 500 MG PO TABS *I*
1000.0000 mg | ORAL_TABLET | Freq: Two times a day (BID) | ORAL | 1 refills | Status: DC
Start: 2018-06-01 — End: 2018-07-15

## 2018-06-01 MED ORDER — ATORVASTATIN CALCIUM 20 MG PO TABS *I*
20.0000 mg | ORAL_TABLET | Freq: Every day | ORAL | 1 refills | Status: DC
Start: 2018-06-01 — End: 2018-07-13

## 2018-06-01 MED ORDER — TAMSULOSIN HCL 0.4 MG PO CAPS *I*
0.4000 mg | ORAL_CAPSULE | Freq: Every evening | ORAL | 1 refills | Status: DC
Start: 2018-06-01 — End: 2018-07-28

## 2018-06-01 NOTE — Progress Notes (Signed)
[facility] Progress Note  Reason For Visit: No chief complaint on file.    Subjective   Tristan Mcbride is 73 y.o. man with medical history of HTN, T2DM, HLD, BPH, GERD presenting for f/up.  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   Allergies reviewed and reconciled   Objective     Vitals:    06/01/18 0840   BP: 94/68   Pulse: 68   Temp: 36.2 C (97.2 F)   TempSrc: Temporal   Weight: 64.6 kg (142 lb 6.4 oz)   Height: 1.753 m (5' 9.02")     Physical Examination:  Gen: Thin read-as latino man sitting in chair in NAD, w wife at side  HEENT: NC/AT; no temporal wasting  CV: RRR; no m/r/g  Resp: CTAB posteriorly w/o adventitious sounds  Abd: NT/ND, NABS  Extrem: no wasting of dorsum of hands  Integ: no lesions on head, neck, hands bilaterally  Neuro: alert and oriented to conversation via telephone  Psych: pleasant  Objective   Laboratory Reviewed. Pertinent results as follows:  -A1C last 7 6 mo ago  - no protein gap  - PSA in 7s in the past    Imaging Reviewed. Pertinent results as follows:  -none     Assessment   Lavaughn Haberle is 73 y.o. man with medical history of HTN, T2DM, HLD, BPH, GERD presenting for f/up.    Weight loss is concerning and elevated PSA in past most concerning for me. Also at time of next screening colonoscopy - will pursue both via PSA and scheduling screening colonoscopy at this visit. Glycemic control improved with dietary cahnge (healthier) but also improved metabolism with less  adiposity. To risk reduce, will decrease insulin from 12->6 nightly. Otherwise will obtain lipid, flu, ppv23 for HCM.    For ongoing weight loss will also get BMP (on prior hyperCa value), TSH, hemoglobin A1C.     Plan   Active and discussed issues:  1. Type 2 diabetes mellitus  - Hemoglobin A1c; Future  - Hemoglobin A1c    2. Hyperlipidemia, unspecified hyperlipidemia type  - atorvastatin (LIPITOR) 20 MG tablet; Take 1 tablet (20 mg total) by mouth daily (with dinner)  Dispense: 90 tablet; Refill: 1  - Lipid Panel (Reflex to Direct  LDL if Triglycerides more than 400); Future  - Lipid Panel (Reflex to Direct  LDL if Triglycerides more than 400)    3. Hypertension, unspecified type  - lisinopril (PRINIVIL,ZESTRIL) 20 MG tablet; Take 1 tablet (20 mg total) by mouth daily  Dispense: 180 tablet; Refill: 1    4. Immunization due  - Pneumococcal polysaccharide vaccine 23-valent greater than or equal to 2yo subcutaneous/IM  - Flu Vaccine High Dose, greater than or equal to 62 yo, preservative free    5. Type 2 diabetes mellitus with hyperglycemia, with long-term current use  of insulin  - metFORMIN (GLUCOPHAGE) 500 MG tablet; Take 2 tablets (1,000 mg total) by mouth 2 times daily (with meals)  Dispense: 360 tablet; Refill: 1    6. Benign prostatic hyperplasia, unspecified whether lower urinary tract symptoms present  - tamsulosin (FLOMAX) 0.4 MG capsule; Take 1 capsule (0.4 mg total) by mouth every evening  Dispense: 90 capsule; Refill: 1  - PSA (eff.07-2008); Future  - PSA (eff.07-2008)    7. Weight loss  - TSH; Future  - Basic metabolic panel; Future  - PSA (eff.07-2008); Future  - PSA (eff.07-2008)  - Basic metabolic panel  - TSH  - AMB REFERRAL TO GASTROENTEROLOGY    8. Health care maintenance  - AMB REFERRAL TO GASTROENTEROLOGY      Orders Placed This Encounter    Pneumococcal polysaccharide vaccine 23-valent greater than or equal to 2yo subcutaneous/IM    Flu Vaccine High Dose, greater than or equal to 12 yo,  preservative free    Hemoglobin A1c    TSH    Lipid Panel (Reflex to Direct  LDL if Triglycerides more than 400)    Basic metabolic panel    PSA ([id number])    AMB REFERRAL TO GASTROENTEROLOGY    lisinopril (PRINIVIL,ZESTRIL) 20 MG tablet    metFORMIN (GLUCOPHAGE) 500 MG tablet    tamsulosin (FLOMAX) 0.4 MG capsule    atorvastatin (LIPITOR) 20 MG tablet     Health Maintenance:  - Colonoscopy- last 2016; due 2021  - DEXA scan- deferred  - Lung Cancer - deferred  - Immunization status: first dose shingrix today, will need second at next visit; also got flu today  - AAA screening - deferred  - Hgb A1c - 13.4 -> 7 -> 5.7 today  - Advance care planning (HCP/MOLST) - not discussed    Follow up interval/topics:  -3 mo for follow up on weight primarily, but also DM.     Florian Buff, MD   Internal Medicine PGY-2  [facility] [phone]  06/02/2018 1:35 PM

## 2018-06-02 ENCOUNTER — Other Ambulatory Visit: Payer: Self-pay | Admitting: Internal Medicine

## 2018-06-03 NOTE — Addendum Note (Signed)
Addended by: Salley Slaughter on: 06/03/2018 10:00 AM     Modules accepted: Level of Service

## 2018-06-10 ENCOUNTER — Other Ambulatory Visit: Payer: Self-pay | Admitting: Student in an Organized Health Care Education/Training Program

## 2018-06-10 DIAGNOSIS — E119 Type 2 diabetes mellitus without complications: Secondary | ICD-10-CM

## 2018-06-13 NOTE — Telephone Encounter (Signed)
Rx request forwarded to Dr. Hildred Alamin for processing 06/13/2018 3:44 PM

## 2018-06-17 ENCOUNTER — Telehealth: Payer: Self-pay

## 2018-06-17 ENCOUNTER — Telehealth: Payer: Self-pay | Admitting: Physician Assistant

## 2018-06-17 ENCOUNTER — Ambulatory Visit
Admission: RE | Admit: 2018-06-17 | Discharge: 2018-06-17 | Disposition: A | Discharge status: Home / Self Care | Payer: Medicare (Managed Care) | Source: Ambulatory Visit | Attending: Nurse Practitioner | Admitting: Nurse Practitioner

## 2018-06-17 VITALS — BP 108/63 | HR 81 | Ht 67.0 in | Wt 138.7 lb

## 2018-06-17 DIAGNOSIS — R1310 Dysphagia, unspecified: Secondary | ICD-10-CM

## 2018-06-17 DIAGNOSIS — R1319 Other dysphagia: Secondary | ICD-10-CM

## 2018-06-17 DIAGNOSIS — R634 Abnormal weight loss: Secondary | ICD-10-CM

## 2018-06-17 MED ORDER — POLYETHYLENE GLYCOL 3350 PO PACK 17 GM *I*
17.0000 g | PACK | Freq: Once | ORAL | 0 refills | Status: AC
Start: 2018-06-17 — End: 2018-06-17

## 2018-06-17 NOTE — Discharge Instructions (Signed)
Schedule EGD and colonoscopy,   Will send Miralax to the pharmacy, use this to clean out your bowels before colonoscopy  Continue to chew food well, drink water frequently as you eat

## 2018-06-17 NOTE — Telephone Encounter (Signed)
Patient needs EGD and colonoscopy with Miralax prep, moderste sedation 4 weeks

## 2018-06-17 NOTE — Telephone Encounter (Unsigned)
Copied from [place] [id number]. Topic: Medications/Prescriptions - Medication Question/Problem  >> Jun 17, 2018 10:04 AM Williams Che wrote:  Fara Olden from [facility] calling in regards to medication polyethylene glycol [facility]) powder packet. He wanted to confirm if this is for a colonoscopy or not. If so, the size and directions may be incorrect. Fara Olden can be reached back at [phone]

## 2018-06-17 NOTE — H&P (Addendum)
Urgent Gastroenterology Initial Visit:    Tristan Mcbride  [id number]     06/17/2018  Florian Buff, [place]  [address]  [place], [place] [address]      Chief Complaint/Reason for Office Visit: Weight loss    We had the pleasure of seeing your patient, Tristan Mcbride, in the outpatient gastroenterology/hepatology clinic. As you know, he is a 73 y.o. male with a past medical history significant for GERD,Type II DM,HLD, BPH, elevated PSA who is referred to our office for weight loss. Pt is accompanied by his wife Tristan Mcbride.  A live spanish interpreter was utilized for interpretation needs.    He endorses weight loss of approximately 14 lbs over the past 5 months. Correlates weight loss to timing of DM diagnosis. He has an appetite but has made dietary changes to control his DM including only eating small portions, he has cut back on rice, beans and has totally eliminated candy and sweets from his diet. He was seen by his PCP, labs from 06/01/2018 significant for an elevated PSA 10.17, normal TSH 0.70, elevated BUN/creat 29/1.31 and normal H/H 14.4/44 (June 2019).    He endorses + dysphagia, present for years (unable to specify). He recalls eating solids and the food becoming lodged in is mid esophagus region. He since started drinking water prior to eating and throughout the course of the meal and no further issue occurred.   He denies an issue with liquids.  Denies symptoms of GERD, heartburn, nausea, abdominal pain. 1 episode of emesis 2 weeks ago, occurred after eating salmon. Once he vomited, no further issue.     He moves his bowels every 2 days, small amount ,stool is soft yellow/brown, no melena or hematochezia. Denies the need to strain.     Colonoscopy performed 02/21/2015 by Dr. Scherrie Bateman which showed sigmoid erythema, but otherwise normal to cecum. Pathology revealed focal active colitis from sigmoid biopsy.    No previous EGD.    He denies ETOH use, non smoker and no illicit drug use.    Family history reviewed. No  pertinent family history of GI malignancy, IBD, liver disease, pancreatic disease, or celiac disease.      Allergies/Sensitivities:  Allergies   Allergen Reactions    Tetanus Toxoids      On Amb Int Med note from 08/20/09    No Known Latex Allergy        Medications:   Prior to Admission medications    Medication Sig Start Date End Date Taking? Authorizing Provider   blood glucose (ONETOUCH VERIO) test strip TEST TWO TIMES A DAY 06/15/18   Florian Buff, MD   insulin glargine (LANTUS SOLOSTAR) 100 unit/mL injection pen Inject 6 Units into the skin nightly 06/02/18 07/02/18  Florian Buff, MD   lisinopril (PRINIVIL,ZESTRIL) 20 MG tablet Take 1 tablet (20 mg total) by mouth daily 06/01/18   Florian Buff, MD   metFORMIN (GLUCOPHAGE) 500 MG tablet Take 2 tablets (1,000 mg total) by mouth 2 times daily (with meals) 06/01/18   Florian Buff, MD   tamsulosin [facility]) 0.4 MG capsule Take 1 capsule (0.4 mg total) by mouth every evening 06/01/18   Florian Buff, MD   atorvastatin (LIPITOR) 20 MG tablet Take 1 tablet (20 mg total) by mouth daily (with dinner) 06/01/18   Florian Buff, MD   Insulin Pen Needle (PEN NEEDLES) 31G X 6 MM Use two times a day as instructed. 03/16/18   Annabell Howells,  MD   Lancets 30G MISC Use as directed. Dispense lowest cost lancet based on patient's insurance. 12/15/17   Florian Buff, MD   alcohol swabs pads Use as directed 12/15/17   Florian Buff, MD   naproxen sodium (ANAPROX) 220 MG tablet Take 220 mg by mouth as needed    [provider]   omeprazole (PRILOSEC) 20 MG capsule Take 1 capsule (20 mg total) by mouth daily (before breakfast) 04/15/17 12/15/17  Florian Buff, MD       Past Medical Hx:   Past Medical History:   Diagnosis Date    BPH (benign prostatic hypertrophy)     DM (diabetes mellitus) 10/13/2017    Esophageal reflux     Fibrolipoma     intramuscular adipose tissue    Hyperlipidemia      Hypertension     Hypoglycemia     Kidney cyst     Rotator cuff tendinitis     Right    Sexual dysfunction     absent ejaculation    Type 2 diabetes mellitus     Weight loss        Past Surgical Hx:   Past Surgical History:   Procedure Laterality Date    KNEE SURGERY      left    LEG TENDON SURGERY      Right       Social Hx:   Social History     Tobacco Use    Smoking status: Never Smoker    Smokeless tobacco: Never Used   Substance Use Topics    Alcohol use: No       ROS:   General:  No malaise, fatigue, fever, chills, sweats.  HEENT:  No hearing loss, visual changes.  Cardiovascular:  No chest pain, palpitations.  Respiratory:  No dyspnea, cough.  Musculoskeletal:  No myalgias.  GI:  See above.  GU:  No dysuria, hematuria.  Skin:  No rash, pruritus, jaundice.  Neuro:   No focal numbness, weakness, tremor.  Psychiatric:  No confusion, depression.  Endocrine:  No heat or cold intolerance.  Heme/Lymph:  No easy bruising/bleeding.  No concerning lumps.  Allergy/Rheum:  No arthralgias/arthritis.  No rash/hives.     Vitals:   Vitals:    06/17/18 0810   BP: 108/63   Pulse: 81   Weight: 62.9 kg (138 lb 11.2 oz)   Height: 170.2 cm (5\' 7" )      Body mass index is 21.72 kg/m.    Physical Exam:   General: Pleasant, thin male, NAD  Skin:  No rashes, jaundice.  Warm and dry.   Lymphatics:  No peripheral adenopathy palpated in SCF or cervical chains.    HEENT:  No icterus.  No oropharyngeal abnormalities.    Neck:  No masses or tracheal deviation.  Supple.  Lungs:  Clear bilaterally to auscultation. Normal respiratory effort.    Cor:  RRR without murmur.    Abdomen: Normoactive bowel sounds x 4 quads. No dullness or tympany to percussion. No TTP x 4 quads with no guarding or rebound tenderness. No hepatosplenomegaly, hernias, masses, or ascites.   Extremities:  Warm, no edema.  Neuro:  Alert and oriented x 3 with appropriate affect.  Ambulatory.  No gross motor deficits noted.      GI Procedures:  02/21/2015  Colonoscopy  Findings:    Anorectal:   Normal tone, no masses  Terminal Ileum:   Normal ileal mucosa  Cecum:   Normal mucosa  Ascending Colon:   Normal mucosa  Transverse Colon:   Normal mucosa  Descending Colon:   Diverticulosis: scattered  Sigmoid:   Diverticulosis: scattered    Linear erythema ~5cm in length, s/p biopsy    Rectum:   Normal rectum throughout    Intervention(s):      Cytology/Biopsy: Biopsy taken sigmoid.    Complication(s):   none  Impression(s):     Sigmoid erythema   Otherwise normal colonoscopy to the cecum.    Recommendation(s):     Await pathology.   Repeat colonoscopy in 5 years    Surgical Pathology  FINAL DIAGNOSIS:   Colon, sigmoid, biopsy:   -Focal active colitis.     Labs/Imaging:      06/01/2018 09:50   Sodium 140   Potassium 4.8   Chloride 102   CO2 27   Anion Gap 11   UN 29 (H)   Creatinine 1.31 (H)   GFR,Black 63   GFR,Caucasian 55 (!)   Glucose 101 (H)   Calcium 10.2      06/01/2018 09:50   TSH 0.70        06/01/2018 09:50   PSA (eff. 07-2008) 10.17 (H)     Impression(s)/Recommendation(s):   73 y.o. male with a past medical history significant for GERD,Type II DM,HLD, BPH, elevated PSA who is referred to our office for 14 lb unintentional weight loss over the past 5 months.    1) Unintentional weight loss: Needs further endoscopic evaluation for potential GI source contributing. He does endorse esophageal dysphagia which needs further evaluation with EGD to evaluate for structural etiology which can include esophageal stricture due to GERD or possibly EOE, esophageal webs, infectious esophagitis, peptic stricture, PUD and carcinoma. Plan to  check duodenal biopsy with EGD to assess for malabsorptive disease, celiac. Last colonoscopy 2016 with recommendation for repeat in 5 yrs but will repeat now given weight loss to assess for CRC, polyps.   -Follow up with PCP regarding elevated PSA levels    2) Esophageal dysphagia:  -EGD as above, if unrevealing consider motility  disorder, ESM  -Hold off on PPI initiation at this point as he is without GERD symptoms, follow EGD results  -Dysphagia precautions discussed including taking small bites when he eats, alternate small bites with small sips, eating slowly and chewing food thoroughly. If food becomes obstructed and he is unable to clear in a reasonable time, pt advised he should seek ED evaluation       EGD with colonoscopy, moderate sedation, Miralax prep. Follow up with general GI APP in 3 months.     I thank you for allowing me to participate in this patient's care. Please do not hesitate to contact the clinic with any questions or concerns at [phone].    Kae Heller, NP

## 2018-06-20 MED ORDER — POLYETHYLENE GLYCOL 3350 PO POWD *I*
238.0000 g | Freq: Once | ORAL | 0 refills | Status: AC
Start: 2018-06-20 — End: 2018-06-20

## 2018-06-20 NOTE — Telephone Encounter (Signed)
Updated Miralax Rx sent to pharmacy for colonoscopy prep.

## 2018-07-08 ENCOUNTER — Other Ambulatory Visit: Payer: Self-pay | Admitting: Internal Medicine

## 2018-07-08 DIAGNOSIS — I10 Essential (primary) hypertension: Secondary | ICD-10-CM

## 2018-07-08 NOTE — Telephone Encounter (Signed)
Medication request routed to Dr Hildred Alamin for processing 07/08/2018 2:11 PM

## 2018-07-13 ENCOUNTER — Other Ambulatory Visit: Payer: Self-pay | Admitting: Internal Medicine

## 2018-07-13 DIAGNOSIS — E785 Hyperlipidemia, unspecified: Secondary | ICD-10-CM

## 2018-07-13 NOTE — Telephone Encounter (Signed)
[place] team MD not available  Routing request to practice grouper 07/13/2018 2:04 PM

## 2018-07-15 ENCOUNTER — Other Ambulatory Visit: Payer: Self-pay | Admitting: Internal Medicine

## 2018-07-15 DIAGNOSIS — Z794 Long term (current) use of insulin: Secondary | ICD-10-CM

## 2018-07-15 DIAGNOSIS — E1165 Type 2 diabetes mellitus with hyperglycemia: Secondary | ICD-10-CM

## 2018-07-15 NOTE — Telephone Encounter (Signed)
[place] team MD not available  Routing request to practice grouper 07/15/2018 3:15 PM

## 2018-07-20 ENCOUNTER — Telehealth: Payer: Self-pay | Admitting: Internal Medicine

## 2018-07-20 NOTE — Telephone Encounter (Signed)
Copied from [place] [id number]. Topic: Return Call - Speak to Provider/Office Staff  >> Jul 20, 2018  1:15 PM Kerby Nora wrote:  Tristan Mcbride is returning a call from thre office. He states this is regarding a missed call. He is requesting a call back at [phone].

## 2018-07-21 ENCOUNTER — Telehealth: Payer: Self-pay | Admitting: Internal Medicine

## 2018-07-21 ENCOUNTER — Telehealth: Payer: Self-pay

## 2018-07-21 NOTE — Telephone Encounter (Signed)
Patient called office with Interpreter Services in regards to scheduling appointment for a Telehome Visit with Dr.Haley,rescheduled with NP Lynett Fish for a Telehome      Patient stated he hasn't been able to go to the bathroom for 3 days now     Patient is asking if the provider can prescribe him something to help him go     Patient is asking if someone can please call him back [phone]    Patient will need Interpreter

## 2018-07-21 NOTE — Telephone Encounter (Signed)
Attempted to reach patient- LVM

## 2018-07-21 NOTE — Telephone Encounter (Signed)
Spoke to pt via [place] Int [id number].  States that he has not had a BM in three days --- is passing gas.  Denies any changes in diet --- says that he is eating and drinking the same as usual --- has not been constipated in a very long time.  No CP/SOB.  Writer advised Colace, Senna and Miralax OTC--- follow the instructions on the bottle --- contact SIM if no BM in 48 hrs.  Pt agreed with plan and verbalized understanding.

## 2018-07-21 NOTE — Telephone Encounter (Signed)
Writer called patient in regards to upcoming appointment to an telephone visit instead of office visit.   [facility] cares about patient's healthcare need and about protecting patient's from exposure to COVID-19 through unnecessary contact with others.    No answer on call

## 2018-07-28 ENCOUNTER — Ambulatory Visit: Payer: Medicare (Managed Care) | Attending: Physician Assistant | Admitting: Physician Assistant

## 2018-07-28 DIAGNOSIS — E119 Type 2 diabetes mellitus without complications: Secondary | ICD-10-CM

## 2018-07-28 DIAGNOSIS — R972 Elevated prostate specific antigen [PSA]: Secondary | ICD-10-CM

## 2018-07-28 DIAGNOSIS — R634 Abnormal weight loss: Secondary | ICD-10-CM

## 2018-07-28 DIAGNOSIS — I10 Essential (primary) hypertension: Secondary | ICD-10-CM

## 2018-07-28 DIAGNOSIS — N400 Enlarged prostate without lower urinary tract symptoms: Secondary | ICD-10-CM

## 2018-07-28 DIAGNOSIS — K5900 Constipation, unspecified: Secondary | ICD-10-CM

## 2018-07-28 MED ORDER — LANCETS 30G MISC
5 refills | Status: DC
Start: 2018-07-28 — End: 2020-02-27

## 2018-07-28 MED ORDER — GLUCOSE BLOOD VI STRP *A*
ORAL_STRIP | 0 refills | Status: DC
Start: 2018-07-28 — End: 2018-09-26

## 2018-07-28 MED ORDER — LISINOPRIL 20 MG PO TABS *I*
20.0000 mg | ORAL_TABLET | Freq: Every day | ORAL | 1 refills | Status: DC
Start: 2018-07-28 — End: 2019-07-20

## 2018-07-28 MED ORDER — TAMSULOSIN HCL 0.4 MG PO CAPS *I*
0.4000 mg | ORAL_CAPSULE | Freq: Every evening | ORAL | 1 refills | Status: DC
Start: 2018-07-28 — End: 2019-01-23

## 2018-07-28 MED ORDER — SENNOSIDES 8.6 MG PO TABS *I*
1.0000 | ORAL_TABLET | Freq: Every day | ORAL | 1 refills | Status: DC
Start: 2018-07-28 — End: 2018-11-02

## 2018-07-28 MED ORDER — ALCOHOL SWABS PADS *I*
MEDICATED_PAD | 5 refills | Status: DC
Start: 2018-07-28 — End: 2021-10-31

## 2018-07-28 MED ORDER — PSYLLIUM 28.3 % PO POWD *I*
ORAL | 3 refills | Status: DC
Start: 2018-07-28 — End: 2018-11-02

## 2018-07-28 NOTE — Progress Notes (Signed)
Telephone Visit     This is an established patient visit.    Reason for visit: follow up of chronic medical problems.       HPI:    Spoke to patient via [place] interpreter.     Constipation  - he reports this has resolved  - last bowel movement was today. Also had a BM yesterday.   - he is drinking prune juice   - not on any stool softeners.   - metamucil is also helpful    T2DM   - tells me this is "good."   - he checks in the morning and at night.   - fasting sugars: this morning was 106. Other fasting was 93, 99  - last night BG was 104, 170  - denies hypoglycemia.  - on lantus 6 units nightly. On metformin 1000 mg BID     HTN  - does not check blood pressure  - he is on lisinopril   - denies chest pain or sob  - no headaches or dizziness     Weight loss  - seen by GI for unintentional weight loss over the past 5 months.   - he is at the same weight of 142 lbs   - no dysphagia  - no nausea or vomiting.     Elevated PSA levels   - no difficulty urinating, but if he does not take flomax, he has difficulty.   - denies dysuria or hematuria  - no fevers or chills   - he has some pain when he is sitting down. Feels uncomfortable.   - he is unsure of any prostate cancer in his family.     Patient's problem list, allergies, and medications were reviewed and updated as appropriate. Please see the EHR for full details.    Assessment Plan:    Constipation   - improved  - start senna 8.6 mg daily   - start metamucil daily   - water and fiber intake encouraged     T2DM   - last a1c was 5.7  - stop lantus   - continue metformin 1000 mg daily   - continue home BG monitoring     Unintentional weight loss   - seen by GI who recommended EGD with duodenal biopsy and colonoscopy     Elevated PSA level   - recent PSA 10.17 from normal ~7, weight loss, and LUTS on flomax. Since change of PSA level, will refer to urology   - continue flomax 0.4 mg daily     HTN   - not checking BPs so unsure of BP readings   - continue lisinopril 20  mg daily   - continue atorvastatin 20 mg daily     The plan was discussed with the patient and the patient/patient rep demonstrated understanding to the provider's satisfaction.    Consent was previously obtained from the patient to complete this telephone consult; including the potential for financial liability.    >21 minutes was spent on the phone with the patient, patient representatives, and/or other attendees.     Follow up: 3 months     Tristan Mcbride, [place]   07/28/2018 3:35 PM

## 2018-08-01 ENCOUNTER — Other Ambulatory Visit: Payer: Self-pay | Admitting: Internal Medicine

## 2018-08-01 DIAGNOSIS — E119 Type 2 diabetes mellitus without complications: Secondary | ICD-10-CM

## 2018-08-01 NOTE — Telephone Encounter (Signed)
Copied from [place] [id number]. Topic: Medications/Prescriptions - Refill Request  >> Aug 01, 2018 10:52 AM Tilden Dome wrote:  Tristan Mcbride calling to request prescription(s) lancets and the diabetic testing strips to be sent to the following Pharmacy [facility] .    Writer sees the prescriptions that were sent to [facility] on 4/16.     [name] states normally she picks up at [facility] and was advised they did not have the prescriptions.    Is patient out of the medication? Yes -       Patient can be reached if necessary at [phone].

## 2018-08-03 ENCOUNTER — Ambulatory Visit: Payer: Medicare (Managed Care) | Admitting: Internal Medicine

## 2018-08-05 NOTE — Telephone Encounter (Signed)
Spoke with patient regarding phone call  Patient stated he was all set

## 2018-08-24 ENCOUNTER — Telehealth: Payer: Self-pay

## 2018-08-24 NOTE — Telephone Encounter (Signed)
This is to back up verbal voucher that was placed on 06/14/18 for pt.  -------------------    [place] date:  Aug 24, 2018    Patient Name: Tristan Mcbride      Medical Record #:  [id number]       DOB:  03-30-1946      Patients Address:   [address], [place] [address]                Social Worker: Ok Edwards, [place]     Office  [phone]     Date of Service: Aug 24, 2018       Funding Source: [place]  ___________________________________________________________________    Pharmacy Information:  Date/time sent: Aug 24, 2018     Time needed: Today    Patient Location: [facility]   Medication Pick-up Preference: Patient will pick up at the pharmacy    Pharmacy Contact: [place] work signature: Ok Edwards, LMSW  X [phone]  Supervisor/Manager Approval (if indicated):  Date:  (supervisor signature not required for [facility] pending)    Pharmacy Fax # [phone]  Tube [phone]  Print form and fax or tube to pharmacy.  If it requires Supervisor approval copy and email to supervisor

## 2018-08-29 ENCOUNTER — Telehealth: Payer: Self-pay | Admitting: Student in an Organized Health Care Education/Training Program

## 2018-08-29 NOTE — Telephone Encounter (Signed)
Copied from [place] [id number]. Topic: Medications/Prescriptions - Medication Question/Problem  >> Aug 29, 2018  1:27 PM [place] wrote:    Interpreter call.    The patient is calling to find out how to use the powder he was given to prepare for his 5/26 colonoscopy appointment.    The patient can be reached at [phone].

## 2018-09-01 NOTE — Telephone Encounter (Unsigned)
Copied from [place] [id number]. Topic: Appointments - Appointment Information  >> Sep 01, 2018  1:08 PM Harland Dingwall wrote:  Urology dept called and the Pt called the Urology dept looking for his colonoscopy prep.  Pt did and does not have an appt for a colonoscopy.    Please call him with a spanish interpreter.   Marland Kitchen

## 2018-09-01 NOTE — Telephone Encounter (Signed)
Called and left message on machine that he does not need to have a powder for his appt on 09/06/2018, but he will need it for his Gastro appt on 09/26/18, and to call Dr. Leanor Rubenstein office. [place] N[facility]'s office to notify that pt has questions.

## 2018-09-06 ENCOUNTER — Ambulatory Visit: Payer: Medicare (Managed Care) | Admitting: Student in an Organized Health Care Education/Training Program

## 2018-09-06 ENCOUNTER — Encounter: Payer: Self-pay | Admitting: Student in an Organized Health Care Education/Training Program

## 2018-09-06 VITALS — BP 136/83 | HR 75

## 2018-09-06 DIAGNOSIS — R972 Elevated prostate specific antigen [PSA]: Secondary | ICD-10-CM

## 2018-09-06 DIAGNOSIS — N400 Enlarged prostate without lower urinary tract symptoms: Secondary | ICD-10-CM

## 2018-09-06 LAB — POCT URINALYSIS DIPSTICK
Blood,UA POCT: NEGATIVE
Glucose,UA POCT: NORMAL mg/dL
Ketones,UA POCT: NEGATIVE mg/dL
Leuk Esterase,UA POCT: NEGATIVE
Lot #: 41518004
Nitrite,UA POCT: NEGATIVE
PH,UA POCT: 5 (ref 5–8)
Protein,UA POCT: NEGATIVE mg/dL
Specific gravity,UA POCT: 1.02 (ref 1.002–1.030)

## 2018-09-09 ENCOUNTER — Telehealth: Payer: Self-pay | Admitting: Urology

## 2018-09-09 NOTE — Telephone Encounter (Signed)
Patient scheduled for MRI @ [id number] on 8/13 @ 10:30 am--mailed letter

## 2018-09-14 ENCOUNTER — Telehealth: Payer: Self-pay

## 2018-09-14 NOTE — Telephone Encounter (Signed)
Call Outcome: Spoke with patient, confirmed Telehome visit on date 09/23/2018 at or around 8:30am.    In the midst of the [facility] crisis, [facility] has initiated a pandemic response.  [facility] is postponing all elective procedures and clinic visits. Do not come to your scheduled appointment.  A provider has reviewed your chart and will contact you around the time of your previously scheduled appointment. Please be aware that you may be called from a private number. Confirmation of this message is required to avoid cancellation of your appointment, please call us back at [phone].    What is the best number to contact you?   Phone number: [phone]

## 2018-09-14 NOTE — Assessment & Plan Note (Signed)
-   PSA has increased from 7.91 in 2015 to 10.17 in February 2020  - He previously underwent a biopsy in 2012 that was negative for cancer  - He would be interested in prostate cancer treatment if cancer was found and he appears to be healthy  - MRI ordered today will schedule for fusion biopsy if targetable lesion is identified otherwise we will plan for just systematic biopsy

## 2018-09-14 NOTE — Progress Notes (Signed)
Urology Outpatient Note    Chief complaint: Elevated PSA, BPH    History of present illness:    Today Tristan Mcbride presents with a Spanish interpreter and is here to discuss an elevated PSA.    He underwent a biopsy previously in 2012 that did not reveal any signs of cancer.  The biopsy did show BPH.  He is currently on Flomax 0.4 mg.  He is happy with how he is urinating.    His PSA has been increasing and his previous PSAs are as follows:     Ref. Range 09/04/2010 08:00 09/02/2011 17:16 09/22/2012 16:57 11/27/2013 12:28 06/01/2018 09:50   PSA (eff. 07-2008) Latest Ref Range: 0.00 - 4.00 ng/mL 7.77 (H) 6.45 (H) 7.34 (H) 7.91 (H) 10.17 (H)       REVIEW of SYSTEMS    Review of Systems   Constitutional: Negative.    HENT: Negative.    Eyes: Negative.    Respiratory: Negative.    Cardiovascular: Negative.    Gastrointestinal: Negative.    Genitourinary:        Per HPI   Musculoskeletal: Negative.    Skin: Negative.    Neurological: Negative.    Endo/Heme/Allergies: Negative.    Psychiatric/Behavioral: Negative.        Vital signs: Blood pressure 136/83, pulse 75.    Physical examination:  General : Normal  Neurologic: Oriented to person,place, time  Psychiatric : Normal mood and affect  Skin : Normal color, turgor, texture, hydration  Neck : Supple  Respiratory : normal respiratory effort. Normal work of breathing.   Uro: no CVA tenderness, smooth prostate without nodules  Ext: no edema  Abd: soft    Medications:   Current Outpatient Medications   Medication    lisinopril (PRINIVIL,ZESTRIL) 20 MG tablet    blood glucose (ONETOUCH VERIO) test strip    Lancets 30G MISC    tamsulosin (FLOMAX) 0.4 MG capsule    alcohol swabs pads    metFORMIN (GLUCOPHAGE) 500 MG tablet    atorvastatin (LIPITOR) 20 MG tablet    senna (SENOKOT) 8.6 MG tablet    psyllium (METAMUCIL SMOOTH TEXTURE) 28.3 % POWD powder    naproxen sodium (ANAPROX) 220 MG tablet     No current facility-administered medications for this visit.        Allergies:    Allergies   Allergen Reactions    Tetanus Toxoids      On Amb Int Med note from 08/20/09    No Known Latex Allergy        Past medical history:   Past Medical History:   Diagnosis Date    BPH (benign prostatic hypertrophy)     DM (diabetes mellitus) 10/13/2017    Esophageal reflux     Fibrolipoma     intramuscular adipose tissue    Hyperlipidemia     Hypertension     Hypoglycemia     Kidney cyst     Rotator cuff tendinitis     Right    Sexual dysfunction     absent ejaculation    Type 2 diabetes mellitus     Weight loss        Past surgical history:   Past Surgical History:   Procedure Laterality Date    KNEE SURGERY      left    LEG TENDON SURGERY      Right       Family history:   Family History   Problem Relation Age of Onset  Heart Disease Mother     Diabetes Mother     Diabetes Father     Stroke Father     Cerebral Palsy Father     Breast cancer Maternal Aunt     Colon cancer Neg Hx     Esophageal cancer Neg Hx     Liver cancer Neg Hx     Pancreatic Cancer Neg Hx     Rectal cancer Neg Hx     Stomach cancer Neg Hx        Social History:   Social History     Socioeconomic History    Marital status: Married     Spouse name: Not on file    Number of children: Not on file    Years of education: Not on file    Highest education level: Not on file   Occupational History    Not on file   Tobacco Use    Smoking status: Never Smoker    Smokeless tobacco: Never Used   Substance and Sexual Activity    Alcohol use: No    Drug use: No    Sexual activity: Yes     Partners: Female   Social History Narrative    Not on file         Urine analysis shows : No results found for this or any previous visit (from the past 24 hour(s)).      Radiologic evaluation included: none    Assessment/Plan:    This is a 73 y.o. male who is presenting today for the following:     BPH without obstruction/lower urinary tract symptoms  -Prostate biopsy in 2012 showed BPH  -Mild let symptoms have been adequately  controlled with Flomax 0.4  -No adjustment for treatment of BPH today     Elevated PSA  - PSA has increased from 7.91 in 2015 to 10.17 in February 2020  - He previously underwent a biopsy in 2012 that was negative for cancer  - He would be interested in prostate cancer treatment if cancer was found and he appears to be healthy  - MRI ordered today will schedule for fusion biopsy if targetable lesion is identified otherwise we will plan for just systematic biopsy         Cheron Every, MD

## 2018-09-14 NOTE — Assessment & Plan Note (Signed)
-  Prostate biopsy in 2012 showed BPH  -Mild let symptoms have been adequately controlled with Flomax 0.4  -No adjustment for treatment of BPH today

## 2018-09-23 ENCOUNTER — Telehealth: Payer: Self-pay | Admitting: Internal Medicine

## 2018-09-23 NOTE — Telephone Encounter (Addendum)
Writer called patient Environmental health practitioner in regards to rescheduling an appointment with NP Egbert Garibaldi, Provider Personal    Writer left a Arts development officer for patient to call the office back     Writer canceled appointment    When patient calls the office back please warm transfer back to urgent line

## 2018-09-26 ENCOUNTER — Telehealth: Payer: Self-pay

## 2018-09-26 ENCOUNTER — Ambulatory Visit
Admission: RE | Admit: 2018-09-26 | Discharge: 2018-09-26 | Disposition: A | Discharge status: Home / Self Care | Payer: Medicare (Managed Care) | Source: Ambulatory Visit | Admitting: Physician Assistant

## 2018-09-26 ENCOUNTER — Other Ambulatory Visit: Payer: Self-pay | Admitting: Physician Assistant

## 2018-09-26 DIAGNOSIS — E119 Type 2 diabetes mellitus without complications: Secondary | ICD-10-CM

## 2018-09-26 NOTE — Telephone Encounter (Unsigned)
Copied from [place] [id number]. Topic: Appointments - Schedule Appointment  >> Sep 26, 2018  9:15 AM Huel Cote wrote:  Patient called regarding rescheduling telehome appointment that was missed today please contact patient at [phone]

## 2018-09-26 NOTE — Progress Notes (Signed)
Called and LVM via [place] interpreter [phone] for patient's telehome office visit. Patient was unable to be reached and telehome office visit was not completed. Patient needs to be scheduled for EGD/colonoscopy and can be scheduled 2 weeks after procedures.  Emeline General, [place]

## 2018-09-26 NOTE — Telephone Encounter (Signed)
Medication request routed to Dr. Hildred Alamin for processing 09/26/2018 1:28 PM

## 2018-09-28 ENCOUNTER — Telehealth: Payer: Self-pay

## 2018-09-28 NOTE — Telephone Encounter (Signed)
FYI

## 2018-09-28 NOTE — Telephone Encounter (Signed)
-----   Message from [place], [place] sent at 09/26/2018 12:30 PM EDT -----  Patient not reached. He can be rescheduled in 3-4 months. He also needs CEN scheduled. Thanks

## 2018-10-02 ENCOUNTER — Other Ambulatory Visit: Payer: Self-pay | Admitting: Internal Medicine

## 2018-10-02 DIAGNOSIS — E785 Hyperlipidemia, unspecified: Secondary | ICD-10-CM

## 2018-10-03 ENCOUNTER — Telehealth: Payer: Self-pay | Admitting: Physician Assistant

## 2018-10-03 NOTE — Telephone Encounter (Signed)
Called patient's wife back. Explained to her that the patient needs to have cen and then he can follow up afterwards with [facility] both in 3 to 4 months, and our procedure will contact them to schedule cen. Patient said she will schedule fuv at a later date.

## 2018-10-03 NOTE — Telephone Encounter (Signed)
Medication request routed to Dr. Hildred Alamin for processing 10/03/2018 9:56 AM

## 2018-10-03 NOTE — Telephone Encounter (Unsigned)
Copied from [place] [phone]. Topic: Appointments - Appointment Information  >> Oct 03, 2018  9:34 AM Sabriyah Wilcher, Larena Glassman wrote:  Patients wife [name] is calling to schedule her husband an appointment. Writer offered to reschedule telehome for 3-4 months and Carmen declined. [name] did not understand why appointment was that far out, and did not understand  6/15 FUV patient no showed. [name] can be reached at [phone]    Please see encounter from 6/17

## 2018-10-04 ENCOUNTER — Other Ambulatory Visit: Payer: Self-pay | Admitting: Internal Medicine

## 2018-10-04 DIAGNOSIS — Z794 Long term (current) use of insulin: Secondary | ICD-10-CM

## 2018-10-04 DIAGNOSIS — E1165 Type 2 diabetes mellitus with hyperglycemia: Secondary | ICD-10-CM

## 2018-10-04 NOTE — Telephone Encounter (Signed)
Medication request routed to Dr. Hildred Alamin for processing 10/04/2018 10:06 AM

## 2018-10-20 ENCOUNTER — Telehealth: Payer: Self-pay | Admitting: Internal Medicine

## 2018-10-20 DIAGNOSIS — E119 Type 2 diabetes mellitus without complications: Secondary | ICD-10-CM

## 2018-10-20 NOTE — Telephone Encounter (Signed)
Medication request routed to Dr. Hildred Alamin for processing 10/20/2018 9:22 AM    Pt is also requsting a script for a new blood glucose machine

## 2018-10-20 NOTE — Telephone Encounter (Signed)
Patient's wife is calling to request a script for a new blood glucose machine along with test strips and lancets.      Patient is using [place], [place]

## 2018-10-21 ENCOUNTER — Telehealth: Payer: Self-pay | Admitting: Internal Medicine

## 2018-10-21 DIAGNOSIS — E119 Type 2 diabetes mellitus without complications: Secondary | ICD-10-CM

## 2018-10-21 MED ORDER — ONETOUCH VERIO W/DEVICE KIT *A*
PACK | 0 refills | Status: DC
Start: 2018-10-21 — End: 2021-10-31

## 2018-10-25 NOTE — Telephone Encounter (Signed)
Copied from [place] [id number]. Topic: Medications/Prescriptions - Medication Question/Problem  >> Oct 25, 2018  3:33 PM Verda Cumins wrote:  [name] (wife) said she spoke with the pharmacy today and they said they don't have the script for patient even though our records show:  Sent to pharmacy as: blood glucose monitor w/Device KIT    E-Prescribing Status: Receipt confirmed by pharmacy (10/21/2018 9:33 PM EDT)    Carmen's number is [phone]

## 2018-10-25 NOTE — Telephone Encounter (Signed)
Routing to Dr Haley for advisement.

## 2018-10-27 ENCOUNTER — Ambulatory Visit: Payer: Medicare (Managed Care) | Admitting: Physician Assistant

## 2018-10-28 MED ORDER — BLOOD GLUCOSE MONITOR SYSTEM KIT *A*
PACK | 0 refills | Status: DC
Start: 2018-10-28 — End: 2021-10-31

## 2018-10-28 NOTE — Telephone Encounter (Signed)
Resent new script.    Lollie Sails MD

## 2018-10-28 NOTE — Telephone Encounter (Signed)
Tristan Mcbride is calling back in regards to blood glucose machine.      Writer contacted pharmacy and was advised that they received a script for blood pressure cuff.    Please resend script to [place]      Call was disconnected.

## 2018-10-28 NOTE — Telephone Encounter (Signed)
Medication request routed to Dr. Carlos Levering for processing 10/28/2018 1:10 PM

## 2018-11-02 ENCOUNTER — Other Ambulatory Visit
Admission: RE | Admit: 2018-11-02 | Discharge: 2018-11-02 | Disposition: A | Discharge status: Home / Self Care | Payer: Medicare (Managed Care) | Source: Ambulatory Visit | Attending: Physician Assistant | Admitting: Physician Assistant

## 2018-11-02 ENCOUNTER — Encounter: Payer: Self-pay | Admitting: Physician Assistant

## 2018-11-02 ENCOUNTER — Ambulatory Visit: Payer: Medicare (Managed Care) | Admitting: Physician Assistant

## 2018-11-02 VITALS — BP 122/60 | HR 60 | Temp 97.5°F | Ht 67.0 in | Wt 133.4 lb

## 2018-11-02 DIAGNOSIS — R972 Elevated prostate specific antigen [PSA]: Secondary | ICD-10-CM

## 2018-11-02 DIAGNOSIS — E119 Type 2 diabetes mellitus without complications: Secondary | ICD-10-CM

## 2018-11-02 DIAGNOSIS — R634 Abnormal weight loss: Secondary | ICD-10-CM

## 2018-11-02 DIAGNOSIS — H579 Unspecified disorder of eye and adnexa: Secondary | ICD-10-CM

## 2018-11-02 DIAGNOSIS — H11411 Vascular abnormalities of conjunctiva, right eye: Secondary | ICD-10-CM

## 2018-11-02 DIAGNOSIS — K5900 Constipation, unspecified: Secondary | ICD-10-CM

## 2018-11-02 LAB — BASIC METABOLIC PANEL
Anion Gap: 11 (ref 7–16)
CO2: 27 mmol/L (ref 20–28)
Calcium: 10.3 mg/dL — ABNORMAL HIGH (ref 8.6–10.2)
Chloride: 101 mmol/L (ref 96–108)
Creatinine: 1.38 mg/dL — ABNORMAL HIGH (ref 0.67–1.17)
GFR,Black: 59 * — AB
GFR,Caucasian: 51 * — AB
Glucose: 91 mg/dL (ref 60–99)
Lab: 28 mg/dL — ABNORMAL HIGH (ref 6–20)
Potassium: 4.5 mmol/L (ref 3.3–5.1)
Sodium: 139 mmol/L (ref 133–145)

## 2018-11-02 LAB — HEMOGLOBIN A1C: Hemoglobin A1C: 5.6 %

## 2018-11-02 LAB — LIPID PANEL
Chol/HDL Ratio: 2
Cholesterol: 125 mg/dL
HDL: 61 mg/dL — ABNORMAL HIGH (ref 40–60)
LDL Calculated: 57 mg/dL
Non HDL Cholesterol: 64 mg/dL
Triglycerides: 35 mg/dL

## 2018-11-02 LAB — TISSUE TRANSGLUT,IGA: tTG,IgA: <0.5 U/mL (ref 0.0–14.9)

## 2018-11-02 LAB — HIV 1&2 ANTIGEN/ANTIBODY: HIV 1&2 ANTIGEN/ANTIBODY: NONREACTIVE

## 2018-11-02 MED ORDER — SENNOSIDES 8.6 MG PO TABS *I*
2.0000 | ORAL_TABLET | Freq: Every day | ORAL | 1 refills | Status: DC
Start: 2018-11-02 — End: 2020-02-27

## 2018-11-02 MED ORDER — METAMUCIL SMOOTH TEXTURE 28.3 % PO POWD
ORAL | 3 refills | Status: AC
Start: 2018-11-02 — End: ?

## 2018-11-02 NOTE — Progress Notes (Signed)
[facility]- Progress Note      HPI:  Tristan Mcbride is 73 y.o. year old male who has a past medical history significant for type 2 diabetes, benign prostatic hypertrophy, GERD, hyperlipidemia, hypertension, kidney cyst, unintentional weight loss, elevated PSA.  Patient presents today for follow-up.  He is Spanish-speaking.  We are drawing today via life Spanish interpreter.    He is losing a lot of weight. He has had a slow weight loss. He reports constipation. Appetite is "ok." He did not yet eat breakfast. For dinner- rice, steak. Lunch- sandwich. Breakfast yesterday- sandwich, apple juice. Snacks- no snacks. Drinks- water.   He reports mother had pancreatic cancer. No colon cancer in family. Denies fam hx of breast cancer.   No fevers, chills, nausea, vomiting, dysphagia, blood in stool.   Did not have EGD or colonoscopy.     Has had elevated PSA, MRI ordered and scheduled.     He reports right eye that is injected since 2 weeks ago. Not painful. He has been using visine. He has not been itching eyes. He reports vision loss. No itching of eyes. 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 He does wear protective ear wear. Dust may have gotten in his eye. No photophobia. Clear drainage from eye.     Fasting sugars- 80-100. Denies hypoglycemic episodes.     Moving bowels every 3-4 days. Using maalox. No abdominal pain, nausea, vomiting.     Medications, allergies, and social history reviewed and updated with patient as appropriate.    Physical Exam:   Constitutional: Well developed male who is not in any acute distress. Spanish speaking   Head: normocephalic and atraumatic.  Eyes:right eye injected on medial aspect  Mouth: MMM oropharynx clear  Neck: supple, no lymphadenopathy.  Cardiovascular: RRR normal S1 and S2, no murmurs, no rubs, no gallops. No peripheral edema.   Pulmonary: CTA  Abdomen: soft, nontender, ND, normal active bowel sounds.   Skin: warm and  dry    Vitals:    11/02/18 0841   BP: 122/60   BP Location: Left arm   Patient Position: Sitting   Pulse: 60   Temp: 36.4 C (97.5 F)   TempSrc: Temporal   Weight: 60.5 kg (133 lb 6.4 oz)   Height: 1.702 m (5\' 7" )     Wt Readings from Last 3 Encounters:   11/02/18 60.5 kg (133 lb 6.4 oz)   06/17/18 62.9 kg (138 lb 11.2 oz)   06/01/18 64.6 kg (142 lb 6.4 oz)     BP Readings from Last 3 Encounters:   11/02/18 122/60   09/06/18 136/83   06/17/18 108/63       Vitals reviewed this visit.     RESULTS:     ASSESSMENT/PLAN:     Diagnoses and all orders for this visit:    T2DM (type 2 diabetes mellitus)  - a.1c 5.7, goal <7. Repeat today  - does not need to monitor BGs.   - continue metformin 2 tabs BID, if at goal, consider weaning to 500 mg BID   - continue atorvastatin 20 mg daily   - labs ordered  -     Basic metabolic panel; Future  -     Hemoglobin A1c; Future  -     Lipid Panel (Reflex to Direct  LDL if Triglycerides more than 400); Future    Elevated PSA  - without obstructive ot LUTS  ssx  - continue flomax 0.4 mg daily   - prostate bx in 2012 neg for cancer  - MRI scheduled     Unintentional weight loss- has had steady decline of weight loss, 5 lbs loss since march.  Seen by GI who recommended colonscopy.normal thyroid function. Urine without blood. Elevated PSA, see above. Slight increase in WBC and neutrophils. No anemia. No liver dysfunction. Stable cr. He has good appetite.   - HIV and celiac screen ordered  - GI recommended EGD and colonoscopy. Advised to call to reschedule these as cancelled due to covid 19    Injected eye, right  - declines urgent eval as needs to go to work. Advised risks of vision loss. 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 Urgent referral placed  -     AMB REFERRAL TO OPHTHALMOLOGY    Constipation- moving bowels every 3-4 days   - start senna (SENOKOT) 8.6 MG tablet; Take 2 tablets by mouth daily  - start psyllium (METAMUCIL SMOOTH TEXTURE) 28.3 % POWD powder; 1 tablespoon in 8 oz of  water daily by mouth.    Follow up: 3 months     Darcus Pester, [place]   [facility]  11/02/2018 at 10:57 AM      Patient instructions      Patient Instructions   GI appointment: [phone]

## 2018-11-02 NOTE — Patient Instructions (Signed)
GI appointment: [phone]

## 2018-11-03 ENCOUNTER — Other Ambulatory Visit: Payer: Self-pay | Admitting: Oncology

## 2018-11-03 ENCOUNTER — Telehealth: Payer: Self-pay

## 2018-11-03 NOTE — Telephone Encounter (Signed)
I spoke with the patient today expressed the urgency of him coming in today, he kept telling me he had to work.     He was offered tomorrow at [place], I gave him the address [place], and also the time 11:15 a.m.     I spoke with Almyra Free from [employer], gave the patient's "e" number to her and she said she would try and get an intp. She didn't have a reason to believe they wouldn't be able to find one. I gave her my EXT just in case she couldn't find one, and I also gave her ACD's number just in case she calls me after 4:15 p.m.

## 2018-11-04 ENCOUNTER — Encounter: Payer: Self-pay | Admitting: Optometry

## 2018-11-04 ENCOUNTER — Ambulatory Visit: Payer: Medicare (Managed Care) | Admitting: Optometry

## 2018-11-04 DIAGNOSIS — H2513 Age-related nuclear cataract, bilateral: Secondary | ICD-10-CM

## 2018-11-04 DIAGNOSIS — H40003 Preglaucoma, unspecified, bilateral: Secondary | ICD-10-CM

## 2018-11-04 DIAGNOSIS — H35373 Puckering of macula, bilateral: Secondary | ICD-10-CM

## 2018-11-04 DIAGNOSIS — H1131 Conjunctival hemorrhage, right eye: Secondary | ICD-10-CM

## 2018-11-04 DIAGNOSIS — H43813 Vitreous degeneration, bilateral: Secondary | ICD-10-CM

## 2018-11-04 DIAGNOSIS — H524 Presbyopia: Secondary | ICD-10-CM

## 2018-11-04 DIAGNOSIS — E119 Type 2 diabetes mellitus without complications: Secondary | ICD-10-CM

## 2018-11-04 DIAGNOSIS — H52203 Unspecified astigmatism, bilateral: Secondary | ICD-10-CM

## 2018-11-04 NOTE — Progress Notes (Signed)
Outpatient Visit      Patient name: Tristan Mcbride  DOB: [birth year]       Age: 73 y.o.  MR#: [id number]    Encounter Date: 11/04/2018    Subjective:      Chief Complaint   Patient presents with    Eye Problem     HPI     NPV: LEE: unsure but thinks its been a while     No CL/No DM  Hx: none   Gtts: none     Tristan Mcbride is a 73 year old male that is here for OD problem visit. Stated   he woke up about 2 weeks ago and OD was red, and watery. Currently he has   no eye pain but redness OD towards medial canthus. Claimed he was recently   diagnosed w diabetes and is wondering if this issue is related. Does not   wear any glasses or contacts. Patient reports the redness was like there   was blood in there but today it has improved and he only notices it   nasally. No issues with left eye    Patient is diabetic.  Type of Diabetes: II  Duration of diabetes: 1-35yr Most recent BG: does not check, patient reports was on insulin but was   discontinued after he lost weight  Last HbA1C: 5.7 (11/02/18)  Patient reports under control    Patient reports doesn't complain of floaters    Last edited by KCharyl Bigger OD on 11/04/2018  3:08 PM. (History)        has a current medication list which includes the following prescription(s): senna, metamucil smooth texture, blood glucose monitor system, blood glucose monitor, onetouch delica plus [id number] atorvastatin, metformin, onetouch verio, lisinopril, lancets 30g, tamsulosin, alcohol swabs, insulin glargine, and naproxen sodium.     is allergic to tetanus toxoids and no known latex allergy.      Past Medical History:   Diagnosis Date    BPH (benign prostatic hypertrophy)     DM (diabetes mellitus) 10/13/2017    Esophageal reflux     Fibrolipoma     intramuscular adipose tissue    Hyperlipidemia     Hypertension     Hypoglycemia     Kidney cyst     Rotator cuff tendinitis     Right    Sexual dysfunction     absent ejaculation    Type 2 diabetes mellitus     Weight loss        Past Surgical History:   Procedure Laterality Date    KNEE SURGERY      left    LEG TENDON SURGERY      Right        Specialty Problems        Ophthalmology Problems    DM (diabetes mellitus)               ROS     Positive for: Eyes    Negative for: Constitutional, Gastrointestinal, Neurological, Skin,   Genitourinary, Musculoskeletal, HENT, Endocrine, Cardiovascular,   Respiratory, Psychiatric, Allergic/Imm, Heme/Lymph    Last edited by MBlanche Easton 11/04/2018 11:27 AM. (History)         Objective:     Base Eye Exam     Visual Acuity (Snellen - Linear)       Right Left    Dist sc 20/40 20/60    Near sc J1 OU          Tonometry (  Tonopen, 11:56 AM)       Right Left    Pressure 15 19          Pupils       Pupils APD    Right PERRLA None    Left PERRLA None          Visual Fields       Left Right     Full Full          Extraocular Movement       Right Left     Full Full          Neuro/Psych     Oriented x3:  Yes    Mood/Affect:  Normal          Dilation     Both eyes:  2.5% Phenylephrine, 1.0% Tropicamide @ 11:57 AM            Slit Lamp and Fundus Exam     External Exam       Right Left    External Normal ocular adnexae, lacrimal gland & drainage, orbits Normal ocular adnexae, lacrimal gland & drainage, orbits          Slit Lamp Exam       Right Left    Lids/Lashes 1+ Meibomian gland dysfunction 1+ Meibomian gland dysfunction    Conjunctiva/Sclera  nasal subconjunctival hemorrhage, resolving (appears to have been inferior and temporal as well but has cleared up mostly) trace melanosis OU, temp pinguecula    Cornea Normal epithelium, stroma, endothelium, tear film Normal epithelium, stroma, endothelium, tear film    Anterior Chamber Clear & deep Clear & deep    Iris Normal shape, size, morphology Normal shape, size, morphology    Lens 2+ Nuclear sclerosis, few posterior vacoules 2+ Nuclear sclerosis, trace posterior vacoules    Vitreous Posterior vitreous detachment Posterior vitreous detachment          Fundus  Exam       Right Left    Disc Normal size, appearance, nerve fiber layer Normal size, appearance, nerve fiber layer    C/D Ratio .60V/.7H .65V/.7H    Macula Normal, trace ERM Normal, trace inf ERM    Vessels Normal (-) DR OU Normal    Periphery Normal, few peripheral floaters Normal, peripheral floaters            Refraction     Manifest Refraction       Sphere Cylinder Axis Dist VA Add Near [place]    Right -0.25 -0.25 163 20/30-2 +2.00     Left [place] -0.25 047 20/25-1 +2.00 J1+ OU          Final Rx       Sphere Cylinder Axis Dist VA Add [place]    Right -0.25 -0.25 163 20/30-2 +2.00     Left [place] -0.25 047 20/25-1 +2.00 J1+ OU    Type:  PAL    Expiration Date:  11/05/2019              Final Rx       Sphere Cylinder Axis Dist VA Add [place]    Right -0.25 -0.25 163 20/30-2 +2.00     Left [place] -0.25 047 20/25-1 +2.00 J1+ OU    Type:  PAL    Expiration Date:  11/05/2019                No annotated images are attached to the encounter.      Assessment/Plan:  1. Subconjunctival hemorrhage of right eye     2. Type 2 diabetes mellitus without retinopathy     3. Glaucoma suspect of both eyes  OCT, RNFL-OU    OCT, RNFL-OU   4. Epiretinal membrane (ERM) of both eyes  OCT, mac-OU    OCT, mac-OU   5. Nuclear sclerosis of both eyes     6. Vitreous degeneration of both eyes     7. Astigmatism of both eyes with presbyopia          PLAN:    1. Patient educated on findings today of subconjunctival hemorrhage of the eye. Educated this can occur from trauma/straining/valsalva or sometimes occurs spontaneously especially in patients with diabetes/hypertension. Due to the extent of the hemorrhage, recommended dilation to rule out additional bleeding and the dilated examination today showed no signs of bleeding. Educated this should eventually heal on it's own in the next few weeks. For comfort and to help along the process can consider cool compresses as needed. In addition, Patient educated can also use artificial tears such as  refresh, systane or soothe XP in both eyes as needed. Educated to call or return if new symptoms arise, or in a few weeks it is not resolving as anticipated. Follow up as directed by primary care to monitor systemic health.     2. The patient is without signs of diabetic retinopathy or diabetic macular edema.  The patient was counseled to report any visual changes, to regularly check serum glucose levels as directed, to optimize diet, to take prescribed medications, to keep scheduled appointments, and to optimize blood glucose control with HgA1C less than 7 or as primary physician or endocrinologist has specified.  Communication will be forwarded to the responsible physician managing the patient's diabetes.  Follow up has been arranged for 1 year.    3. Glaucoma suspect vs. Physiologic cupping OU, ?progression compared to 2008 photos and 2019 photos and appearance today, although rim tissue appears healthy.  -     IOP:  Normal IOP, 15/19 (slightly higher OS, still wnl)  -     OCT:  Within normal limits, without signs of thinning, good avg rnfl, no previous to compare to but last HVF from 2012 appeared normal  Based on current data, will monitor for now, but closely  - Return for IOP check in 6 mos with pachymetry    4. Patient was educated on findings of trace epiretinal membrane in both eyes, which is confirmed my macular OCT. Visual impact is not bothersome to patient, therefore surgical intervention is not warranted at this time. Discussed importance of monitoring central vision for changes and returning stat if notices changes in vision/distortion, otherwise monitor in 1 year with dilated examination.    5. Patient ed on cataracts, educated appears slightly visually significant, but surgical extraction not warranted at this time, monitor annually    6. Patient was educated that they have vitreous floaters consistent with a posterior vitreous detachment in both eyes. There were no retinal holes or tears seen on  dilated examination. Signs and symptoms of retinal detachment were reviewed (flashes, floaters, loss of vision, curtain, etc.) and the patient will call for any changes. Unless changes noted, will monitor annually or sooner as needed.    7. Spectacle Rx finalized and given to patient today     RTC  6 months for iop check/pachy or sooner as needed

## 2018-11-04 NOTE — Patient Instructions (Signed)
Patient educated on findings today of subconjunctival hemorrhage of the eye. Educated this can occur from trauma/straining/valsalva or sometimes occurs spontaneously especially in patients with diabetes/hypertension. Due to the extent of the hemorrhage, recommended dilation to rule out additional bleeding and the  dilated examination today was normal. Educated this should eventually heal on it's own in the next few weeks. For comfort and to help along the process can consider cool compresses as needed. In addition, Patient educated can also use artificial tears such as refresh, systane or soothe XP in both eyes as needed. Educated to call or return if new symptoms arise, or in a few weeks it is not resolving as anticipated. Follow up as directed by primary care to monitor systemic health.     If you experience flashing lights (like lightning bolts that last for only a second), new/worsening floating spots (like cobwebs that move/float as you move your eyes) a curtain coming down on your vision, or complete loss of vision, these could be signs/symptoms of a retinal detachment therefore it is important to call and schedule an appointment ASAP    Call or return to clinic as needed if notices changes in vision, loss of vision, new ocular symptoms or worsening of symptoms.

## 2018-11-04 NOTE — Telephone Encounter (Signed)
Attempted to reach patient to schedule CEN, no answer, left vcml requesting call back. Unable to contact letter sent.

## 2018-11-06 ENCOUNTER — Other Ambulatory Visit: Payer: Self-pay | Admitting: Physician Assistant

## 2018-11-06 DIAGNOSIS — Z794 Long term (current) use of insulin: Secondary | ICD-10-CM

## 2018-11-06 DIAGNOSIS — E1165 Type 2 diabetes mellitus with hyperglycemia: Secondary | ICD-10-CM

## 2018-11-06 MED ORDER — METFORMIN HCL 500 MG PO TABS *I*
500.0000 mg | ORAL_TABLET | Freq: Two times a day (BID) | ORAL | 1 refills | Status: DC
Start: 2018-11-06 — End: 2019-01-18

## 2018-11-07 ENCOUNTER — Telehealth: Payer: Self-pay

## 2018-11-07 NOTE — Telephone Encounter (Signed)
-----   Message from Darcus Pester, [place] sent at 11/06/2018 11:02 PM EDT -----  Please call. Diabetes well controlled. May reduce metformin to 500 mg (1 tab) BID. Celiac screen neg. No hiv. Cholesterol well controlled. thanks

## 2018-11-07 NOTE — Telephone Encounter (Signed)
Called and spoke to pt's wife with [employer] [phone].    Pt unavailable.  Writer requested pt return call at a later time (d/t sensitive results HIV testing).    Pt's wife will have pt call at a later time;  Callback number provided.    Writer will await pt's call.

## 2018-11-08 NOTE — Telephone Encounter (Signed)
Called and spoke to pt's wife Tristan Mcbride.    Pt unavailable. Writer was told that pt normally available around noon, as pt returns home for lunch.    Pt's wife will have pt call at a later time, SIM office number provided

## 2018-11-09 NOTE — Telephone Encounter (Signed)
Copied from [place] [phone]. Topic: Return Call - Speak to Provider/Office Staff  >> Nov 09, 2018 12:36 PM Lula Olszewski wrote:  Tristan Mcbride is returning Antonietta Barcelona call regarding patient's lab results and medication. Please call Tristan Mcbride or patient at [phone]

## 2018-11-09 NOTE — Telephone Encounter (Signed)
Attempted to contact pt at noon, per pt's wife request  No answer  Writer left VM requesting return call  Call back number provided

## 2018-11-10 ENCOUNTER — Encounter: Payer: Self-pay | Admitting: Internal Medicine

## 2018-11-10 NOTE — Telephone Encounter (Signed)
Copied from [place] [phone]. Topic: Return Call - Speak to Provider/Office Staff  >> Nov 10, 2018 10:04 AM Agnes Lawrence wrote:  The patient is requesting to speak with a nurse. Patient's wife stated that this is regarding medication changes.    Caller stated that the patient is only available for a few minutes to an hour at specific times a day. Patient will be able to speak with office at 12 today.    Please call the patient back at [phone] to discuss.

## 2018-11-10 NOTE — Telephone Encounter (Signed)
Writer was unable to call at noon as requested; call to patient via [employer] Spanish interpretor.   No answer, left message to call office. Call back number provided.

## 2018-11-10 NOTE — Telephone Encounter (Signed)
A user error has taken place: encounter opened in error, closed for administrative reasons.

## 2018-11-11 NOTE — Telephone Encounter (Signed)
Called and spoke to pt    Writer confirmed 2 patient identifiers (last name and DOB)    Writer relayed results and instructions below from [place], [place].    Pt verbalized understanding and agreeable to take 1 tab metformin BID.

## 2018-11-11 NOTE — Telephone Encounter (Signed)
Mr. Graves is calling to schedule an appointment with GI. The patient would like to be seen for CEN. Please call the patient to schedule at [phone] - [place].

## 2018-11-15 NOTE — Telephone Encounter (Signed)
Mr. Madlock is calling to schedule an CEN appt. Please call the patient to schedule at [phone].

## 2018-11-24 ENCOUNTER — Other Ambulatory Visit: Payer: Medicare (Managed Care)

## 2018-11-24 ENCOUNTER — Ambulatory Visit
Admission: RE | Admit: 2018-11-24 | Discharge: 2018-11-24 | Disposition: A | Discharge status: Home / Self Care | Payer: Medicare (Managed Care) | Source: Ambulatory Visit

## 2018-11-24 DIAGNOSIS — N429 Disorder of prostate, unspecified: Secondary | ICD-10-CM

## 2018-11-24 DIAGNOSIS — R972 Elevated prostate specific antigen [PSA]: Secondary | ICD-10-CM

## 2018-11-24 MED ORDER — GADOTERIDOL 279.3 MG/ML (PROHANCE) IV SOLN *I*
1.0000 mL | Freq: Once | INTRAVENOUS | Status: AC
Start: 2018-11-24 — End: 2018-11-24
  Administered 2018-11-24: 20 mL via INTRAVENOUS

## 2018-11-25 ENCOUNTER — Telehealth: Payer: Self-pay | Admitting: Student in an Organized Health Care Education/Training Program

## 2018-11-29 NOTE — Patient Instructions (Signed)
ULTRASOUND GUIDED BIOPSY OF THE PROSTATE PATIENT INSTRUCTIONS      Before your biopsy:  · DO NOT take aspirin for one week prior to your biopsy.  · DO NOT take any blood thinners for one week prior to your biopsy.  · Continue to take all of your usual daily medicines.  · You may eat before this procedure and we encourage you too!  · One hour prior to leaving the house administer a Fleet's Enema - see below.  This is a non-prescription and can be obtained at the drug store.    On the day of the biopsy, you will be given an oral and intramuscular (injected) antibiotic.  You do no need additional antibiotics before or after the biopsy.    PLEASE DO NOT SKIP BREAKFAST OR LUNCH! - PLEASE EAT! AT LEAST A LIGHT MEAL.  We do not want you to pass out due to not eating.    What to expect during your biopsy:  · You will be brought to an exam room to remove clothing below your waist.  · You will lie on your left side and a probe will be gently placed into your rectum to study the prostate and guide the needle biopsy through the rectum.    After your biopsy:  · You may have some discomfort following the biopsy.  · Take Tylenol for the discomfort. (NO ASPRIN)    Contact your Doctor if you develop the following:  · Excessive pain  Prolonged excessive bleeding or temperature greater than 101 degrees Fahrenheit

## 2018-12-13 NOTE — Telephone Encounter (Signed)
Called pt no ans/no machine ...mailed letter re: CEN

## 2018-12-21 ENCOUNTER — Ambulatory Visit
Payer: Medicare (Managed Care) | Attending: Urology | Admitting: Student in an Organized Health Care Education/Training Program

## 2018-12-21 ENCOUNTER — Telehealth: Payer: Self-pay | Admitting: Student in an Organized Health Care Education/Training Program

## 2018-12-21 ENCOUNTER — Encounter: Payer: Self-pay | Admitting: Urology

## 2018-12-21 VITALS — Ht 67.0 in | Wt 138.0 lb

## 2018-12-21 DIAGNOSIS — R972 Elevated prostate specific antigen [PSA]: Secondary | ICD-10-CM | POA: Insufficient documentation

## 2018-12-21 NOTE — Telephone Encounter (Signed)
Lysa,  Please schedule the patient for a [place] with Dr. Rosalyn Gess. Ida Rogue next Tuesday, 12/27/2018 at 1300. He will need an Administrator, sports. Please make sure he has BX instructions. Thank you.  Andee Poles

## 2018-12-21 NOTE — Procedures (Signed)
Procedure not performed today. For the first 15 minutes of the visit the [employer] interpretation services were used. The number of the interpreter used was [id number] Velva Harman). For the remainder of the visit interpreter from [facility],  was used. Patient was sent home with the proper instructions for a prostate biopsy in spanish. Patient will be rescheduled.

## 2018-12-21 NOTE — Patient Instructions (Addendum)
Biopsia de prstata   CUIDADO AMBULATORIO:   Lo que usted necesita saber acerca de la biopsia de prstata: Una biopsia de prstata es un procedimiento para extraer muestras de tejido de su prstata. La prstata es la glndula sexual masculina que produce el lquido que se encuentra en el semen. Se localiza justo debajo de la vejiga. Despus de que se toman las [place], se mandan al laboratorio para examinarla para cncer.      Cmo prepararse para una biopsia de prstata:   Su mdico hablar con usted sobre cmo prepararse para este procedimiento. Usted Aeronautical engineer de tomar anticoagulantes varios das antes de su procedimiento. Ejemplos de anticoagulantes son la warfarina y los AINEs. Es posible que tambin necesite dejar de tomar suplementos herbales antes de su procedimiento. Es posible que le indique que se duche la noche antes de la [place]. Puede que le indique que use un determinado jabn para ayudar a prevenir una infeccin en el rea [place]. Su mdico podra decirle que no coma ni beba nada despus de la medianoche del da de la [place]. Le dir qu otros medicamentos puede tomar o no Games developer del procedimiento.     A usted le darn antibiticos para ayudar a Product/process development scientist una infeccin bacteriana. Es posible que deba aplicarse un (medicamento lquido que se Optometrist) para vaciar sus intestinos antes de su procedimiento.    Qu suceder durante una biopsia de prstata:   Es posible que usted tenga que acostarse de lado con las rodillas levantadas hacia su pecho. Podran aplicarle crema o gel para adormecer dentro de su recto, o podran inyectarle medicamento anestsico cerca de su prstata. Es posible que en lugar de eso le administren anestesia general para mantenerlo dormido y sin dolor durante el procedimiento. Podran tomarle Truddie Coco de biopsia a travs del recto, la uretra o del perineo. El perineo es el rea entre el escroto y Designer, television/film set. La mayor parte del [place], las [employer] de  biopsia se toman a travs del recto.     Si su mdico toma Truddie Coco a travs del recto, introducir una sonda pequea de ultrasonido dentro del mismo. El ultrasonido muestra imgenes de su prstata en un monitor y se [place] para guiar la aguja de la biopsia. Su mdico empujar la aguja de la biopsia a travs de la pared de su recto y Water engineer dentro de la glndula prosttica. Su mdico extraer [employer] 6 a 12 muestras de tejido de Youth worker reas de su glndula prosttica. Las muestras sern mandadas al laboratorio y examinadas para cncer.    Qu suceder despus de una biopsia de prstata: Usted podra sentir dolor en el rea de la biopsia. Es posible que usted necesite tomar antibiticos hasta por 2 das despus de su procedimiento para ayudar a Education officer, environmental infeccin. Es posible que usted tenga sangrado en su recto. Tambin podra tener sangre en su orina, en sus evacuaciones intestinales o en su semen.  Riesgos de una biopsia de prstata: Usted podra sangrar ms de lo esperado o contraer una infeccin en el tracto urinario o en la glndula prosttica. La infeccin se podra propagar a su sangre y al resto de su cuerpo. Es posible que su vejiga no se vace completamente cuando orine. Usted podra necesitar un catter para ayudar a vaciar su vejiga por un plazo corto de [place]. Es posible que no se detecten las clulas cancerosas durante su procedimiento de biopsia. Usted podra necesitar que le realicen otra biopsia de prstata para  determinar si tiene cncer.  Busque atencin mdica de inmediato si:   Usted tiene sangrado abundante saliendo de su recto.     Orina muy poco o no [place].     Usted tiene dolor debido a su procedimiento que empeora aun despus de tomar medicamento para Conservation officer, historic buildings.    Comunquese con su mdico si:   Usted tiene fiebre o escalofros.     Usted siente dolor o ardor al Garment/textile technologist.     Su orina est turbia o tiene mal olor.     Usted tiene preguntas o inquietudes acerca de su condicin  o cuidado.    Medicamentos:   Los medicamentos pueden ayudar a Best boy. Usted podra necesitar medicamento para Public house manager o tratar una infeccin bacteriana. Pregunte cmo se debe tomar los analgsicos de forma segura.     Tome sus medicamentos como se le haya indicado. Consulte con su mdico si usted cree que su medicamento no le est ayudando o si presenta efectos secundarios. Infrmele si es alrgico a Environmental manager. Mantenga una lista actualizada de los [place], las vitaminas y los productos herbales que toma. Incluya los siguientes datos de los medicamentos: cantidad, frecuencia y motivo de Chief Financial Officer. Traiga con usted la lista o los envases de las pldoras a sus citas de seguimiento. Lleve la lista de los medicamentos con usted en caso de Engineer, maintenance (IT).    Programe una cita con su mdico o urlogo como se le indique: Es posible que usted necesite volver para que le realicen ms exmenes o procedimientos. Anote sus preguntas para que se acuerde de hacerlas durante sus visitas.   Copyright [employer] Information is for [employer] use only and may not be sold, redistributed or otherwise used for commercial purposes. All illustrations and images included in CareNotes are the copyrighted property of A.D.A.M., Inc. or [place] informacin es slo para uso en educacin. Su intencin no es darle un consejo mdico sobre enfermedades o tratamientos. Colsulte con su mdico, enfermera o farmacutico antes de seguir cualquier rgimen mdico para saber si es seguro y efectivo para usted.

## 2018-12-23 NOTE — Telephone Encounter (Signed)
Thank you for catching that [place]. Appointment type and room updated.

## 2018-12-23 NOTE — Telephone Encounter (Signed)
Lysa,  As per below message, patient was to be scheduled as a [place] with a Administrator, sports. Patient is currently scheduled as a [facility]. Please change to a [place] as someone could mistakenly put a 2 nd Uronav in at that time. Thank you.  Andee Poles

## 2018-12-23 NOTE — Telephone Encounter (Signed)
Spoke with patient and he confirmed date, time and location. Will stop Asprin and will do the enema about an hour and a half before

## 2018-12-24 NOTE — Progress Notes (Signed)
The patient was seen today and the visit was conducted with the assistance of the Spanish language interpreter.  The patient was unaware that he was having an ultrasound/MRI fusion prostate biopsy completed today.  An extensive discussion was completed by Dr. Alphonzo Severance and myself regarding preparation for the procedure in addition to the logic behind completing the procedure which is being done for further evaluation of an elevated PSA.  All the patient's questions were answered to his satisfaction and he was rescheduled for the prostate biopsy within the next several weeks.    Visit time greater than 20 minutes consisting of counseling and coordination of care

## 2018-12-27 ENCOUNTER — Encounter: Payer: Medicare (Managed Care) | Admitting: Urology

## 2018-12-27 ENCOUNTER — Ambulatory Visit
Payer: Medicare (Managed Care) | Attending: Urology | Admitting: Student in an Organized Health Care Education/Training Program

## 2018-12-27 ENCOUNTER — Encounter: Payer: Self-pay | Admitting: Urology

## 2018-12-27 VITALS — BP 146/89 | Ht 67.0 in | Wt 138.0 lb

## 2018-12-27 DIAGNOSIS — R972 Elevated prostate specific antigen [PSA]: Secondary | ICD-10-CM | POA: Insufficient documentation

## 2018-12-27 DIAGNOSIS — D291 Benign neoplasm of prostate: Secondary | ICD-10-CM

## 2018-12-27 DIAGNOSIS — N4231 Prostatic intraepithelial neoplasia: Secondary | ICD-10-CM

## 2018-12-27 LAB — POCT URINALYSIS DIPSTICK
Blood,UA POCT: NEGATIVE
Glucose,UA POCT: NORMAL mg/dL
Ketones,UA POCT: NEGATIVE mg/dL
Leuk Esterase,UA POCT: NEGATIVE
Lot #: 43799603
Nitrite,UA POCT: NEGATIVE
PH,UA POCT: 5 (ref 5–8)
Protein,UA POCT: NEGATIVE mg/dL
Specific gravity,UA POCT: 1.02 (ref 1.002–1.030)

## 2018-12-27 MED ORDER — IBUPROFEN 400 MG PO TABS *I*
400.0000 mg | ORAL_TABLET | Freq: Once | ORAL | Status: AC
Start: 2018-12-27 — End: 2018-12-27
  Administered 2018-12-27: 400 mg via ORAL

## 2018-12-27 MED ORDER — CEFTRIAXONE SODIUM 1 GM IV/IJ SOLR *WRAPPED*
1.0000 g | Freq: Once | INTRAMUSCULAR | Status: AC
Start: 2018-12-27 — End: 2018-12-27
  Administered 2018-12-27: 1 g via INTRAMUSCULAR

## 2018-12-27 MED ORDER — CIPROFLOXACIN HCL 500 MG PO TABS *I*
500.0000 mg | ORAL_TABLET | Freq: Once | ORAL | Status: AC
Start: 2018-12-27 — End: 2018-12-27
  Administered 2018-12-27: 500 mg via ORAL

## 2018-12-27 NOTE — Patient Instructions (Signed)
PLEASE READ CAREFULLY After your biopsy    1. The biopsy needle reaches the prostate through the rectum. The needle can enter the urethra as well (through tube in penis).     2. You may experience some blood in your stool and/or urine for up to one week. You may also see blood in semen which can last up to one month.     3. Avoid strenuous physical activity for 3-5 days. You may perform all other usual activities.     4. If you experience pain or discomfort, take two (2) Tylenol (acetaminophen) every 4-6 hours. DO NOT take aspirin as this may accentuate bleeding.     5. If bleeding occurs, drink plenty of fluids and rest.     Contact your Doctor if you develop the following:  · Excessive pain  · Prolonged excessive bleeding or temperature greater then 101 degrees fahrenheit  · Notify doctor if you experience fever, chills, cloudy or odorous urine, persistent burning or pain develops.   · Notify doctor if you have large amounts of rectal/urinary bleeding.   · If you are unable to urinate please return to the office by 4:00pm.  After 4:00pm contact your provider or go to a [facility] or an emergency room.  This could be due to a blood clot or swelling of the prostate.    [phone]

## 2018-12-27 NOTE — Procedures (Signed)
Spanish interpreter used: [phone] until in person interpreter showed up.    12/27/2018     Surgeon - Dr. Lorenso Quarry     Medication list was reviewed with the patient today.    Patient was given Ceftriaxone 1 gram, Cipro 500 mg 1 tablet prior to today's procedure-   Patient took Enema - yes  Patient is not currently on Blood thinners    Mr. Haasch was seen in our Urology office today and an outpatient office transrectal ultrasound and prostate needle biopsy was performed under ultrasound guidance to rule out prostatic malignancy.      The provider took prostate biopsy specimens from the right base, mid and apex of the prostate, as well as the left base, mid and apex of the prostate.  A total number of 20 biopsy specimens were obtained.       Images were printed and stored in the patients electronic medical record.      The patient tolerated today's procedure well.

## 2018-12-27 NOTE — Progress Notes (Signed)
[facility] Biopsy Procedure Note    Indication for procedure: Elevated PSA with 2 PIRADs 3 lesion    Specimens: 2 targeted lesion biopsies and standard sextan biopsies    Complications: None    Description of Procedure:    After informed consent was obtained the patient was brought to the procedure room. DRE was performed which found: no nodules. The probe was then easily inserted per his rectum. Prostate measurements showed a volume of about 95 cc's. His PSA density was 0.11. 10 cc's of lidocaine was then injected into the seminal vesical/prostatic junction on each side of the prostate.     The prostate was scanned by slowly withdrawing the probe from the base of the prostate to the apex and then outlined with the [place].     The 2 lesions identified by MRI were then targeted and 4 biopsies were taken of each lesion.     Next 12 prostate biopsies were obtained by obtaining medial and lateral biopsies of the base, mid and apex of the gland of the left and right side of the gland.     All path specimens were sent for formal pathology.    The patient tolerated the procedure well.    Cheron Every, MD

## 2019-01-03 ENCOUNTER — Other Ambulatory Visit: Payer: Medicare (Managed Care) | Admitting: Student in an Organized Health Care Education/Training Program

## 2019-01-03 DIAGNOSIS — Z0189 Encounter for other specified special examinations: Secondary | ICD-10-CM

## 2019-01-03 LAB — SURGICAL PATHOLOGY

## 2019-01-04 ENCOUNTER — Ambulatory Visit: Payer: Medicare (Managed Care) | Admitting: Student in an Organized Health Care Education/Training Program

## 2019-01-04 VITALS — BP 144/80 | HR 73 | Ht 67.0 in | Wt 138.0 lb

## 2019-01-04 DIAGNOSIS — Z0189 Encounter for other specified special examinations: Secondary | ICD-10-CM

## 2019-01-04 DIAGNOSIS — N400 Enlarged prostate without lower urinary tract symptoms: Secondary | ICD-10-CM

## 2019-01-04 DIAGNOSIS — R972 Elevated prostate specific antigen [PSA]: Secondary | ICD-10-CM

## 2019-01-04 LAB — POCT URINALYSIS DIPSTICK
Blood,UA POCT: NEGATIVE
Glucose,UA POCT: NORMAL mg/dL
Ketones,UA POCT: NEGATIVE mg/dL
Leuk Esterase,UA POCT: NEGATIVE
Lot #: 43799603
Nitrite,UA POCT: NEGATIVE
PH,UA POCT: 5 (ref 5–8)
Protein,UA POCT: NEGATIVE mg/dL
Specific gravity,UA POCT: 1.015 (ref 1.002–1.030)

## 2019-01-05 NOTE — Assessment & Plan Note (Signed)
-   PSA has increased from 7.91 in 2015 to 10.17 in February 2020  - He previously underwent a biopsy in 2012 that was negative for cancer  - He would be interested in prostate cancer treatment if cancer was found and he appears to be healthy  - MRI was performed and showed 2 PI-RADS 3 lesions.  -He underwent a uro-nab biopsy in September 2020.  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  -Follow-up in 6 months with PSA prior to visit.

## 2019-01-05 NOTE — Progress Notes (Signed)
Urology Outpatient Note    Chief complaint: Elevated PSA    History of present illness:    The visit occurred with a Spanish interpreter present.    Tristan Mcbride has a history of an elevated PSA which he is following with urology for.  He underwent a Uronav biopsy on December 27, 2018.  At the left base he did have high-grade PIN, however all other biopsy sites just showed BPH.  This is obviously very good news for the patient and he and his wife were relieved.    He is not having any trouble urinating at this time.  He is not currently having any hematuria or hematochezia after the biopsy.    We discussed repeating a PSA and follow-up in 6 months.  Patient and wife agreed with the plan    REVIEW of SYSTEMS    Review of Systems   Constitutional: Negative.    HENT: Negative.    Eyes: Negative.    Respiratory: Negative.    Cardiovascular: Negative.    Gastrointestinal: Negative.    Genitourinary:        Per HPI   Musculoskeletal: Negative.    Skin: Negative.    Neurological: Negative.    Endo/Heme/Allergies: Negative.    Psychiatric/Behavioral: Negative.          Vital signs: Blood pressure 144/80, pulse 73, height 1.702 m (5' 7" ), weight 62.6 kg (138 lb).    Physical examination:  General : Normal  Neurologic: Oriented to person,place, time  Psychiatric : Normal mood and affect  Skin : Normal color, turgor, texture, hydration  Neck : Supple  Respiratory : normal respiratory effort. Normal work of breathing.   Uro: no Foley  Ext: no edema  Abd: soft    Medications:   Current Outpatient Medications   Medication    metFORMIN (GLUCOPHAGE) 500 MG tablet    senna (SENOKOT) 8.6 MG tablet    psyllium (METAMUCIL SMOOTH TEXTURE) 28.3 % POWD powder    blood glucose monitor system    blood glucose monitor w/Device KIT    atorvastatin (LIPITOR) 20 MG tablet    blood glucose (ONETOUCH VERIO) test strip    lisinopril (PRINIVIL,ZESTRIL) 20 MG tablet    Lancets 30G MISC    tamsulosin (FLOMAX) 0.4 MG capsule    alcohol swabs  pads    naproxen sodium (ANAPROX) 220 MG tablet     No current facility-administered medications for this visit.        Allergies:   Allergies   Allergen Reactions    Tetanus Toxoids      On Amb Int Med note from 08/20/09    No Known Latex Allergy        Past medical history:   Past Medical History:   Diagnosis Date    BPH (benign prostatic hypertrophy)     DM (diabetes mellitus) 10/13/2017    Esophageal reflux     Fibrolipoma     intramuscular adipose tissue    Hyperlipidemia     Hypertension     Hypoglycemia     Kidney cyst     Rotator cuff tendinitis     Right    Sexual dysfunction     absent ejaculation    Type 2 diabetes mellitus     Weight loss        Past surgical history:   Past Surgical History:   Procedure Laterality Date    KNEE SURGERY      left    LEG  TENDON SURGERY      Right       Family history:   Family History   Problem Relation Age of Onset    Heart Disease Mother     Diabetes Mother     Diabetes Father     Stroke Father     Cerebral Palsy Father     Breast cancer Maternal Aunt     Colon cancer Neg Hx     Esophageal cancer Neg Hx     Liver cancer Neg Hx     Pancreatic Cancer Neg Hx     Rectal cancer Neg Hx     Stomach cancer Neg Hx        Social History:   Social History     Socioeconomic History    Marital status: Married     Spouse name: Not on file    Number of children: Not on file    Years of education: Not on file    Highest education level: Not on file   Occupational History    Not on file   Tobacco Use    Smoking status: Never Smoker    Smokeless tobacco: Never Used   Substance and Sexual Activity    Alcohol use: No    Drug use: No    Sexual activity: Yes     Partners: Female   Social History Narrative    Not on file         Urine analysis shows : No results found for this or any previous visit (from the past 24 hour(s)).      Radiologic evaluation included: none    Assessment/Plan:    This is a 73 y.o. male who is presenting today for the following:      Elevated PSA  - PSA has increased from 7.91 in 2015 to 10.17 in February 2020  - He previously underwent a biopsy in 2012 that was negative for cancer  - He would be interested in prostate cancer treatment if cancer was found and he appears to be healthy  - MRI was performed and showed 2 PI-RADS 3 lesions.  -He underwent a uro-nab biopsy in September 2020.  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  -Follow-up in 6 months with PSA prior to visit.         Cheron Every, MD

## 2019-01-18 ENCOUNTER — Ambulatory Visit: Payer: Medicare (Managed Care) | Attending: Internal Medicine | Admitting: Internal Medicine

## 2019-01-18 ENCOUNTER — Encounter: Payer: Self-pay | Admitting: Internal Medicine

## 2019-01-18 VITALS — BP 124/70 | HR 72 | Temp 97.5°F | Ht 67.0 in | Wt 135.4 lb

## 2019-01-18 DIAGNOSIS — Z794 Long term (current) use of insulin: Secondary | ICD-10-CM | POA: Insufficient documentation

## 2019-01-18 DIAGNOSIS — Z23 Encounter for immunization: Secondary | ICD-10-CM | POA: Insufficient documentation

## 2019-01-18 DIAGNOSIS — E1165 Type 2 diabetes mellitus with hyperglycemia: Secondary | ICD-10-CM | POA: Insufficient documentation

## 2019-01-18 DIAGNOSIS — E119 Type 2 diabetes mellitus without complications: Secondary | ICD-10-CM | POA: Insufficient documentation

## 2019-01-18 DIAGNOSIS — S91209A Unspecified open wound of unspecified toe(s) with damage to nail, initial encounter: Secondary | ICD-10-CM

## 2019-01-18 DIAGNOSIS — Z0000 Encounter for general adult medical examination without abnormal findings: Secondary | ICD-10-CM | POA: Insufficient documentation

## 2019-01-18 LAB — COMPREHENSIVE METABOLIC PANEL, PL
ALT, PL: 15 U/L (ref 0–50)
AST, PL: 17 U/L (ref 0–50)
Albumin, PL: 3.9 g/dL (ref 3.5–5.2)
Alk Phos, PL: 96 U/L (ref 40–130)
Anion Gap,PL: 11 (ref 7–16)
Bilirubin Total, PL: 0.2 mg/dL (ref 0.0–1.2)
CO2,Plasma: 26 mmol/L (ref 20–28)
Calcium, PL: 10.2 mg/dL (ref 8.6–10.2)
Chloride,Plasma: 102 mmol/L (ref 96–108)
Creatinine: 1.2 mg/dL — ABNORMAL HIGH (ref 0.67–1.17)
GFR,Black: 70 *
GFR,Caucasian: 60 *
Glucose,Plasma: 85 mg/dL (ref 60–99)
Potassium,Plasma: 4.1 mmol/L (ref 3.4–4.7)
Sodium,Plasma: 139 mmol/L (ref 133–145)
Total Protein, PL: 7.2 g/dL (ref 6.3–7.7)
UN,Plasma: 27 mg/dL — ABNORMAL HIGH (ref 6–20)

## 2019-01-18 LAB — CBC
Hematocrit: 41 % (ref 40–51)
Hemoglobin: 13.1 g/dL — ABNORMAL LOW (ref 13.7–17.5)
MCH: 28 pg/cell (ref 26–32)
MCHC: 32 g/dL (ref 32–37)
MCV: 88 fL (ref 79–92)
Platelets: 265 10*3/uL (ref 150–330)
RBC: 4.7 MIL/uL (ref 4.6–6.1)
RDW: 14.2 % (ref 11.6–14.4)
WBC: 11.5 10*3/uL — ABNORMAL HIGH (ref 4.2–9.1)

## 2019-01-18 MED ORDER — METFORMIN HCL 500 MG PO TABS *I*
1000.0000 mg | ORAL_TABLET | Freq: Two times a day (BID) | ORAL | 0 refills | Status: DC
Start: 2019-01-18 — End: 2019-01-18

## 2019-01-18 MED ORDER — METFORMIN HCL 500 MG PO TABS *I*
500.0000 mg | ORAL_TABLET | Freq: Two times a day (BID) | ORAL | 0 refills | Status: DC
Start: 2019-01-18 — End: 2019-11-30

## 2019-01-18 NOTE — Patient Instructions (Signed)
Flu shot and shingles shot up front    Go to urgent care for COVID swab before your trip to [place] [employer]

## 2019-01-18 NOTE — Progress Notes (Signed)
[facility] Progress Note  Reason For Visit: No chief complaint on file.    Subjective   Tristan Mcbride is 73 y.o. man with medical history of T2DM, BPH, HTN, HLD presenting for routine follow-up  Subjective   Broken nail, great toe on right  -Dropped a spaghetti cannot accidentally, happened last week, thinks his nails about to fall off  -Has been wrapping it himself with gauze/tape  -Denies any redness or pus around the total    Weight  -Lost some weight while ago, now stable  -Denies any change in appetite  -Denies change in bowel habit    Misc  - med rec completed - taking his medication as Rx  -Urine flow good with Flomax    ROS    Interval Social Updates:  Going down to [place] soon, will be trying to get a COVID swab prior to that as he is asymptomatic  Medications reviewed and reconciled.   Allergies reviewed and reconciled      Objective     Vitals:    01/18/19 0759   BP: 124/70   Pulse: 72   Temp: 36.4 C (97.5 F)   TempSrc: Temporal   Weight: 61.4 kg (135 lb 6.4 oz)   Height: 1.702 m (5\' 7" )     Physical Examination:  Gen: Well-appearing elderly read as Latino American man with wife bedside in NAD  HEENT: NC/AT  CV: Regular rate and rhythm plus 2/6 systolic murmur at LUSB, no rub or gallop  Resp: CTA B posteriorly  Abd: Flat  Extrem: +2 x 3 cm hyperkeratotic macule, stable from prior on RUE posteriorly; + broken right great toenail, mildly loose without erythema/pus evident; DM foot exam: 3/3 bilaterally to monofilament, DP/PT 1+, no lesions outside of the one described  Integ: Above  Neuro: Alert and oriented  Psych: Very pleasant  Objective   Laboratory Reviewed. Pertinent results as follows:  -Most recent A1c 5.6    Imaging Reviewed. Pertinent results as follows:  -None new     Assessment   Tristan Mcbride is 73 y.o. man with medical history of T2DM, BPH, HTN, HLD presenting for routine follow-up, with stable weight and broken right great toenail.    Doing well from healthcare maintenance  standpoint, will vaccinate against flu and shingles today.    Broken toenail well appearing otherwise w/o signs of infection - provided w tape and gauze to loosely dress and await toenail to fall off. Instructed to call clinic w signs/concerns of infection/erythema/pus.    Plan   Active and discussed issues:  1. Toenail avulsion great R toe  - benign appearing today  - gauze/tape provided  - instructed to await for it to fall off    2. Health care maintenance  - CBC; Future  - Flu vaccine quadrivalent greater than or equal to 3yo preservative free IM  - Varicella-Zoster vaccine intramuscular(SHINGRIX)(Shingles)(AMBULATORY USE ONLY))    3. Hypercalcemia  - COMPREHENSIVE METABOLIC PANEL; Future  - cbc as well    4. T2DM (type 2 diabetes mellitus)  - [facility] DIABETES FOOT EXAM    5. Type 2 diabetes mellitus with hyperglycemia, with long-term current use of insulin  - metFORMIN (GLUCOPHAGE) 500 MG tablet; Take 2 tablets (1,000 mg total) by mouth 2 times daily (with meals)  Dispense: 360 tablet; Refill: 0      Orders Placed This Encounter    Flu vaccine quadrivalent greater than or equal to 3yo preservative free IM    Varicella-Zoster  vaccine intramuscular(SHINGRIX)(Shingles)(AMBULATORY USE ONLY))    COMPREHENSIVE METABOLIC PANEL    CBC    HM DIABETES FOOT EXAM    metFORMIN (GLUCOPHAGE) 500 MG tablet     Chronic issues:  BPH, elevated PSA f/b urology, MR guided scan evidence of PI-RADS 3, ongoing f/up w PSA  HTN c/w lisinopril 20  HLD c/w atorvastatin    Health Maintenance:  - Colonoscopy- due 2021  - DEXA scan - n/a  - Lung Cancer - n/a nonsmoker   - Immunization status: missing doses of flu, shingrx - both ordered today.  - AAA screening - n/a nonsmoker  - Hgb A1c - 5.7 last  - Advance care planning (HCP/MOLST) - not discussed    Follow up interval/topics:  -3 mo for DM, weight f/up.     Florian Buff, MD   Internal Medicine PGY-3  [facility] [phone]  01/18/2019 8:55 AM

## 2019-01-23 ENCOUNTER — Other Ambulatory Visit: Payer: Self-pay | Admitting: Physician Assistant

## 2019-01-23 DIAGNOSIS — N400 Enlarged prostate without lower urinary tract symptoms: Secondary | ICD-10-CM

## 2019-01-23 NOTE — Telephone Encounter (Signed)
Dr. Hildred Alamin unavailable. Refill request routed to [place] Dr. Marybelle Killings for processing 01/23/2019 12:28 PM

## 2019-05-03 ENCOUNTER — Ambulatory Visit: Payer: Medicare (Managed Care) | Admitting: Internal Medicine

## 2019-05-05 ENCOUNTER — Telehealth: Payer: Self-pay | Admitting: Internal Medicine

## 2019-05-05 NOTE — Telephone Encounter (Signed)
Copied from [place] [phone]. Topic: Access to Care - Speak to Provider/Office Staff  >> May 05, 2019  1:41 PM Tilden Dome wrote:  Patient's wife [name] states patient unable to get in to the office for his visit on 1/25.      Unable get spanish interpreter on the line to assist with the call.  [name] requesting a return call.       Patient and [name] can be reached [phone]    Writer did not cancel the 1/25 office visit

## 2019-05-05 NOTE — Telephone Encounter (Signed)
Call to pt using interpreter services. Pt asked for the visit to be converted to a tele home visit as they are unable to have live interpreter.    Visit converted to a tele home visit - please use spanish interpreter

## 2019-05-08 ENCOUNTER — Ambulatory Visit: Payer: Medicare (Managed Care) | Admitting: Internal Medicine

## 2019-05-08 DIAGNOSIS — E119 Type 2 diabetes mellitus without complications: Secondary | ICD-10-CM

## 2019-05-08 NOTE — Progress Notes (Signed)
[facility] [id number]  Telephone Visit     This is an established patient visit.    Location of Telemedicine Provider: hospital / clinical location    Reason for visit: No chief complaint on file.      HPI:  Diabetes- Takes metformin 500mg  BID with meals. No reported side effects. Check blood sugars occasionally has had no readings over 200 a few readings in the 80s but none below 80 and has not felt hypoglycemic. Tries to folla a low carb/ low sugar diet. Has continue atorvastatin 20mg  daily.    HTN- taking lisinopril 20mg  daily. Does not regularly check BP outside the office. Denies headaches, chest pain and vision changes. Overall feels well.     Requests Covid vaccine information .     Patient's problem list, allergies, and medications were reviewed and updated as appropriate. Please see the EHR for full details.    Physical Exam:    This visit was performed during a pandemic event, thus the physical exam was not performed.    Assessment Plan:  Diabetes- continue metformin 500mg  BID. Check A1C at next visit to see if this could be reduced. Contiue atorvastatin for cholesterol control.    HTN- continue lisinopril 20mg  daily. Encouraged low sodium diet and physical activity.    Discussed With [place] that he will be getting a call to set up an appointment for his Covid 19 vaccination.     The plan was discussed with the patient and the patient/patient rep demonstrated understanding to the provider's satisfaction.    Consent was previously obtained from the patient to complete this telephone consult; including the potential for financial liability.    11-20 minutes were spent on the phone with the patient, patient representatives, and/or other attendees.               Beverely Risen, [place]

## 2019-05-10 ENCOUNTER — Ambulatory Visit: Payer: Medicare (Managed Care) | Admitting: Optometry

## 2019-05-10 NOTE — Progress Notes (Signed)
This encounter was created in error - please disregard.

## 2019-07-04 ENCOUNTER — Ambulatory Visit: Payer: Medicare (Managed Care) | Attending: Internal Medicine | Admitting: Internal Medicine

## 2019-07-04 ENCOUNTER — Encounter: Payer: Self-pay | Admitting: Internal Medicine

## 2019-07-04 VITALS — BP 116/78 | HR 80 | Temp 98.1°F | Ht 67.0 in | Wt 143.9 lb

## 2019-07-04 DIAGNOSIS — E119 Type 2 diabetes mellitus without complications: Secondary | ICD-10-CM | POA: Insufficient documentation

## 2019-07-04 DIAGNOSIS — R8290 Unspecified abnormal findings in urine: Secondary | ICD-10-CM | POA: Insufficient documentation

## 2019-07-04 DIAGNOSIS — H6120 Impacted cerumen, unspecified ear: Secondary | ICD-10-CM | POA: Insufficient documentation

## 2019-07-04 LAB — URINALYSIS REFLEX TO CULTURE
Blood,UA: NEGATIVE
Glucose,UA: NEGATIVE mg/dL
Ketones, UA: NEGATIVE
Leuk Esterase,UA: NEGATIVE
Nitrite,UA: NEGATIVE
Protein,UA: NEGATIVE mg/dL
Specific Gravity,UA: 1.017 (ref 1.002–1.030)
pH,UA: 7 (ref 5.0–8.0)

## 2019-07-04 LAB — POCT HEMOGLOBIN A1C: Hemoglobin A1C,POC: 5.7 % — ABNORMAL HIGH

## 2019-07-04 NOTE — Patient Instructions (Signed)
Debrox <- ear drops    Se [place] 4 tiempo del dia en la oida derecha    Tambien, se puede poner el agua en la ducha en su oida para limpiarla    llamanos si no estara mejorando en 2 semanas

## 2019-07-04 NOTE — Progress Notes (Signed)
[facility] Progress Note  Reason For Visit: No chief complaint on file.    Subjective   Tristan Mcbride is 74 y.o. 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  Subjective   Inability to hear out of right ear  -Has occurred painlessly last few weeks  -Denies any fever/chills or discharge from ear  -Does not put anything in ear    Malodorous urine  -Endorses malodorous urine for "forever"  -Denies any dietary change or association with the severity of the malodorous urine  -Denies dysuria, hematuria, pain with ejaculation  -His wife attests to the malodor  -Has increased his water intake, after previous recommendations, and this is not changed the odor  -Reports that it appears light yellow, no different than prior    Misc  -Has gained back weight, with resolution of his diarrheal illness previously, has been compliant with Metformin throughout this time  -Has gotten 1/2 Covid vaccines, and is due for the next one in 2 days    ROS    Interval Social Updates:  None new, here with wife  Medications reviewed and reconciled.   Allergies reviewed and reconciled      Objective     Vitals:    07/04/19 1406   BP: 130/80   Pulse: 68   Temp: 36.7 C (98.1 F)   TempSrc: Temporal   Weight: 65.3 kg (143 lb 14.4 oz)   Height: 1.702 m (5\' 7" )     Physical Examination:  Gen: Well-appearing, elderly read as Latino American man sitting in chair in NAD  HEENT: NC/AT  Lymph: No supraclavicular, cervical lymph nodes  CV: Regular rate and rhythm without murmur rub gallop JVP is 12-13 above RA;   Resp: CTA B posteriorly  Abd: Flat, NT/ND, + BS  Extrem: W WP, without lesion BUE  Integ: Without lesion BUE  Neuro: Alert and oriented to conversation  Psych: Very pleasant  Objective   Laboratory Reviewed. Pertinent results as follows:  -A1c today 5.7    Imaging Reviewed. Pertinent results as  follows:  -None new     Assessment   Tristan Mcbride is 74 y.o. 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    Decrease in hearing acuity likely secondary to cerumen, patient deferred cerumen removal today in office, and opted for [facility] (provided information in Spanish and discharge instructions).  Regarding his malodorous urine, will get a UA, but it does not appear to be infectious from chronicity or symptoms for me.  I am unclear as to what is exactly is causing this, as he is not on any offending medications, nor does he change his diet recently/has associations with dietary intake.    From his diabetes standpoint, he continues to be very well controlled, and thankfully his weight has improved after a diarrheal illness has resolved.  We will repeat his creatinine, lipid panel at next visit    Plan   Active and discussed issues:  1. T2DM (type 2 diabetes mellitus)  -C/W Metformin  -No longer on insulin, no need for now  -Advised patient to take his BG's only one symptomatic at this point  - POCT HEMOGLOBIN A1C    2. 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  - AAA screening - n/a  - Hgb A1c - ordered today  - Hep C Ab - negative 04/2017  - HIV - nonreactive last 10/2020  - Advance care planning (HCP/MOLST) - not discussed    Follow up  interval/topics:  -3 months for BMP, lipid panel.     Florian Buff, MD   Internal Medicine PGY-3  [facility] [phone]  07/04/2019 3:09 PM

## 2019-07-08 ENCOUNTER — Other Ambulatory Visit: Payer: Self-pay | Admitting: Pulmonary Disease

## 2019-07-08 DIAGNOSIS — Z23 Encounter for immunization: Secondary | ICD-10-CM

## 2019-07-18 ENCOUNTER — Other Ambulatory Visit: Payer: Self-pay | Admitting: Student in an Organized Health Care Education/Training Program

## 2019-07-18 ENCOUNTER — Other Ambulatory Visit: Payer: Self-pay | Admitting: Physician Assistant

## 2019-07-18 DIAGNOSIS — I10 Essential (primary) hypertension: Secondary | ICD-10-CM

## 2019-07-18 DIAGNOSIS — R3989 Other symptoms and signs involving the genitourinary system: Secondary | ICD-10-CM

## 2019-07-18 DIAGNOSIS — N400 Enlarged prostate without lower urinary tract symptoms: Secondary | ICD-10-CM

## 2019-07-18 NOTE — Telephone Encounter (Signed)
Medication request routed to Dr. Hildred Alamin for processing 07/18/2019 10:31 AM    If appropriate, please refill the script for 90 days.

## 2019-07-19 ENCOUNTER — Ambulatory Visit: Payer: Medicare (Managed Care) | Admitting: Student in an Organized Health Care Education/Training Program

## 2019-07-19 ENCOUNTER — Encounter: Payer: Self-pay | Admitting: Student in an Organized Health Care Education/Training Program

## 2019-07-20 ENCOUNTER — Telehealth: Payer: Self-pay | Admitting: Internal Medicine

## 2019-07-20 MED ORDER — TAMSULOSIN HCL 0.4 MG PO CAPS *I*
0.4000 mg | ORAL_CAPSULE | Freq: Every evening | ORAL | 1 refills | Status: DC
Start: 2019-07-20 — End: 2020-02-15

## 2019-07-20 NOTE — Telephone Encounter (Signed)
Unable to reach patient, left a message with assist of Spanish interpreter # [phone] on voicemail.

## 2019-07-20 NOTE — Telephone Encounter (Signed)
Copied from [place] [id number]. Topic: Access to Care - Labs/Orders/Imaging  >> Jul 20, 2019  9:09 AM Verda Cumins wrote:  Tristan Mcbride (wife) said patient had a urine test two weeks ago and she is requesting the results.      His urine still has a foul odor.      Her contact number is [phone].

## 2019-07-21 NOTE — Telephone Encounter (Signed)
Spoke with patient's wife [name] with assist of [place] interpreter # [phone] as patient is at work/unavailable.  Informed [name] that patient's urine results came back negative. Inquired if patient was having any symptoms. [name] stated that patient was not.  Informed [name] to have patient give [place] a call if needed. [name] verbalized understanding.

## 2019-08-02 ENCOUNTER — Ambulatory Visit: Payer: Medicare (Managed Care) | Admitting: Optometry

## 2019-08-02 ENCOUNTER — Encounter: Payer: Self-pay | Admitting: Optometry

## 2019-08-02 DIAGNOSIS — H524 Presbyopia: Secondary | ICD-10-CM

## 2019-08-02 DIAGNOSIS — H52203 Unspecified astigmatism, bilateral: Secondary | ICD-10-CM

## 2019-08-02 DIAGNOSIS — H2513 Age-related nuclear cataract, bilateral: Secondary | ICD-10-CM

## 2019-08-02 DIAGNOSIS — H40003 Preglaucoma, unspecified, bilateral: Secondary | ICD-10-CM

## 2019-08-02 DIAGNOSIS — H04123 Dry eye syndrome of bilateral lacrimal glands: Secondary | ICD-10-CM

## 2019-08-02 NOTE — Progress Notes (Signed)
Outpatient Visit      Patient name: Tristan Mcbride  DOB: 01/19/1946       Age: 74 y.o.  MR#: [id number]    Encounter Date: 08/02/2019    Subjective:      Chief Complaint   Patient presents with    Follow-up     IOP/Pachs     HPI     Follow-up      Additional comments: IOP/Pachs              Comments     FUV: LEE 11/04/18 dilated (Dr. Yvone Neu)    Pt states vision and comfort post last visit is about the same patient   reports no concerns, no tearing. (-)floaters/flashes/pain. At work he is   constantly getting dust in his eyes.    DM2 FBG: pt doesn't check HbA1c: 5.6%(11/02/18)  Gtts: uses solution/?saline to rinse his eyes out when he gets home from   work to get the dust out          Last edited by Charyl Bigger, OD on 08/02/2019  2:04 PM. (History)        has a current medication list which includes the following prescription(s): lisinopril, tamsulosin, metformin, senna, metamucil smooth texture, blood glucose monitor system, onetouch verio, atorvastatin, onetouch verio, lancets 30g, alcohol swabs, [DISCONTINUED] insulin glargine, and naproxen sodium.     is allergic to tetanus toxoids and no known latex allergy.      Past Medical History:   Diagnosis Date    BPH (benign prostatic hypertrophy)     DM (diabetes mellitus) 10/13/2017    Esophageal reflux     Fibrolipoma     intramuscular adipose tissue    Hyperlipidemia     Hypertension     Hypoglycemia     Kidney cyst     Rotator cuff tendinitis     Right    Sexual dysfunction     absent ejaculation    Type 2 diabetes mellitus     Weight loss       Past Surgical History:   Procedure Laterality Date    KNEE SURGERY      left    LEG TENDON SURGERY      Right        Specialty Problems        Ophthalmology Problems    DM (diabetes mellitus)               ROS     Positive for: Endocrine, Eyes    Negative for: Constitutional, Gastrointestinal, Neurological, Skin,   Genitourinary, Musculoskeletal, HENT, Cardiovascular, Respiratory,   Psychiatric, Allergic/Imm,  Heme/Lymph    Last edited by Absence, Marie on 08/02/2019 11:57 AM. (History)         Objective:     Base Eye Exam     Visual Acuity (Snellen - Linear)       Right Left    Dist sc 20/40 -1 20/30 -2          Tonometry (Tonopen, 12:02 PM)       Right Left    Pressure 16 14          Tonometry #2 (Applanation, 12:26 PM)       Right Left    Pressure 14 13          Pachymetry (08/02/2019)       Right Left    Thickness 558 551          Pupils  Pupils APD    Right PERRLA None    Left PERRLA None          Extraocular Movement       Right Left     Full Full          Neuro/Psych     Oriented x3: Yes    Mood/Affect: Normal            Slit Lamp and Fundus Exam     External Exam       Right Left    External Normal ocular adnexae, lacrimal gland & drainage, orbits Normal ocular adnexae, lacrimal gland & drainage, orbits          Slit Lamp Exam       Right Left    Lids/Lashes 1+ Meibomian gland dysfunction 1+ Meibomian gland dysfunction    Conjunctiva/Sclera Conjunctivochalasis, trace melanosis trace melanosis, temp pinguecula,, Conjunctivochalasis    Cornea trace PEE post tonopen just inf to visual axis slightly irregular tear film    Anterior Chamber Clear & deep Clear & deep    Iris Normal shape, size, morphology Normal shape, size, morphology    Lens 2+ Nuclear sclerosis, few posterior vacoules 2+ Nuclear sclerosis, trace posterior vacoules    Vitreous Posterior vitreous detachment Posterior vitreous detachment            Refraction     Manifest Refraction       Sphere Cylinder Axis Dist VA Add [place]    Right -0.25 -0.25 163 20/30+1 +2.25 20/20 OU    Left [place] -0.50 057 20/25-1 +2.25           Final Rx       Sphere Cylinder Axis Dist VA Add [place]    Right -0.25 -0.25 163 20/30+1 +2.25 20/20 OU    Left [place] -0.50 057 20/25-1 +2.25     Type: PAL    Expiration Date: 08/02/2021              Final Rx       Sphere Cylinder Axis Dist VA Add [place]    Right -0.25 -0.25 163 20/30+1 +2.25 20/20 OU    Left [place] -0.50 057 20/25-1  +2.25     Type: PAL    Expiration Date: 08/02/2021                No annotated images are attached to the encounter.      Assessment/Plan:      1. Glaucoma suspect of both eyes     2. Nuclear sclerosis of both eyes     3. Dry eye syndrome of both eyes     4. Astigmatism of both eyes with presbyopia          PLAN:    1. Glaucoma suspect vs. Physiologic cupping OU, ?progression comparing 2008 photos and 2019 photos, although rim tissue appears healthy.  -     IOP:  Normal IOP, 14/13 on applanation which was reliable  -     OCT:  Within normal limits, without signs of thinning, good avg rnfl, no previous to compare to but last HVF from 2012 appeared normal  -pachymetry is normal today  Based on current data, will monitor for now, will monitor in 6 months, then can be annually thereafter if stable  - Return for DFE/IOP/OCT RNFL OU    2. Patient ed on cataracts, educated appears slightly visually significant and is most consistent with his vision, but surgical extraction not  warranted at this time, patient okay to wait for surgery at this time and will try glasses first, will consider in the future, monitor in 6 months with dilation    3. Discussed findings of mild dry eyes in both eyes. Patient reports gets a lot of dust in his eyes after work. Educated on the following treatment plan:  -Patient educated to use artificial tears such as refresh, blink, systane or soothe XP in both eyes as needed up to four times per day. If using >4x/day recommend preservative free artificial tears.  -Can continue saline to wash dust out of eyes as needed after work  -Call or return to clinic as needed if notices changes in vision, loss of vision, new ocular symptoms or worsening of symptoms.    4. Spectacle Rx reprinted and given to patient today as he did not get glasses after his last visit    RTC 3-6 months for DFE/Annual examination/OCT RNFL OU, sooner as needed    I personally spent 30-39 minutes of today's visit with the patient  which may include pre/post visit work on the day of this encounter. This may include reviewing chart/preparing for patient, documentation, ordering medications, performing examination, obtaining history, coordinating care, interpreting results, and counseling the patient on their vision and visual function, eye conditions, treatment and answering any questions they had.

## 2019-08-02 NOTE — Patient Instructions (Signed)
Call or return to clinic as needed if notices changes in vision, loss of vision, new ocular symptoms or worsening of symptoms.    Patient educated to use artificial tears such as refresh, blink, systane or soothe XP in both eyes as needed up to four times per day. If using >4x/day recommend preservative free artificial tears.    Can continue saline to wash dust out of eyes as needed after work

## 2019-09-12 ENCOUNTER — Other Ambulatory Visit: Payer: Self-pay | Admitting: Student in an Organized Health Care Education/Training Program

## 2019-09-12 DIAGNOSIS — R3989 Other symptoms and signs involving the genitourinary system: Secondary | ICD-10-CM

## 2019-09-13 ENCOUNTER — Encounter: Payer: Self-pay | Admitting: Student in an Organized Health Care Education/Training Program

## 2019-09-13 ENCOUNTER — Ambulatory Visit: Payer: 59 | Admitting: Student in an Organized Health Care Education/Training Program

## 2019-09-15 ENCOUNTER — Telehealth: Payer: Self-pay | Admitting: Student in an Organized Health Care Education/Training Program

## 2019-09-15 NOTE — Telephone Encounter (Unsigned)
Copied from [place] [phone]. Topic: Appointments - Reschedule Appointment  >> Sep 15, 2019  8:31 AM Arturo Morton wrote:  Addison Lank patients wife called to reschedule missed appointment. The appointment has been rescheduled to the soonest available 10/25/19 with Dr.Osinski.

## 2019-09-23 ENCOUNTER — Other Ambulatory Visit: Payer: Self-pay | Admitting: Cardiology

## 2019-09-23 DIAGNOSIS — E785 Hyperlipidemia, unspecified: Secondary | ICD-10-CM

## 2019-09-25 NOTE — Telephone Encounter (Signed)
Dr. Hildred Alamin unavailable. Refill request routed to [facility] Dr. Randall Hiss for processing 09/25/2019 8:05 AM    If appropriate, please refill the script for 90 days.

## 2019-10-24 ENCOUNTER — Other Ambulatory Visit: Payer: Self-pay | Admitting: Student in an Organized Health Care Education/Training Program

## 2019-10-24 DIAGNOSIS — R3989 Other symptoms and signs involving the genitourinary system: Secondary | ICD-10-CM

## 2019-10-25 ENCOUNTER — Encounter: Payer: Self-pay | Admitting: Student in an Organized Health Care Education/Training Program

## 2019-10-25 ENCOUNTER — Ambulatory Visit: Payer: 59 | Admitting: Student in an Organized Health Care Education/Training Program

## 2019-10-25 VITALS — Ht 67.0 in | Wt 143.0 lb

## 2019-10-25 DIAGNOSIS — R972 Elevated prostate specific antigen [PSA]: Secondary | ICD-10-CM

## 2019-10-25 DIAGNOSIS — R3989 Other symptoms and signs involving the genitourinary system: Secondary | ICD-10-CM

## 2019-10-25 LAB — POCT URINALYSIS DIPSTICK
Blood,UA POCT: NEGATIVE
Glucose,UA POCT: NORMAL mg/dL
Ketones,UA POCT: NEGATIVE mg/dL
Leuk Esterase,UA POCT: NEGATIVE
Lot #: 53600802
Nitrite,UA POCT: NEGATIVE
PH,UA POCT: 7 (ref 5–8)
Protein,UA POCT: NEGATIVE mg/dL
Specific gravity,UA POCT: 1.01 (ref 1.002–1.030)

## 2019-10-25 NOTE — Progress Notes (Signed)
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     Patient here for routine followup appointment. No repeat PSA done as patient wasn't sure when to obtain it. Denies any changes in history since last visit. Reports he is voiding without any issues or concerns. Taking flomax regularly and states has been helping. Overall no complaints.         REVIEW of SYSTEMS    Review of Systems   Constitutional: Negative.    HENT: Negative.    Eyes: Negative.    Respiratory: Negative.    Cardiovascular: Negative.    Gastrointestinal: Negative.    Genitourinary:        Per HPI   Musculoskeletal: Negative.    Skin: Negative.    Neurological: Negative.    Endo/Heme/Allergies: Negative.    Psychiatric/Behavioral: Negative.          Vital signs: Height 1.702 m (5' 7"), weight 64.9 kg (143 lb).    Physical examination:  General : Normal  Neurologic: Oriented to person,place, time  Psychiatric : Normal mood and affect  Skin : Normal color, turgor, texture, hydration  Neck : Supple  Respiratory : normal respiratory effort. Normal work of breathing.   Uro: no Foley, normal phallus, testes descended bilaterally.  DRE: symmetrically enlarged prostate, soft, no noted nodules   Ext: no edema  Abd: soft    Medications:   Current Outpatient Medications   Medication    atorvastatin (LIPITOR) 20 MG tablet    lisinopril (PRINIVIL,ZESTRIL) 20 MG tablet    tamsulosin (FLOMAX) 0.4 MG capsule    metFORMIN (GLUCOPHAGE) 500 MG tablet    senna (SENOKOT) 8.6 MG tablet    psyllium (METAMUCIL SMOOTH TEXTURE) 28.3 % POWD powder    blood glucose monitor system    blood glucose monitor w/Device KIT    blood glucose (ONETOUCH VERIO) test  strip    Lancets 30G MISC    alcohol swabs pads    naproxen sodium (ANAPROX) 220 MG tablet     No current facility-administered medications for this visit.       Allergies:   Allergies   Allergen Reactions    Tetanus Toxoids      On Amb Int Med note from 08/20/09    No Known Latex Allergy        Past medical history:   Past Medical History:   Diagnosis Date    BPH (benign prostatic hypertrophy)     DM (diabetes mellitus) 10/13/2017    Esophageal reflux     Fibrolipoma     intramuscular adipose tissue    Hyperlipidemia     Hypertension     Hypoglycemia     Kidney cyst     Rotator cuff tendinitis     Right    Sexual dysfunction     absent ejaculation    Type 2 diabetes mellitus     Weight loss        Past surgical history:   Past Surgical History:   Procedure Laterality Date    KNEE SURGERY      left    LEG TENDON SURGERY  Right       Family history:   Family History   Problem Relation Age of Onset    Heart Disease Mother     Diabetes Mother     Diabetes Father     Stroke Father     Cerebral Palsy Father     Breast cancer Maternal Aunt     Colon cancer Neg Hx     Esophageal cancer Neg Hx     Liver cancer Neg Hx     Pancreatic Cancer Neg Hx     Rectal cancer Neg Hx     Stomach cancer Neg Hx        Social History:   Social History     Socioeconomic History    Marital status: Married     Spouse name: Not on file    Number of children: Not on file    Years of education: Not on file    Highest education level: Not on file   Occupational History    Not on file   Tobacco Use    Smoking status: Never Smoker    Smokeless tobacco: Never Used   Substance and Sexual Activity    Alcohol use: No    Drug use: No    Sexual activity: Yes     Partners: Female   Social History Narrative    Not on file         Urine analysis shows :   Recent Results (from the past 24 hour(s))   POCT urinalysis dipstick    Collection Time: 10/25/19  3:16 PM   Result Value Ref Range    Specific gravity,UA POCT  1.010 1.002 - 1.030    PH,UA POCT 7.0 5 - 8    Leuk Esterase,UA POCT Negative Negative    Nitrite,UA POCT Negative Negative    Protein,UA POCT Negative Negative mg/dL    Glucose,UA POCT Normal Normal mg/dL    Ketones,UA POCT Negative Negative mg/dL    Urobilinogen,UA Test Not Performed Less than 1 mg/dL    Bilirubin,Ur Test Not Performed Negative, Test Not Performed    Blood,UA POCT Negative Negative    Exp date 01/10/2021     Lot # [id number]        Radiologic evaluation included: none  MR Prostate 11/24/2018  Signed By:  Eliezer Bottom, MD on 11/24/2018  2:02 PM      2.1 m lesion in the left apical and mid anterior transition zone corresponding to: PIRADS assessment category 3, Intermediate (the presence of clinically significant cancer is equivocal).      2.3 cm lesion in the left apical posterior transition zone corresponding to: PIRADS assessment category 3, Intermediate (the presence of clinically significant cancer is equivocal).      Subcentimeter zone of abnormal signal in the left proximal femur is nonspecific. Consider bone scintigraphy if prostate cancer is confirmed.      Mild distal left ureteral dilatation of uncertain clinical significance. Diverticulosis.             Assessment/Plan:  This is a 74 y.o. 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 Discussed with patient given high grade PIN with the PIRADS 3 lesions would need continued close surveillance of prostate cancer      - Repeat Free and Total PSA. PHI. In a couple of  weeks  - Repeat MR prostate   - Continue flomax  - RTC 4 weeks    1. Elevated PSA  - Prostate Health Index (phi),S; Future  - PSA, total and free; Future  - MR Prostate without and with contrast; Future    2. Urinary problem  - POCT urinalysis dipstick      [facility] Jaquita Rector, MD

## 2019-10-31 NOTE — Progress Notes (Signed)
Urinary Problems:    Visit conducted with spanish language translator    Pt well known to me  Mr. 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     Past Medical History:   Diagnosis Date    BPH (benign prostatic hypertrophy)     DM (diabetes mellitus) 10/13/2017    Esophageal reflux     Fibrolipoma     intramuscular adipose tissue    Hyperlipidemia     Hypertension     Hypoglycemia     Kidney cyst     Rotator cuff tendinitis     Right    Sexual dysfunction     absent ejaculation    Type 2 diabetes mellitus     Weight loss        Past Surgical History:   Procedure Laterality Date    KNEE SURGERY      left    LEG TENDON SURGERY      Right       Current Outpatient Medications on File Prior to Visit   Medication Sig Dispense Refill    atorvastatin (LIPITOR) 20 MG tablet TAKE 1 TABLET BY MOUTH DAILY WITH DINNER 90 tablet 1    lisinopril (PRINIVIL,ZESTRIL) 20 MG tablet take 1 tablet by mouth once daily 180 tablet 1    tamsulosin (FLOMAX) 0.4 MG capsule Take 1 capsule (0.4 mg total) by mouth every evening 90 capsule 1    metFORMIN (GLUCOPHAGE) 500 MG tablet Take 1 tablet (500 mg total) by mouth 2 times daily (with meals) 180 tablet 0    senna (SENOKOT) 8.6 MG tablet Take 2 tablets by mouth daily (Patient not taking: Reported on 10/25/2019) 90 tablet 1    psyllium (METAMUCIL SMOOTH TEXTURE) 28.3 % POWD powder 1 tablespoon in 8 oz of water daily by mouth. (Patient not taking: Reported on 10/25/2019) 1 Bottle 3    blood glucose monitor system Use as directed to test blood glucose. (Patient not taking: Reported on 01/18/2019) 1 kit 0    blood glucose monitor w/Device KIT Test as directed (Patient not taking: Reported on 01/18/2019) 1 kit 0    blood glucose (ONETOUCH VERIO) test strip TEST twice a day (Patient not taking: Reported on 01/18/2019)  100 strip 0    Lancets 30G MISC Use as directed. Dispense lowest cost lancet based on patient's insurance. (Patient not taking: Reported on 10/25/2019) 100 each 5    alcohol swabs pads Use as directed to test blood sugar twice daily. (Patient not taking: Reported on 10/25/2019) 100 each 5    [DISCONTINUED] insulin glargine (LANTUS SOLOSTAR) 100 unit/mL injection pen Inject 6 Units into the skin nightly 3 mL 2    naproxen sodium (ANAPROX) 220 MG tablet Take 220 mg by mouth as needed       No current facility-administered medications on file prior to visit.       Social History     Socioeconomic History    Marital status: Married     Spouse name: Not on file    Number of children: Not on file    Years of education: Not on file    Highest education level: Not on file   Occupational History    Not on file   Tobacco Use    Smoking status: Never Smoker  Smokeless tobacco: Never Used   Substance and Sexual Activity    Alcohol use: No    Drug use: No    Sexual activity: Yes     Partners: Female   Social History Narrative    Not on file     General appearance - well appearing, NAD  Respirations unlabored  Cardiac regular rhythm  Abdomen - soft, no masses, no hepatosplenomegaly, non-distended bladder, no inguinal hernia, no tenderness  No results found.            1. Elevated PSA  - Prostate Health Index (phi),S; Future  - PSA, total and free; Future  - MR Prostate without and with contrast; Future    2. Urinary problem  - POCT urinalysis dipstick     Return in about 4 weeks (around 11/22/2019) for Follow-up, PSA, MR prostate followup.          The pt was seen and examined with the medical housestaff and I was physically present during the key elements of the encounter. I have edited the above note and agree with the outlined history, physical findings, assessment and management plans and I have supervised the clinical decision making.     Lorenso Quarry, MD  Professor of Urology  Department of  Urology  [facility]

## 2019-11-01 ENCOUNTER — Telehealth: Payer: Self-pay | Admitting: Urology

## 2019-11-01 DIAGNOSIS — N400 Enlarged prostate without lower urinary tract symptoms: Secondary | ICD-10-CM

## 2019-11-01 NOTE — Telephone Encounter (Signed)
Patient scheduled for MRI @ [facility] on 8/6 @ 5:40 pm--npo 4 hours-will need blood work    Tristan Mcbride    Can you call the patient with an interpreter    Thanks    Omkar Stratmann

## 2019-11-01 NOTE — Telephone Encounter (Signed)
I called the [employer] and used operator [phone] to notify

## 2019-11-01 NOTE — Telephone Encounter (Signed)
BMP orders in  Thanks you

## 2019-11-01 NOTE — Telephone Encounter (Signed)
Please place BMP order for imaging

## 2019-11-14 ENCOUNTER — Other Ambulatory Visit
Admission: RE | Admit: 2019-11-14 | Discharge: 2019-11-14 | Disposition: A | Discharge status: Home / Self Care | Payer: 59 | Source: Ambulatory Visit | Attending: Urology | Admitting: Urology

## 2019-11-14 DIAGNOSIS — N400 Enlarged prostate without lower urinary tract symptoms: Secondary | ICD-10-CM | POA: Insufficient documentation

## 2019-11-14 DIAGNOSIS — R972 Elevated prostate specific antigen [PSA]: Secondary | ICD-10-CM | POA: Insufficient documentation

## 2019-11-14 DIAGNOSIS — Z125 Encounter for screening for malignant neoplasm of prostate: Secondary | ICD-10-CM | POA: Insufficient documentation

## 2019-11-14 LAB — BASIC METABOLIC PANEL
Anion Gap: 13 (ref 7–16)
CO2: 24 mmol/L (ref 20–28)
Calcium: 10.2 mg/dL (ref 8.6–10.2)
Chloride: 105 mmol/L (ref 96–108)
Creatinine: 1.22 mg/dL — ABNORMAL HIGH (ref 0.67–1.17)
GFR,Black: 68 *
GFR,Caucasian: 59 * — AB
Glucose: 150 mg/dL — ABNORMAL HIGH (ref 60–99)
Lab: 24 mg/dL — ABNORMAL HIGH (ref 6–20)
Potassium: 3.9 mmol/L (ref 3.3–5.1)
Sodium: 142 mmol/L (ref 133–145)

## 2019-11-14 LAB — PSA, TOTAL AND FREE
PSA (eff. 4-2010): 9 ng/mL — ABNORMAL HIGH (ref 0.00–4.00)
PSA,FREE (eff. 4-2010): 3.34 ng/mL
PSA,Free %: 37 %

## 2019-11-14 LAB — MULTIPLE ORDERING DOCS

## 2019-11-16 LAB — PROSTATE HEALTH INDEX (PHI),S: Prostate Specific: 7.6 ng/mL — ABNORMAL HIGH (ref <=6.5)

## 2019-11-16 LAB — PROSTATE HEALTH INDEX
%Free PSA: 43 %
Free PSA: 3.3 ng/mL
Prostate Health Index: 28.3
[-2]ProPSA: 33.9 pg/mL

## 2019-11-17 ENCOUNTER — Ambulatory Visit
Admission: RE | Admit: 2019-11-17 | Discharge: 2019-11-17 | Disposition: A | Discharge status: Home / Self Care | Payer: 59 | Source: Ambulatory Visit

## 2019-11-17 DIAGNOSIS — R972 Elevated prostate specific antigen [PSA]: Secondary | ICD-10-CM

## 2019-11-17 DIAGNOSIS — K8689 Other specified diseases of pancreas: Secondary | ICD-10-CM

## 2019-11-17 MED ORDER — GADOTERIDOL 279.3 MG/ML (PROHANCE) IV SOLN *I*
20.0000 mL | Freq: Once | INTRAVENOUS | Status: AC
Start: 2019-11-17 — End: 2019-11-17
  Administered 2019-11-17: 20 mL via INTRAVENOUS

## 2019-11-30 ENCOUNTER — Other Ambulatory Visit: Payer: Self-pay | Admitting: Physician Assistant

## 2019-11-30 DIAGNOSIS — Z794 Long term (current) use of insulin: Secondary | ICD-10-CM

## 2019-11-30 DIAGNOSIS — E119 Type 2 diabetes mellitus without complications: Secondary | ICD-10-CM

## 2019-11-30 NOTE — Telephone Encounter (Signed)
Dr. Harold Hedge unavailable. Refill request routed to [facility] Dr. Darcel Bayley for processing 11/30/2019 10:00 AM    If appropriate, please refill the script for 90 days.

## 2020-01-15 ENCOUNTER — Other Ambulatory Visit: Payer: Self-pay | Admitting: Internal Medicine

## 2020-01-15 MED ORDER — TRIAMCINOLONE ACETONIDE 0.1 % EX CREA *I*
TOPICAL_CREAM | Freq: Two times a day (BID) | CUTANEOUS | 5 refills | Status: AC
Start: 2020-01-15 — End: 2020-04-14

## 2020-01-15 NOTE — Telephone Encounter (Signed)
Medication request routed to [facility] for processing 01/15/2020 @10 :01 AM

## 2020-02-03 ENCOUNTER — Ambulatory Visit: Payer: 59 | Admitting: Optometry

## 2020-02-15 ENCOUNTER — Other Ambulatory Visit: Payer: Self-pay | Admitting: Internal Medicine

## 2020-02-15 DIAGNOSIS — N400 Enlarged prostate without lower urinary tract symptoms: Secondary | ICD-10-CM

## 2020-02-15 MED ORDER — TAMSULOSIN HCL 0.4 MG PO CAPS *I*
0.4000 mg | ORAL_CAPSULE | Freq: Every evening | ORAL | 1 refills | Status: DC
Start: 2020-02-15 — End: 2020-08-08

## 2020-02-15 NOTE — Telephone Encounter (Signed)
Medication request routed to [facility] for processing 02/15/2020 @3 :55 PM

## 2020-02-15 NOTE — Telephone Encounter (Signed)
Copied from [place] #[id number]. Topic: Medications/Prescriptions - Refill Request  >> Feb 15, 2020  2:45 PM Crista Elliot wrote:  Elnita Maxwell spouse, [name] calling to request prescription(s) tamsulosin (FLOMAX) 0.4 MG capsule  to be sent to the following Pharmacy [facility] [place], [place].    Is patient out of the medication? yes    Patient's last visit? 07/04/19    Patient can be reached if necessary at [phone] (Work Phone) Preferred phone number .

## 2020-02-26 NOTE — Progress Notes (Signed)
AC5 OFFICE NOTE:    Chief Complaint   Patient presents with    Follow-up      SUBJECTIVE:   This visit was conducted with an in-person Spanish interpreter. Tristan Mcbride is a 74 y.o. male with history of HTN, T2DM, BPH, and GERD who presents today with his wife for follow-up. The patient was last seen in our clinic in March 2021, at which time he had complaints of right ear hearing difficulty thought to be secondary to cerumen impaction, as well as chronic malodorous urine of uncertain etiology in the absence of symptoms concerning for urinary tract infection. These symptoms have since resolved. The patient notes that he is feeling very well. He does not have any acute complaints. With regard to his diabetes, he endorses good compliance with his medication. He follows with Ophthalmology and denies any problems with his vision, but does note that he may begin tearing some if he stares at the television for too long. He denies any numbness or tingling in his lower extremities, he denies any skin changes to his feet. He does note that he has been eating more rice and beans, as well as sugary fruit recently. He notes that his wife is giving him big portions, and we discussed the importance of portion size for glycemic control. He notes that he did not take his lisinopril this morning, but reports that this was atypical. He denies any chest pain, palpitations, shortness of breath, abdominal pain, nausea, emesis, constipation, diarrhea, or urinary complaints.    Current Outpatient Medications   Medication Sig    blood glucose (ONETOUCH VERIO) test strip TEST twice a day    tamsulosin (FLOMAX) 0.4 mg capsule Take 1 capsule (0.4 mg total) by mouth every evening    triamcinolone (KENALOG) 0.1 % cream Apply topically 2 times daily  APPLY TO AFFECTED AREA    metFORMIN (GLUCOPHAGE) 500 MG tablet take 1 tablet by mouth twice a day with meals    atorvastatin (LIPITOR) 20 MG tablet TAKE 1 TABLET BY MOUTH DAILY WITH DINNER     lisinopril (PRINIVIL,ZESTRIL) 20 MG tablet take 1 tablet by mouth once daily    blood glucose monitor system Use as directed to test blood glucose.    blood glucose monitor w/Device KIT Test as directed    alcohol swabs pads Use as directed to test blood sugar twice daily.    Lancets 30G MISC Use as directed. Dispense lowest cost lancet based on patient's insurance.    senna (SENOKOT) 8.6 mg tablet Take 2 tablets by mouth daily    blood pressure monitor, automatic with arm cuff Use as directed to monitor blood pressure    psyllium (METAMUCIL SMOOTH TEXTURE) 28.3 % POWD powder 1 tablespoon in 8 oz of water daily by mouth. (Patient not taking: Reported on 10/25/2019)    naproxen sodium (ANAPROX) 220 MG tablet Take 220 mg by mouth as needed (Patient not taking: Reported on 02/27/2020)         OBJECTIVE:  BP 164/84    Pulse 60    Temp 36.6 C (97.9 F) (Temporal)    Wt 71.4 kg (157 lb 4.8 oz)    BMI 24.64 kg/m      General: Patient sitting in chair calm, cooperative, in no acute distress.  Resp: Normal work of breathing, lungs clear to auscultation bilaterally, no wheezes/rhonchi/rales.  CV: Regular rate & rhythm, normal S1/S2, no murmurs/rubs/gallops.  GI: Bowel sounds present, soft, non-tender, non-distended.  Ext: No edema, peripheral pulses  intact. Calluses over bilateral first metatarsophalangeal joint. No evidence of ulcer or other skin breakdown. Monofilament 10/10 bilaterally.    ASSESSMENT & PLAN:  Tristan Mcbride is a 74 y.o. male with history of HTN, T2DM, BPH, and GERD who presents today for follow-up. His diabetes remains excellently controlled despite slight increase in A1c. The patient understands the portion of limiting carbohydrate intake, as well as the importance of portion control, and agrees to work on this. No medication changes at this time. He continues to follow with Ophthalmology, and we will refer to Podiatry today, as well. With regard to his hypertension, feel that elevated office pressure  more related to failure to take medication this morning instead of poor overall control. Will have patient check pressures at home. BPH is stable, and patient continues to follow with Urology.    #Type 2 Diabetes Mellitus  -Last A1c 5.7% in March 2021. Repeat A1c today 5.9%.  -Continue with metformin 500 mg bid.  -Urine microalbumin/creatinine within normal limits July 2016. Will recheck today. Continue with lisinopril.  -Follows with Ophthalmology. Last appointment April 2021.  -Ambulatory Referral to Podiatry.    #Hypertension  -Home blood pressure cuff ordered.  -Continue with lisinopril as above.    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 Repeat scan in August 2021, unchanged risk assessment.  -Continue with tamsulosin 0.4 mg nightly.  -Follows with Urology.    HEALTH MAINTENANCE:  -Colonoscopy (adults 61-75) - Due November 2021. Ambulatory Referral to Colorectal Surgery placed.  -Immunizations - Influenza, Shingrix. Patient would like flu shot today.  -Lipid Panel - Will recheck today.    Orders Placed This Encounter    Microalbumin, Urine, Random    Creatinine, urine    Lipid Panel (Reflex to Direct  LDL if Triglycerides more than 400)    AMB REFERRAL TO COLON & RECTAL SURGERY    AMB REFERRAL TO PODIATRY    POCT HEMOGLOBIN A1C    blood glucose (ONETOUCH VERIO) test strip    Lancets 30G MISC    senna (SENOKOT) 8.6 mg tablet    blood pressure monitor, automatic with arm cuff     Liliane Channel, M.D. 02/27/2020 at 3:56 PM

## 2020-02-27 ENCOUNTER — Ambulatory Visit: Payer: 59 | Attending: Internal Medicine | Admitting: Internal Medicine

## 2020-02-27 ENCOUNTER — Other Ambulatory Visit
Admission: RE | Admit: 2020-02-27 | Discharge: 2020-02-27 | Disposition: A | Discharge status: Home / Self Care | Payer: 59 | Source: Ambulatory Visit

## 2020-02-27 ENCOUNTER — Encounter: Payer: Self-pay | Admitting: Internal Medicine

## 2020-02-27 VITALS — BP 164/84 | HR 60 | Temp 97.9°F | Wt 157.3 lb

## 2020-02-27 DIAGNOSIS — Z1211 Encounter for screening for malignant neoplasm of colon: Secondary | ICD-10-CM | POA: Insufficient documentation

## 2020-02-27 DIAGNOSIS — Z13220 Encounter for screening for lipoid disorders: Secondary | ICD-10-CM | POA: Insufficient documentation

## 2020-02-27 DIAGNOSIS — Z23 Encounter for immunization: Secondary | ICD-10-CM | POA: Insufficient documentation

## 2020-02-27 DIAGNOSIS — I10 Essential (primary) hypertension: Secondary | ICD-10-CM | POA: Insufficient documentation

## 2020-02-27 DIAGNOSIS — K5900 Constipation, unspecified: Secondary | ICD-10-CM

## 2020-02-27 DIAGNOSIS — E119 Type 2 diabetes mellitus without complications: Secondary | ICD-10-CM | POA: Insufficient documentation

## 2020-02-27 LAB — POCT HEMOGLOBIN A1C: Hemoglobin A1C,POC: 5.9 % — ABNORMAL HIGH

## 2020-02-27 LAB — MICROALBUMIN, URINE, RANDOM
Creatinine,UR: 79 mg/dL (ref 20–300)
Microalb/Creat Ratio: 98 mg MA/g CR — ABNORMAL HIGH (ref 0.0–29.9)
Microalbumin,UR: 7.74 mg/dL

## 2020-02-27 LAB — LIPID PANEL
Chol/HDL Ratio: 2.3
Cholesterol: 126 mg/dL
HDL: 54 mg/dL (ref 40–60)
LDL Calculated: 55 mg/dL
Non HDL Cholesterol: 72 mg/dL
Triglycerides: 87 mg/dL

## 2020-02-27 MED ORDER — LANCETS 30G MISC
5 refills | Status: DC
Start: 2020-02-27 — End: 2021-10-31

## 2020-02-27 MED ORDER — BLOOD PRESSURE MONITOR/ARM DEVI *A*
0 refills | Status: AC
Start: 2020-02-27 — End: ?

## 2020-02-27 MED ORDER — SENNOSIDES 8.6 MG PO TABS *I*
2.0000 | ORAL_TABLET | Freq: Every day | ORAL | 1 refills | Status: DC
Start: 2020-02-27 — End: 2023-09-27

## 2020-02-27 MED ORDER — ONETOUCH VERIO VI STRP *A*
ORAL_STRIP | 0 refills | Status: DC
Start: 2020-02-27 — End: 2021-10-31

## 2020-02-27 NOTE — Patient Instructions (Addendum)
-  Work on decreasing portion size for better control of your diabetes.  -Remember to take you lisinopril, which will help with high blood pressure and kidney protection.  -Check your blood pressure at home.  -Set up colonoscopy for after January.  -Set up appointment with Podiatry.

## 2020-04-09 ENCOUNTER — Other Ambulatory Visit: Payer: Self-pay | Admitting: Internal Medicine

## 2020-04-09 DIAGNOSIS — E785 Hyperlipidemia, unspecified: Secondary | ICD-10-CM

## 2020-04-09 MED ORDER — ATORVASTATIN CALCIUM 20 MG PO TABS *I*
20.0000 mg | ORAL_TABLET | Freq: Every day | ORAL | 1 refills | Status: DC
Start: 2020-04-09 — End: 2020-10-02

## 2020-04-18 ENCOUNTER — Encounter: Payer: Self-pay | Admitting: Foot & Ankle Surgery

## 2020-04-18 ENCOUNTER — Ambulatory Visit: Payer: 59 | Admitting: Foot & Ankle Surgery

## 2020-05-03 ENCOUNTER — Ambulatory Visit: Payer: 59 | Admitting: Foot & Ankle Surgery

## 2020-05-03 DIAGNOSIS — L84 Corns and callosities: Secondary | ICD-10-CM

## 2020-05-03 DIAGNOSIS — M21619 Bunion of unspecified foot: Secondary | ICD-10-CM

## 2020-05-03 DIAGNOSIS — E119 Type 2 diabetes mellitus without complications: Secondary | ICD-10-CM

## 2020-05-03 DIAGNOSIS — B351 Tinea unguium: Secondary | ICD-10-CM

## 2020-05-03 DIAGNOSIS — B353 Tinea pedis: Secondary | ICD-10-CM

## 2020-05-03 MED ORDER — KETOCONAZOLE 2 % EX CREA *I*
TOPICAL_CREAM | Freq: Every day | CUTANEOUS | 2 refills | Status: DC
Start: 2020-05-03 — End: 2023-09-27

## 2020-05-03 NOTE — Progress Notes (Signed)
SUBJECTIVE:     Chief Complaint   Patient presents with    Left Foot - Diabetes, New Patient Visit, Nail Problem    Right Foot - Nail Problem, New Patient Visit, Diabetes     dfc   a1c 5.6 on 11/02/18  DM X 2-3 years, BG today AM=doesnt check  Wearing boots       75 year old patient presents to clinic for diabetic foot check.  His last A1c was great at 5.6.  He has been diabetic for about 3 years.  He does not check his blood glucose at home.  He presents today wearing boots.  He has dryness of the bottom of his foot and some callusing that is forming.  His primary care doctor wanted to have him establish care as a result.  He denies any burning or tingling to his feet.  Denies any ulcerations or infections.  No history of amputation.  He does feel dryness and itching at times.  No burning or tingling.    _0   History of foot ulcer   _1   History of foot infection   _2   History of amputation   _3   History of tinea pedis   _4   History of paronychia      _5   Burning, tingling, and numbness in feet   _6   Temperature changes in feet    _7   Reproducible cramping in legs/feet with ambulation   _8   Pain with elevation of feet     LAST PCP ENCOUNTER:   Recent Visits  No visits were found meeting these conditions.  Showing recent visits within past 185 days in an active department and meeting all other requirements  Future Appointments  No visits were found meeting these conditions.  Showing future appointments within next 0 days in an active department and meeting all other requirements      MEDICATIONS:     Current Outpatient Medications   Medication    ketoconazole (NIZORAL) 2 % cream    atorvastatin (LIPITOR) 20 mg tablet    blood glucose (ONETOUCH VERIO) test strip    Lancets 30G MISC    senna (SENOKOT) 8.6 mg tablet    blood pressure monitor, automatic with arm cuff    tamsulosin (FLOMAX) 0.4 mg capsule    metFORMIN (GLUCOPHAGE) 500 MG tablet    lisinopril (PRINIVIL,ZESTRIL) 20 MG tablet    psyllium (METAMUCIL  SMOOTH TEXTURE) 28.3 % POWD powder    blood glucose monitor system    blood glucose monitor w/Device KIT    alcohol swabs pads    naproxen sodium (ANAPROX) 220 MG tablet     No current facility-administered medications for this visit.     ALLERGIES:     Allergies   Allergen Reactions    Tetanus Toxoids      On Amb Int Med note from 08/20/09    No Known Latex Allergy      PAST MEDICAL HISTORY:     Past Medical History:   Diagnosis Date    BPH (benign prostatic hypertrophy)     DM (diabetes mellitus) 10/13/2017    Esophageal reflux     Fibrolipoma     intramuscular adipose tissue    Hyperlipidemia     Hypertension     Hypoglycemia     Kidney cyst     Rotator cuff tendinitis     Right    Sexual dysfunction     absent ejaculation    Type 2 diabetes mellitus  Weight loss      PAST SURGICAL HISTORY:     Past Surgical History:   Procedure Laterality Date    KNEE SURGERY      left    LEG TENDON SURGERY      Right     FAMILY HISTORY:     Family History   Problem Relation Age of Onset    Heart Disease Mother     Diabetes Mother     Diabetes Father     Stroke Father     Cerebral Palsy Father     Breast cancer Maternal Aunt     Colon cancer Neg Hx     Esophageal cancer Neg Hx     Liver cancer Neg Hx     Pancreatic Cancer Neg Hx     Rectal cancer Neg Hx     Stomach cancer Neg Hx      SOCIAL HISTORY:     Social History     Tobacco Use    Smoking status: Never Smoker    Smokeless tobacco: Never Used   Substance Use Topics    Alcohol use: No    Drug use: No       HGBA1C:   Hemoglobin A1C   Date Value Ref Range Status   11/02/2018 5.6 % Final     Comment:     Ref Range <=5.6  HbA1c values of 5.7-6.4% indicate an increased risk for developing  diabetes mellitus.  HbA1c values greater than or equal to 6.5% are diagnostic of  diabetes mellitus.  For diagnosis of diabetes in individuals without unequivocal  hyperglycemia, results should be confirmed by repeat testing.     06/01/2018 5.7 (H) % Final      Comment:     Ref Range <=5.6  HbA1c values of 5.7-6.4% indicate an increased risk for developing  diabetes mellitus.  HbA1c values greater than or equal to 6.5% are diagnostic of  diabetes mellitus.  For diagnosis of diabetes in individuals without unequivocal  hyperglycemia, results should be confirmed by repeat testing.     10/04/2017 13.4 (H) % Final     Comment:     Ref Range <=5.6  HbA1c values of 5.7-6.4% indicate an increased risk for developing  diabetes mellitus.  HbA1c values greater than or equal to 6.5% are diagnostic of  diabetes mellitus.  For diagnosis of diabetes in individuals without unequivocal  hyperglycemia, results should be confirmed by repeat testing.     08/16/2017 9.7 (H) % Final     Comment:     Ref Range <=5.6  HbA1c values of 5.7-6.4% indicate an increased risk for developing  diabetes mellitus.  HbA1c values greater than or equal to 6.5% are diagnostic of  diabetes mellitus.  For diagnosis of diabetes in individuals without unequivocal  hyperglycemia, results should be confirmed by repeat testing.     02/24/2017 6.3 (H) % Final     Comment:     Ref Range < 5.7  HbA1c values of 5.7-6.4% indicate an increased risk for developing  diabetes mellitus.  HbA1c values greater than or equal to 6.5% are diagnostic of  diabetes mellitus.  For diagnosis of diabetes in individuals without unequivocal  hyperglycemia, results should be confirmed by repeat testing.       Hemoglobin A1C,POC   Date Value Ref Range Status   02/27/2020 5.9 (H) % Final     Comment:     Ref Range <=5.6  HbA1c values of 5.7-6.4% indicate an increased  risk for developing  diabetes mellitus.  HbA1c values greater than or equal to 6.5% are diagnostic of  diabetes mellitus.  For diagnosis of diabetes in individuals without unequivocal  hyperglycemia, results should be confirmed by repeat testing.     07/04/2019 5.7 (H) % Final     Comment:     Ref Range <=5.6  HbA1c values of 5.7-6.4% indicate an increased risk for  developing  diabetes mellitus.  HbA1c values greater than or equal to 6.5% are diagnostic of  diabetes mellitus.  For diagnosis of diabetes in individuals without unequivocal  hyperglycemia, results should be confirmed by repeat testing.     12/15/2017 7.2 (H) % Final     Comment:     Ref Range <=5.6  HbA1c values of 5.7-6.4% indicate an increased risk for developing  diabetes mellitus.  HbA1c values greater than or equal to 6.5% are diagnostic of  diabetes mellitus.  For diagnosis of diabetes in individuals without unequivocal  hyperglycemia, results should be confirmed by repeat testing.              OBJECTIVE:     Constitution: Well-developed and well-nourished. AOx3, NAD. Gait appears mildly antalgic.     Integument: Skin appears moist, warm, and supple with positive hair growth.  There are no color changes. Nails 1-5 bil are elongated, thickened, dystrophic with subungal debris appreciated.  Significant scaling noted to the plantar aspect of bilateral foot in a moccasin type distribution.  No open lesions, drainage, pus, erythema, edema, or acute SOI.  Hyperkeratotic tissue noted to the first MPJ bilaterally.    Vascular examination: DP/PT are strong bilaterally at 2/4. CFT less than 3 seconds to all digits.  There is no peripheral edema. There are no varicosities.     Right Left   Pretibial edema _0  _1    Varicosities/Telangiectasia leg and ankle _2  _3    Hyperpigmentation of the pretibial region _4  _5    Thinning and atrophy of skin _6  _7    Diminished hair growth _8  _9    Rubor of foot digits _10  _11           Neuro: Sensation is intact to level of digits with use of a 5.07 SWMF. Vibratory:  Intact. DTRs WNL with no signs of clonus appreciated     Musculoskeletal: Joints are congruous, equal and pain-free.  Muscle strength is 5/5, supinators and pronators.  Arch height appears rectus.  Mild POP to digits bil    ASSESSMENT  Encounter Diagnoses   Name Primary?    Encounter for diabetic foot exam Yes    Type 2  diabetes mellitus without complication, without long-term current use of insulin     Bunion     Callus     Onychomycosis     Tinea pedis of both feet        PLAN  Pt seen and examined  Debrided all offending nails without incident.  Recommended pt use vicks vapor rub on nails daily  Educated patient on daily foot checks and good foot hygiene.  Educated patient on neuropathic changes and pt to follow up sooner should  notice any of these problems.  Discussed need to keep blood sugars within normal limits  Prescription for ketoconazole provided for daily use  Pt to call the office if  notices any increase in redness, swelling, drainage, pus, or constitutional symptoms  Follow up in 3 months or as needed    Procedure:  Mycotic nail debridement     Mycotic nails were debrided  in length and thickness sharply with a nail nipper x 10 to include toenails 1-5 left foot and 1-5 right foot without incident secondary to high risk status to reduce risk of injury and infection. Patient tolerated procedure well.    Secondary to increased risk factors patient requires callus to be debrided in thickness to reduce the risk of injury, infection, or ulceration.  Verbal consent was obtained. Preprocedural timeout was taken.       Procedure: Hyperkeratotic tissue debridement     Hyperkeratotic tissue in total 1 to bilateral  to include first and BJ bilaterally was debrided sharply without incident with a 15 blade to reduce the risk of ulceration.  Patient tolerated procedure well without any immediate adverse event.

## 2020-05-22 ENCOUNTER — Ambulatory Visit: Payer: 59 | Admitting: Optometry

## 2020-05-22 ENCOUNTER — Encounter: Payer: Self-pay | Admitting: Optometry

## 2020-05-22 DIAGNOSIS — H40003 Preglaucoma, unspecified, bilateral: Secondary | ICD-10-CM

## 2020-05-22 DIAGNOSIS — H524 Presbyopia: Secondary | ICD-10-CM

## 2020-05-22 DIAGNOSIS — H43813 Vitreous degeneration, bilateral: Secondary | ICD-10-CM

## 2020-05-22 DIAGNOSIS — H04123 Dry eye syndrome of bilateral lacrimal glands: Secondary | ICD-10-CM

## 2020-05-22 DIAGNOSIS — H2513 Age-related nuclear cataract, bilateral: Secondary | ICD-10-CM

## 2020-05-22 DIAGNOSIS — E119 Type 2 diabetes mellitus without complications: Secondary | ICD-10-CM

## 2020-05-22 DIAGNOSIS — H52203 Unspecified astigmatism, bilateral: Secondary | ICD-10-CM

## 2020-05-22 NOTE — Patient Instructions (Signed)
Call or return to clinic as needed if notices changes in vision, loss of vision, new ocular symptoms or worsening of symptoms.    Patient educated to use artificial tears such as refresh, blink, systane or soothe XP in both eyes as needed up to four times per day. If using >4x/day recommend preservative free artificial tears.    Can continue saline to wash dust out of eyes as needed after work

## 2020-05-23 NOTE — Progress Notes (Signed)
Outpatient Visit      Patient name: Tristan Mcbride  DOB: 1945/11/21       Age: 75 y.o.  MR#: [id number]    Encounter Date: 05/22/2020    Subjective:      Chief Complaint   Patient presents with    Follow-up     HPI     Annual Exam, OCT RNFL OU/LEE 10 mo  Hx: glaucoma susp OU, NS OU, DES  Pt states he got new glasses, did not bring today, but says they work   well. He wears to drive and to read.  No c/o eye pain, pt says he uses eye drops prn for comfort, or just washes   his eyes.         Type 2 DM, A1C 5.9 02/2020, per patient good control    Last edited by Charyl Bigger, OD on 05/22/2020 10:50 AM. (History)        has a current medication list which includes the following prescription(s): ketoconazole, atorvastatin, onetouch verio, lancets 30g, senna, blood pressure monitor, automatic with arm cuff, tamsulosin, metformin, lisinopril, metamucil smooth texture, blood glucose monitor, onetouch verio, alcohol swabs, [DISCONTINUED] insulin glargine, and naproxen sodium.     is allergic to tetanus toxoids and no known latex allergy.      Past Medical History:   Diagnosis Date    BPH (benign prostatic hypertrophy)     DM (diabetes mellitus) 10/13/2017    Esophageal reflux     Fibrolipoma     intramuscular adipose tissue    Hyperlipidemia     Hypertension     Hypoglycemia     Kidney cyst     Rotator cuff tendinitis     Right    Sexual dysfunction     absent ejaculation    Type 2 diabetes mellitus     Weight loss       Past Surgical History:   Procedure Laterality Date    KNEE SURGERY      left    LEG TENDON SURGERY      Right        Specialty Problems        Ophthalmology Problems    DM (diabetes mellitus)               ROS     Positive for: Eyes    Negative for: Constitutional, Gastrointestinal, Neurological, Skin,   Genitourinary, Musculoskeletal, HENT, Endocrine, Cardiovascular,   Respiratory, Psychiatric, Allergic/Imm, Heme/Lymph    Last edited by Charyl Bigger, OD on 05/22/2020 10:50 AM. (History)          Objective:     Base Eye Exam     Visual Acuity (Snellen - Linear)       Right Left    Dist cc 20/40 20/40 -1    Dist ph cc NI NI    Correction: Glasses   Checked [facility] with last final Rx in phoropter. ("wearing Rx")           Tonometry (Tonopen, 10:43 AM)       Right Left    Pressure 15 15          Pupils       Pupils APD    Right PERRLA None    Left PERRLA None          Neuro/Psych     Oriented x3: Yes    Mood/Affect: Normal          Dilation  Both eyes: 2.5% Phenylephrine, 1.0% Tropicamide @ 10:43 AM            Slit Lamp and Fundus Exam     External Exam       Right Left    External Normal ocular adnexae, lacrimal gland & drainage, orbits Normal ocular adnexae, lacrimal gland & drainage, orbits          Slit Lamp Exam       Right Left    Lids/Lashes 1+ Meibomian gland dysfunction 1+ Meibomian gland dysfunction    Conjunctiva/Sclera Conjunctivochalasis, trace melanosis trace melanosis, temp pinguecula,, Conjunctivochalasis    Cornea trace reduced TBUT slightly irregular tear film, inferior nasal trace PEE    Anterior Chamber Clear & deep Clear & deep    Iris Normal shape, size, morphology Normal shape, size, morphology    Lens 2+ Nuclear sclerosis, few posterior vacoules 2+ Nuclear sclerosis, trace posterior vacoules    Vitreous Posterior vitreous detachment Posterior vitreous detachment          Fundus Exam       Right Left    Disc Normal size, appearance, nerve fiber layer Normal size, appearance, nerve fiber layer    C/D Ratio .60V/.7H .65V/.7H    Macula Normal, trace ERM Normal, trace inf ERM    Vessels Normal (-) DR OU Normal    Periphery Normal, few peripheral floaters Normal, peripheral floaters            Refraction     Wearing Rx       Sphere Cylinder Axis Add    Right -0.25 -0.25 163 +2.25    Left [place] -0.50 057 +2.25          Manifest Refraction       Sphere Cylinder Axis Dist [facility] Add [place]    Right -0.75 -0.25 163 20/25-2 +2.25 J1+    Left -0.25 -0.50 057 20/30+1 +2.25 OU                        No  annotated images are attached to the encounter.      Assessment/Plan:      1. Glaucoma suspect of both eyes  OCT, RNFL-OU   2. Type 2 diabetes mellitus without retinopathy     3. Nuclear sclerosis of both eyes     4. Dry eye syndrome of both eyes     5. Astigmatism of both eyes with presbyopia     6. Vitreous degeneration of both eyes          PLAN:    1. Glaucoma suspect vs. Physiologic cupping OU, ?progression comparing 2008 photos and 2019 photos, although rim tissue appears healthy today and is stable to 2020 dilated exam therefore is likely not progressing at this time.   - IOP:  Normal IOP, 15/15 OD/OS today-stable  I personally ordered and reviewed the OCT today which showed       OCT:  Within normal limits, without signs of thinning, good avg rnfl, stable to 2020  - last HVF from 2012 appeared normal  -pachymetry is normal  - Return for IOP/follow up in 6 months    2. The patient is without signs of diabetic retinopathy or diabetic macular edema. I personally reviewed the last PCP visit from 02/2020 which showed patient is doing well with control and is to continue his metformin. The patient was counseled to report any visual changes, to regularly check serum glucose levels as directed, to  optimize diet, to take prescribed medications, to keep scheduled appointments, and to optimize blood glucose control with HgA1C less than 7 or as primary physician or endocrinologist has specified.  Communication will be forwarded to the responsible physician managing the patient's diabetes.  Follow up has been arranged for 1 year.    3. Patient ed on cataracts, educated appears slightly visually significant and is most consistent with his vision, but surgical extraction not warranted at this time, patient okay to wait for surgery at this time and happy with vision in new glasses, will consider in the future, monitor in 6 months    4. Discussed findings of mild dry eyes in both eyes. Patient reports previously he gets a  lot of dust in his eyes after work. Educated on the following treatment plan:  -Patient educated to use artificial tears such as refresh, blink, systane or soothe XP in both eyes as needed up to four times per day. If using >4x/day recommend preservative free artificial tears.  -Can continue saline to wash dust out of eyes as needed after work  -Call or return to clinic as needed if notices changes in vision, loss of vision, new ocular symptoms or worsening of symptoms.    5. Vision is stable with slight adjustment in glasses, however patient happy with current ones, recommend continuing to use these for now    6. Patient was educated that they have vitreous floaters consistent with a posterior vitreous detachment in both eyes. There were no retinal holes or tears seen on dilated examination. Signs and symptoms of retinal detachment were reviewed (new/worsening flashes, floaters, loss of vision, curtain, etc.) and the patient will call for any changes. Unless changes noted, will monitor annually or sooner as needed.    RTC 6 months for IOP check, BAT, MRX check and dry eye follow up

## 2020-07-17 ENCOUNTER — Other Ambulatory Visit: Payer: Self-pay | Admitting: Internal Medicine

## 2020-07-17 DIAGNOSIS — I10 Essential (primary) hypertension: Secondary | ICD-10-CM

## 2020-07-17 MED ORDER — LISINOPRIL 20 MG PO TABS *I*
20.0000 mg | ORAL_TABLET | Freq: Every day | ORAL | 0 refills | Status: DC
Start: 2020-07-17 — End: 2020-10-02

## 2020-07-17 NOTE — Telephone Encounter (Signed)
Medication request routed to Dr Cox for processing, as Dr Harold Hedge is currently unavailable 07/17/2020 @2 :56 PM

## 2020-08-02 ENCOUNTER — Ambulatory Visit: Payer: 59 | Admitting: Foot & Ankle Surgery

## 2020-08-08 ENCOUNTER — Other Ambulatory Visit: Payer: Self-pay | Admitting: Internal Medicine

## 2020-08-08 DIAGNOSIS — N400 Enlarged prostate without lower urinary tract symptoms: Secondary | ICD-10-CM

## 2020-08-08 MED ORDER — TAMSULOSIN HCL 0.4 MG PO CAPS *I*
0.4000 mg | ORAL_CAPSULE | Freq: Every evening | ORAL | 1 refills | Status: DC
Start: 2020-08-08 — End: 2020-10-02

## 2020-09-01 NOTE — Progress Notes (Deleted)
[facility] Office Note:    Subjective   This visit was conducted with the aid of an in-person Spanish interpreter. Tristan Mcbride is a 75 y.o. M with history of HTN, T2DM, BPH, and GERD who presents today with his wife for diabetes follow-up. The patient was last seen in our clinic in November 2021, at which time he was feeling well.     Medications & Allergies     Current Outpatient Medications   Medication Sig   • tamsulosin (FLOMAX) 0.4 mg capsule Take 1 capsule (0.4 mg total) by mouth every evening   • lisinopril (PRINIVIL,ZESTRIL) 20 mg tablet Take 1 tablet (20 mg total) by mouth daily   • ketoconazole (NIZORAL) 2 % cream Apply topically daily   • atorvastatin (LIPITOR) 20 mg tablet Take 1 tablet (20 mg total) by mouth daily (with dinner)   • blood glucose (ONETOUCH VERIO) test strip TEST twice a day   • Lancets 30G MISC Use as directed. Dispense lowest cost lancet based on patient's insurance.   • senna (SENOKOT) 8.6 mg tablet Take 2 tablets by mouth daily   • blood pressure monitor, automatic with arm cuff Use as directed to monitor blood pressure   • metFORMIN (GLUCOPHAGE) 500 MG tablet take 1 tablet by mouth twice a day with meals   • psyllium (METAMUCIL SMOOTH TEXTURE) 28.3 % POWD powder 1 tablespoon in 8 oz of water daily by mouth. (Patient not taking: Reported on 10/25/2019)   • blood glucose monitor system Use as directed to test blood glucose.   • blood glucose monitor w/Device KIT Test as directed   • alcohol swabs pads Use as directed to test blood sugar twice daily.   • naproxen sodium (ANAPROX) 220 MG tablet Take 220 mg by mouth as needed (Patient not taking: Reported on 02/27/2020)        He is allergic to tetanus toxoids and no known latex allergy.    Past Medical & Surgical History     Past Medical History:   Diagnosis Date   • BPH (benign prostatic hypertrophy)    • DM (diabetes mellitus) 10/13/2017   • Esophageal reflux    • Fibrolipoma     intramuscular adipose tissue   •  Hyperlipidemia    • Hypertension    • Hypoglycemia    • Kidney cyst    • Rotator cuff tendinitis     Right   • Sexual dysfunction     absent ejaculation   • Type 2 diabetes mellitus    • Weight loss      Past Surgical History:   Procedure Laterality Date   • KNEE SURGERY      left   • LEG TENDON SURGERY      Right     Social & Family History     Family History   Problem Relation Age of Onset   • Heart Disease Mother    • Diabetes Mother    • Diabetes Father    • Stroke Father    • Cerebral Palsy Father    • Breast cancer Maternal Aunt    • Colon cancer Neg Hx    • Esophageal cancer Neg Hx    • Liver cancer Neg Hx    • Pancreatic Cancer Neg Hx    • Rectal cancer Neg Hx    • Stomach cancer Neg Hx      Social History     Socioeconomic History   •   Socioeconomic History    Marital status: Married     Spouse name: Not on file    Number of children: Not on file    Years of education: Not on file    Highest education level: Not on file   Occupational History    Not on file   Tobacco Use    Smoking status: Never Smoker    Smokeless tobacco: Never Used   Substance and Sexual Activity    Alcohol use: No    Drug use: No    Sexual activity: Yes     Partners: Female   Social History Narrative    Not on file     Vitals & Physical Exam     Current Vitals Vitals Range (24 Hours)   There were no vitals taken for this visit. BP: ()/()   Arterial Line BP: ()/()      Physical Exam:  General: Patient sitting comfortably in chair. Calm, cooperative, in no acute distress.  HEENT: NC/AT, anicteric, mucous membranes moist without exudate or lesions.  Neck: No cervical or supraclavicular lymphadenopathy, no JVD.  Cardiovascular: RRR, no m/r/g, normal S1 and S2.  Pulmonary: NWOB, CTAB.  Abdominal: Soft, NTND, +ve BS.  Neuro: Alert and oriented x3. Speech is fluent, comprehension is intact.   Extremities: Warm, dry, 2+ radial and dorsalis pedis pulses, no edema.     [place] is a 75 y.o. male with history of HTN, T2DM, BPH, and GERD who  presents today for diabetes follow-up.    #Type 2 Diabetes Mellitus  -Last A1c 5.9% in November 2021. Repeat A1c today:***   -Continue with metformin 500 mg bid.  -Urine microalbumin/creatinine last checked November 2021.   -Continue with ACE inhibitor, as below.  -Follows with Ophthalmology. Last appointment April 2021.  -Diabetic Foot Exam completed February 2022.  -Follows with Podiatry, last appointment in January 2022.    #Hypertension, #At-Risk for Coronary Artery Disease  -Blood pressure remains at goal.  -Continue with lisinopril 20 mg daily.  -Consider increasing atorvastatin to 40 mg daily.    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 Repeat scan in August 2021, unchanged risk assessment.  -Continue with tamsulosin 0.4 mg nightly.  -Follows with Urology.    Follow-Ups   Ophthalmology - November 20, 2020    Health Maintenance   Abdominal Aortic Aneurysm (one-time screening in men 65-75 with history of tobacco use) - N/A  Breast Cancer (biennial screening in women 50-74) - N/A  Cervical Cancer (Pap smear every 3 years in women 21-65) - N/A  Colorectal Cancer (colonoscopy every 10 years in adults 45-75) - Completed November 2016, encouraged to reschedule.  Diabetes (annual A1c in overweight or adults >45) - A1c rechecked today.  Immunizations - IMM-Prevnar, IMM-Zoster  Lipid Disorder (annual screening in adults with increased CHD risk) - Last completed November 2021.  Lung Cancer (annual screening in adults 50-80 with 20 pack-year history and active smoking within 15 years) -   Osteoporosis (one-time screening in women >65) -     Orders Placed     Orders Placed This Encounter   No orders placed during this encounter.     Liliane Channel, MD   Signed 09/01/2020 at 6:04 PM

## 2020-09-02 ENCOUNTER — Ambulatory Visit: Payer: 59 | Admitting: Internal Medicine

## 2020-09-04 ENCOUNTER — Telehealth: Payer: Self-pay | Admitting: Internal Medicine

## 2020-09-04 NOTE — Telephone Encounter (Signed)
Copied from [place] #[id number]. Topic: Appointments - Schedule Appointment  >> Sep 04, 2020  8:48 AM Konrad Dolores wrote:  [name], patient's spouse, called to schedule follow up visit for patient.   She states patient will be back from [place] towards the end of June, so won't be available until approximately 6/25.   Writer attempted to schedule with team CCP, but they finish up a rotation in office that week.   She was agreeable to schedule with nurse instead, but writer was unable to schedule with NP Lattie Haw    Please call [name] back with interpreter to schedule follow up for patient.

## 2020-09-17 NOTE — Telephone Encounter (Signed)
Patient has appointment scheduled for 10/02/20

## 2020-10-02 ENCOUNTER — Ambulatory Visit: Payer: 59 | Admitting: Student in an Organized Health Care Education/Training Program

## 2020-10-02 ENCOUNTER — Other Ambulatory Visit
Admission: RE | Admit: 2020-10-02 | Discharge: 2020-10-02 | Disposition: A | Discharge status: Home / Self Care | Payer: 59 | Source: Ambulatory Visit | Attending: Internal Medicine | Admitting: Internal Medicine

## 2020-10-02 ENCOUNTER — Encounter: Payer: Self-pay | Admitting: Student in an Organized Health Care Education/Training Program

## 2020-10-02 VITALS — BP 136/84 | HR 76 | Temp 99.0°F | Ht 67.0 in | Wt 151.8 lb

## 2020-10-02 DIAGNOSIS — I10 Essential (primary) hypertension: Secondary | ICD-10-CM

## 2020-10-02 DIAGNOSIS — E119 Type 2 diabetes mellitus without complications: Secondary | ICD-10-CM

## 2020-10-02 DIAGNOSIS — Z794 Long term (current) use of insulin: Secondary | ICD-10-CM

## 2020-10-02 DIAGNOSIS — N400 Enlarged prostate without lower urinary tract symptoms: Secondary | ICD-10-CM

## 2020-10-02 DIAGNOSIS — Z23 Encounter for immunization: Secondary | ICD-10-CM

## 2020-10-02 DIAGNOSIS — E785 Hyperlipidemia, unspecified: Secondary | ICD-10-CM

## 2020-10-02 DIAGNOSIS — M1712 Unilateral primary osteoarthritis, left knee: Secondary | ICD-10-CM

## 2020-10-02 LAB — HEMOGLOBIN A1C: Hemoglobin A1C: 6.1 % — ABNORMAL HIGH

## 2020-10-02 LAB — BASIC METABOLIC PANEL
Anion Gap: 10 (ref 7–16)
CO2: 28 mmol/L (ref 20–28)
Calcium: 10.1 mg/dL (ref 8.6–10.2)
Chloride: 105 mmol/L (ref 96–108)
Creatinine: 1.33 mg/dL — ABNORMAL HIGH (ref 0.67–1.17)
Glucose: 89 mg/dL (ref 60–99)
Lab: 25 mg/dL — ABNORMAL HIGH (ref 6–20)
Potassium: 4 mmol/L (ref 3.3–5.1)
Sodium: 143 mmol/L (ref 133–145)
eGFR BY CREAT: 57 * — AB

## 2020-10-02 MED ORDER — TAMSULOSIN HCL 0.4 MG PO CAPS *I*
0.4000 mg | ORAL_CAPSULE | Freq: Every evening | ORAL | 1 refills | Status: DC
Start: 2020-10-02 — End: 2021-09-20

## 2020-10-02 MED ORDER — LISINOPRIL 20 MG PO TABS *I*
20.0000 mg | ORAL_TABLET | Freq: Every day | ORAL | 0 refills | Status: DC
Start: 2020-10-02 — End: 2021-06-11

## 2020-10-02 MED ORDER — ATORVASTATIN CALCIUM 20 MG PO TABS *I*
20.0000 mg | ORAL_TABLET | Freq: Every day | ORAL | 1 refills | Status: DC
Start: 2020-10-02 — End: 2021-06-11

## 2020-10-02 MED ORDER — METFORMIN HCL 500 MG PO TABS *I*
500.0000 mg | ORAL_TABLET | Freq: Two times a day (BID) | ORAL | 3 refills | Status: DC
Start: 2020-10-02 — End: 2022-01-29

## 2020-10-02 MED ORDER — NAPROXEN 250 MG PO TABS *I*
250.0000 mg | ORAL_TABLET | Freq: Two times a day (BID) | ORAL | 0 refills | Status: AC
Start: 2020-10-02 — End: 2020-10-12

## 2020-10-02 NOTE — Progress Notes (Signed)
[facility] Progress Note    Reason For Visit: No chief complaint on file.    Subjective   Tristan Mcbride is 75 y.o. male with medical history of HTN, DM, BPH presenting for follow up as below.    L knee pain - he has chronic L knee pain; had it replaced in 1987. Feels like it has been getting worse. Hurts more when not using it, for example when sitting a long time in the car. Has been worse for 3-4 months. Not sure why. He is still working - does sautering for work. Doesn't do anything for pain. Sometimes takes aleve when the pain is worse - takes it about two days a week.     T2DM - feels his diet has been good and has been avoiding sugary foods.    HTN - does not take BP at home    ROS as above    Medications reviewed and reconciled.   Allergies reviewed and reconciled   Objective     Vitals:    10/02/20 0804   BP: 136/84   Pulse: 76   Temp: 37.2 C (99 F)   SpO2: 95%   Weight: 68.9 kg (151 lb 12.8 oz)   Height: 1.702 m (5\' 7" )       Physical Exam   Gen: Appears well; good hygiene  HEENT: Mask in place  Cardiac: Regular rate  Pulm: Effort normal  Neuro: alert and oriented; converses appropriately  Psych: appropriate mood  Ext: knee exam - scar over left patella; good mobility of patella without tenderness; neg anterior/posterior drawer, meniscal testing, and MCL/LCL testing      Laboratory Reviewed. Pertinent results as follows:  - lipid panel in 02/2020    Imaging Reviewed. Pertinent results as follows:  - n/a    Assessment   Tristan Mcbride is 75 y.o. male with medical history of HTN, DM, BPH presenting for follow up as below.      Plan   Active and discussed issues:  1. Osteoarthritis of left knee, unspecified osteoarthritis type  - patient interested in trying short course of NSAIDs, though discussed this is not a long term option  - naproxen bid for 10 days  - discussed PT and steroid joint injections as other options; patient chose to start with above first    2. Hyperlipidemia, unspecified  hyperlipidemia type  - atorvastatin (LIPITOR) 20 mg tablet; Take 1 tablet (20 mg total) by mouth daily (with dinner)  Dispense: 90 tablet; Refill: 1    3. Type 2 diabetes mellitus without complication, with long-term current use of insulin  - metFORMIN (GLUCOPHAGE) 500 mg tablet; Take 1 tablet (500 mg total) by mouth 2 times daily (with meals)  Dispense: 360 tablet; Refill: 3  - Basic metabolic panel; Future  - Hemoglobin A1c; Future    4. Hypertension, unspecified type  - goal <130/80; near goal today  - continue with below  - lisinopril (PRINIVIL,ZESTRIL) 20 mg tablet; Take 1 tablet (20 mg total) by mouth daily  Dispense: 180 tablet; Refill: 0    5. Benign prostatic hyperplasia, unspecified whether lower urinary tract symptoms present  - encouraged FU with Urology as they are following an elevated PSA and last visit was a year ago  - tamsulosin (FLOMAX) 0.4 mg capsule; Take 1 capsule (0.4 mg total) by mouth every evening  Dispense: 90 capsule; Refill: 1      Orders Placed This Encounter    Pneumococcal 20-valent conj vaccine  Basic metabolic panel    Hemoglobin A1c    atorvastatin (LIPITOR) 20 mg tablet    metFORMIN (GLUCOPHAGE) 500 mg tablet    lisinopril (PRINIVIL,ZESTRIL) 20 mg tablet    tamsulosin (FLOMAX) 0.4 mg capsule    naproxen (NAPROSYN) 250 mg tablet       Follow up interval/topics:  - knee pain    Marrian Salvage, MD   Internal Medicine PGY3  10/02/2020 8:09 AM

## 2020-10-02 NOTE — Addendum Note (Signed)
Addended by: Mariana Single on: 10/02/2020 09:19 AM     Modules accepted: Level of Service

## 2020-11-20 ENCOUNTER — Ambulatory Visit: Payer: 59 | Admitting: Optometry

## 2021-01-14 NOTE — Progress Notes (Signed)
[facility] Office Note:    Subjective   This visit was conducted with an in-person Spanish interpreter. Tristan Mcbride is a 75 y.o. male with history of HTN, HLD, T2DM, BPH, and GERD who presents today with his wife for follow-up. The patient was last seen in our clinic in June 2022, at which time he was evaluated for left knee pain secondary to osteoarthritis. He was prescribed a short course of naproxen to good effect, with plan for Physical Therapy evaluation if persistent. Since he was last seen, the patient endorses some improvement in left-sided knee pain, though does note that it is still present. Pain primarily affects the anterior knee and is described as a dull, aching sensation. He equates this to tightness, and feels that this may be related to a remote surgical intervention approximately 30 years ago. He notes that pain is most severe when he is laying down in bed, though improves with stretching and movement. He is a Building control surveyor, and does not notice worsening of pain when he is working or sitting down. He denies any sensory deficit or change. He has had no difficulty with ambulation or concern for falls. He has not been to Physical Therapy before, and is concerned that this may interfere with his work schedule.    Medications & Allergies     Current Outpatient Medications   Medication Sig    atorvastatin (LIPITOR) 20 mg tablet Take 1 tablet (20 mg total) by mouth daily (with dinner)    metFORMIN (GLUCOPHAGE) 500 mg tablet Take 1 tablet (500 mg total) by mouth 2 times daily (with meals)    lisinopril (PRINIVIL,ZESTRIL) 20 mg tablet Take 1 tablet (20 mg total) by mouth daily    tamsulosin (FLOMAX) 0.4 mg capsule Take 1 capsule (0.4 mg total) by mouth every evening    acetaminophen (TYLENOL) 500 mg tablet Take 1 tablet (500 mg total) by mouth 3 times daily as needed for Pain    diclofenac (VOLTAREN) 1 % gel Apply 4 g topically 4 times daily  to the following areas: Knees    ketoconazole  (NIZORAL) 2 % cream Apply topically daily    blood glucose (ONETOUCH VERIO) test strip TEST twice a day (Patient not taking: Reported on 01/15/2021)    Lancets 30G MISC Use as directed. Dispense lowest cost lancet based on patient's insurance. (Patient not taking: Reported on 01/15/2021)    senna (SENOKOT) 8.6 mg tablet Take 2 tablets by mouth daily (Patient not taking: Reported on 01/15/2021)    blood pressure monitor, automatic with arm cuff Use as directed to monitor blood pressure (Patient not taking: Reported on 01/15/2021)    psyllium (METAMUCIL SMOOTH TEXTURE) 28.3 % POWD powder 1 tablespoon in 8 oz of water daily by mouth. (Patient not taking: Reported on 10/25/2019)    blood glucose monitor system Use as directed to test blood glucose. (Patient not taking: Reported on 01/15/2021)    blood glucose monitor w/Device KIT Test as directed (Patient not taking: Reported on 01/15/2021)    alcohol swabs pads Use as directed to test blood sugar twice daily. (Patient not taking: Reported on 01/15/2021)    naproxen sodium (ANAPROX) 220 MG tablet Take 220 mg by mouth as needed (Patient not taking: Reported on 02/27/2020)        He is allergic to tetanus toxoids and no known latex allergy.    Past Medical & Surgical History     Past Medical History:   Diagnosis Date  BPH (benign prostatic hypertrophy)     DM (diabetes mellitus) 10/13/2017    Esophageal reflux     Fibrolipoma     intramuscular adipose tissue    Hyperlipidemia     Hypertension     Hypoglycemia     Kidney cyst     Rotator cuff tendinitis     Right    Sexual dysfunction     absent ejaculation    Type 2 diabetes mellitus     Weight loss      Past Surgical History:   Procedure Laterality Date    KNEE SURGERY      left    LEG TENDON SURGERY      Right     Social & Family History     Family History   Problem Relation Age of Onset    Heart Disease Mother     Diabetes Mother     Diabetes Father     Stroke Father     Cerebral Palsy Father      Breast cancer Maternal Aunt     Colon cancer Neg Hx     Esophageal cancer Neg Hx     Liver cancer Neg Hx     Pancreatic Cancer Neg Hx     Rectal cancer Neg Hx     Stomach cancer Neg Hx      Social History     Socioeconomic History    Marital status: Married     Spouse name: Not on file    Number of children: Not on file    Years of education: Not on file    Highest education level: Not on file   Occupational History    Not on file   Tobacco Use    Smoking status: Never Smoker    Smokeless tobacco: Never Used   Substance and Sexual Activity    Alcohol use: No    Drug use: No    Sexual activity: Yes     Partners: Female   Social History Narrative    Not on file     Vitals & Physical Exam     Current Vitals Vitals Range (24 Hours)   BP 132/61    Pulse 71    Temp 36.2 C (97.2 F) (Temporal)    Ht 1.702 m (5' 7") Comment: stated   Wt 68.5 kg (151 lb)    SpO2 98%    BMI 23.65 kg/m  Temp:  [36.2 C (97.2 F)] 36.2 C (97.2 F)  Heart Rate:  [71] 71  BP: (132)/(61) 132/61     Physical Exam:  General: Patient sitting comfortably in chair. Calm, cooperative, in no acute distress.  Musculoskeletal: Anterior & posterior drawer signs, varus & valgus stress tests, Thesally & McMurray maneuvers negative. No evidence of locking or catching. Strength intact. Mild tenderness to palpation at patella.   Extremities: WWP without peripheral edema.     Assessment & Plan   Tristan Mcbride is a 75 y.o. male with history of HTN, T2DM, BPH, and GERD who presents today for follow-up regarding left-sided knee pain. Differential diagnosis is broad, and includes osteoarthritis, patellofemoral pain syndrome, pes anserine syndrome, patellar tendinitis, or bursitis. Examination was reassuring against cruciate ligamentous or meniscal injury. Will check bilateral knee plain films and refer to Physical Therapy for presumptive diagnosis of osteoarthritis.    #LLeft Knee Osteoarthritis  -Schedule Tylenol 1000 mg tid & prescribe Voltaren  gel.  -Plain films of bilateral lower extremity.  -Ambulatory Referral to Physical  Therapy.    #Type 2 Diabetes Mellitus  -Last A1c 6.1% in June 2022. Repeat today 6.1%.  -Continue with metformin 500 mg bid.  -Urine microalbumin/creatinine completed November 2021.  -Follows with Ophthalmology. Last appointment February 2022.  -Follows with Podiatry. Last appointment January 2022.    #Hypertension  -Blood pressure within acceptable range.  -Continue with lisinopril 20 mg daily.    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 Repeat scan in August 2021, unchanged risk assessment.  -Continue with tamsulosin 0.4 mg nightly.  -Follows with Urology.    Health Maintenance   Abdominal Aortic Aneurysm (one-time screening in men 65-75 with history of tobacco use) - N/A.  Breast Cancer (biennial screening in women 50-74) - N/A  Cervical Cancer (Pap smear every 3 years in women 21-65) - N/A  Colorectal Cancer (colonoscopy every 10 years in adults 49-75) - Deferred.  Diabetes (annual A1c in overweight or adults >45) - Will recheck today.  Immunizations - Influenza, Zoster, Prevnar. Patient will get flu shot today.  Lipid Disorder (annual screening in adults with increased CHD risk) - Will recheck in future.  Lung Cancer (annual screening in adults 50-80 with 20 pack-year history and active smoking within 15 years) - N/A  Osteoporosis (one-time screening in women >63) - N/A    Orders Placed     Orders Placed This Encounter    * Knee LEFT standard AP, Lateral, Patellar views    * Knee RIGHT standard AP, Lateral, Patellar views    Flu Vaccine, High Dose Quad    AMB REFERRAL TO PHYSICAL / OCCUPATIONAL THERAPY    POCT HEMOGLOBIN A1C    acetaminophen (TYLENOL) 500 mg tablet    diclofenac (VOLTAREN) 1 % gel     Liliane Channel, MD   Signed 01/15/2021 at 10:20 AM

## 2021-01-15 ENCOUNTER — Encounter: Payer: Self-pay | Admitting: Internal Medicine

## 2021-01-15 ENCOUNTER — Other Ambulatory Visit: Payer: Self-pay

## 2021-01-15 ENCOUNTER — Ambulatory Visit: Payer: 59 | Attending: Student in an Organized Health Care Education/Training Program | Admitting: Internal Medicine

## 2021-01-15 VITALS — BP 132/61 | HR 71 | Temp 97.2°F | Ht 67.0 in | Wt 151.0 lb

## 2021-01-15 DIAGNOSIS — Z23 Encounter for immunization: Secondary | ICD-10-CM | POA: Insufficient documentation

## 2021-01-15 DIAGNOSIS — M25562 Pain in left knee: Secondary | ICD-10-CM | POA: Insufficient documentation

## 2021-01-15 LAB — POCT GLUCOSE: Glucose POCT: 74 mg/dL (ref 60–99)

## 2021-01-15 LAB — POCT HEMOGLOBIN A1C: Hemoglobin A1C,POC: 6.1 % — ABNORMAL HIGH

## 2021-01-15 MED ORDER — ACETAMINOPHEN 500 MG PO TABS *I*
500.0000 mg | ORAL_TABLET | Freq: Three times a day (TID) | ORAL | 0 refills | Status: AC | PRN
Start: 2021-01-15 — End: 2021-02-14

## 2021-01-15 MED ORDER — DICLOFENAC SODIUM 1 % EX GEL *I*
4.0000 g | Freq: Four times a day (QID) | CUTANEOUS | 0 refills | Status: AC
Start: 2021-01-15 — End: 2021-02-14

## 2021-01-15 NOTE — Patient Instructions (Signed)
Lo refiero a fisioterapia por su dolor de rodilla. Ellos te llamarn para programar esto. 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 Puede tomar Tylenol 1000 mg tres veces al da segn sea necesario para Conservation officer, historic buildings. Tambin puedes probar el gel Voltaren (tpico) en las rodillas. Estoy ordenando radiografas de sus rodillas. Por favor, hgalos a su conveniencia.

## 2021-02-25 ENCOUNTER — Ambulatory Visit: Payer: 59 | Admitting: Rehabilitative and Restorative Service Providers"

## 2021-05-06 NOTE — Progress Notes (Signed)
[facility] Office Note:    Subjective   This visit was conducted with an in-person Spanish interpreter.Tristan Mcbride is a 76 y.o. male with history of HTN, HLD, T2DM, BPH, and GERD who presents todaywith his wifefor follow-up. The patient was last seen in our clinic in October 2022, at which time he was evaluated for left knee pain secondary to osteoarthritis. He was intended to undergo plain films of the bilateral lower extremity, though unfortunately was not able to get these scans done. He was also intended to follow-up with Physical Therapy, though did not make it to this appointment. He notes that pain has substantially improved with diclofenac gel.    Since he was last seen in our clinic, Tristan Mcbride states that he has developed a hearing problem. He states that he has had difficulty hearing through his right ear for his whole life, since he was a child. He is now starting to develop hearing problems in the left ear, with diminished auditory sensation. He notices that his wife has had to speak much louder, and that he has had to increase the volume on the television to hear the same way as he did before. He is unsure of the chronicity of this symptom, but approximates this to one year. He does have associated ringing in his left ear, intermittently. He cannot identify any inciting factors. He denies any ear pain, itching, or discharge. 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 Review of systems was negative for difficulty chewing or swallowing, though he does sometimes have pain radiating to the ipsilateral neck.    With regard to his diabetes, he states that he has had consistent follow-up with Ophthalmology & Podiatry. He is reluctant to go back to the Podiatrist, as he did not enjoy callus removal. He denies any issues with his feet.    Medications & Allergies     Current Outpatient Medications   Medication Sig    atorvastatin (LIPITOR) 20 mg tablet Take 1 tablet (20  mg total) by mouth daily (with dinner)    metFORMIN (GLUCOPHAGE) 500 mg tablet Take 1 tablet (500 mg total) by mouth 2 times daily (with meals)    lisinopril (PRINIVIL,ZESTRIL) 20 mg tablet Take 1 tablet (20 mg total) by mouth daily    tamsulosin (FLOMAX) 0.4 mg capsule Take 1 capsule (0.4 mg total) by mouth every evening    ketoconazole (NIZORAL) 2 % cream Apply topically daily    senna (SENOKOT) 8.6 mg tablet Take 2 tablets by mouth daily    naproxen sodium (ANAPROX) 220 MG tablet Take 220 mg by mouth as needed  for Pain    carbamide peroxide (DEBROX) 6.5 % otic solution Place 5 drops into the right ear 2 times daily for 14 days    blood glucose (ONETOUCH VERIO) test strip TEST twice a day (Patient not taking: No sig reported)    Lancets 30G MISC Use as directed. Dispense lowest cost lancet based on patient's insurance. (Patient not taking: No sig reported)    blood pressure monitor, automatic with arm cuff Use as directed to monitor blood pressure (Patient not taking: No sig reported)    psyllium (METAMUCIL SMOOTH TEXTURE) 28.3 % POWD powder 1 tablespoon in 8 oz of water daily by mouth. (Patient not taking: No sig reported)    blood glucose monitor system Use as directed to test blood glucose. (Patient not taking: No sig reported)    blood glucose monitor w/Device  KIT Test as directed (Patient not taking: No sig reported)    alcohol swabs pads Use as directed to test blood sugar twice daily. (Patient not taking: No sig reported)        He is allergic to tetanus toxoids and no known latex allergy.     Past Medical & Surgical History     Past Medical History:   Diagnosis Date    BPH (benign prostatic hypertrophy)     DM (diabetes mellitus) 10/13/2017    Esophageal reflux     Fibrolipoma     intramuscular adipose tissue    Hyperlipidemia     Hypertension     Hypoglycemia     Kidney cyst     Rotator cuff tendinitis     Right    Sexual dysfunction     absent ejaculation    Type 2 diabetes  mellitus     Weight loss      Past Surgical History:   Procedure Laterality Date    KNEE SURGERY      left    LEG TENDON SURGERY      Right     Social & Family History     Family History   Problem Relation Age of Onset    Heart Disease Mother     Diabetes Mother     Diabetes Father     Stroke Father     Cerebral Palsy Father     Breast cancer Maternal Aunt     Colon cancer Neg Hx     Esophageal cancer Neg Hx     Liver cancer Neg Hx     Pancreatic Cancer Neg Hx     Rectal cancer Neg Hx     Stomach cancer Neg Hx      Social History     Socioeconomic History    Marital status: Married   Tobacco Use    Smoking status: Never    Smokeless tobacco: Never   Substance and Sexual Activity    Alcohol use: No    Drug use: No    Sexual activity: Yes     Partners: Female     Vitals & Physical Exam     Current Vitals Vitals Range (24 Hours)   BP 142/82    Pulse 65    Temp 36.1 C (97 F) (Temporal)    Ht 1.702 m ('5\' 7"'$ )    Wt 70.3 kg (155 lb)    SpO2 96%    BMI 24.28 kg/m  Temp:  [36.1 C (97 F)] 36.1 C (97 F)  Heart Rate:  [65] 65  BP: (142)/(82) 142/82     Physical Exam:  General: Patient sitting comfortably in chair. Calm, cooperative, in no acute distress.  HEENT: Right-sided cerumen impaction. Left TM translucent without evidence of erythema or discharge.  Neck: No cervical or supraclavicular lymphadenopathy.  Neurological: Weber test with localization to left ear. Rinne test with AC>BC on left, BC>AC on right.  Extremity: Bilateral callus over 1st metatarsal head. No open wounds or nail bed changes.     [name] is a 76 y.o. male with history of HTN, T2DM, BPH, and GERD who presents with mixed conductive-sensorineural hearing loss, likely related to right-sided cerumen impaction and age-related presbycusis. Will prescribe otic drops to help clear cerumen and provide referral to audiology for further evaluation.    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  -Consult to Audiology.    #Left Knee Osteoarthritis  -Continue with Tylenol 1000 mg tid & prescribe Voltaren gel.  -Hold off on imaging or Physical Therapy given improvement with above.    #Type 2 Diabetes Mellitus  -Last A1c 6.1% in October 2022. Will recheck in 3 months.  -Urine microalbumin/creatinine completed November 2021. Will recheck today.  -Follows with Ophthalmology. Last appointment February 2022, encouraged patient to schedule appointment.  -Diabetic foot exam completed in clinic today.  -Follows with Podiatry. Last appointment January 2022, encouraged patient to schedule appointment.    #Hypertension  -Blood pressure within acceptable range.  -Continue with lisinopril 20 mg daily.    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 Repeat scan in August 2021, unchanged risk assessment.  -Continue with tamsulosin 0.4 mg nightly.  -Follows with Urology.    Health Maintenance   Abdominal Aortic Aneurysm (one-time screening in men 65-75 with history of tobacco use) - N/A  Breast Cancer (biennial screening in women 50-74) - N/A  Cervical Cancer (Pap smear every 3 years in women 21-65) - N/A  Colorectal Cancer (colonoscopy every 10 years in adults 45-75) - Patient declined  Diabetes (annual A1c in overweight or adults >45) - Last A1c 6.1% October 2022  Immunizations - Zoster, Tdap, COVID (4 Nature conservation officer). Patient to receive COVID booster today.  Lipid Disorder (annual screening in adults with increased CHD risk) - Will recheck with next labs  Lung Cancer (annual screening in adults 50-80 with 20 pack-year history and active smoking within 15 years) - N/A  Osteoporosis (one-time screening in women >65) - N/A    Orders Placed     Orders Placed This Encounter     Microalbumin, Urine, Random    Creatinine, urine    AMB REFERRAL TO A[facility]IOLOGY - [facility]    carbamide peroxide (DEBROX) 6.5 % otic solution     Tristan Channel, MD   Signed 05/07/2021 at 9:11 AM

## 2021-05-07 ENCOUNTER — Ambulatory Visit: Payer: 59 | Attending: Internal Medicine | Admitting: Internal Medicine

## 2021-05-07 ENCOUNTER — Other Ambulatory Visit: Payer: Self-pay

## 2021-05-07 ENCOUNTER — Encounter: Payer: Self-pay | Admitting: Internal Medicine

## 2021-05-07 VITALS — BP 142/82 | HR 65 | Temp 97.0°F | Ht 67.0 in | Wt 155.0 lb

## 2021-05-07 DIAGNOSIS — H908 Mixed conductive and sensorineural hearing loss, unspecified: Secondary | ICD-10-CM | POA: Insufficient documentation

## 2021-05-07 DIAGNOSIS — E119 Type 2 diabetes mellitus without complications: Secondary | ICD-10-CM | POA: Insufficient documentation

## 2021-05-07 DIAGNOSIS — Z23 Encounter for immunization: Secondary | ICD-10-CM | POA: Insufficient documentation

## 2021-05-07 MED ORDER — CARBAMIDE PEROXIDE 6.5 % OT SOLN *I*
5.0000 [drp] | Freq: Two times a day (BID) | OTIC | 2 refills | Status: AC
Start: 2021-05-07 — End: 2021-05-21

## 2021-05-07 NOTE — Addendum Note (Signed)
Addended by: Maricela Curet on: 05/07/2021 09:58 AM     Modules accepted: Orders

## 2021-05-07 NOTE — Patient Instructions (Addendum)
1. Programe una cita con el oculista (oftalmlogo) para el prximo mes.    2. Le he recetado gotas para los odos. Coloque 5 gotas en el odo derecho dos veces al da.    3. Lo he derivado al audilogo. Ellos le llamarn para programar una cita.

## 2021-05-09 LAB — MICROALBUMIN, URINE, RANDOM
Creatinine,UR: 101 mg/dL (ref 20–300)
Microalb/Creat Ratio: 100.9 mg MA/g CR — ABNORMAL HIGH (ref 0.0–29.9)
Microalbumin,UR: 10.19 mg/dL

## 2021-06-02 ENCOUNTER — Other Ambulatory Visit: Payer: Self-pay

## 2021-06-02 ENCOUNTER — Ambulatory Visit: Payer: 59 | Attending: Internal Medicine | Admitting: Audiology

## 2021-06-02 DIAGNOSIS — H903 Sensorineural hearing loss, bilateral: Secondary | ICD-10-CM | POA: Insufficient documentation

## 2021-06-02 NOTE — Progress Notes (Addendum)
[facility]  Part of [facility]   [address], [address]  [place],  [place] [address]  Phone: [phone], Fax: [phone]     Outpatient Visit  Patient: Tristan Mcbride   MR Number: [id number]   Date of Birth: Jun 18, 1945   Date of Visit: 06/02/2021      PURE-TONE TEST RESULTS  Type of Testing: conventional      Test Reliability: good  Transducer: insert  Booth: 1  ANSI S3.21.2004 (R2009)     Air Conduction Testing (dB HL and kHz)     LEFT EAR RIGHT EAR     0.125 0.25 0.50  0.75 1.0 1.5 2.0 3.0 4.0 6.0 8.0  0.125 0.25 0.50 0.75 1.0 1.5 2.0 3.0 4.0 6.0 8.0     20 25   30    35 45 70 65 65      100 100   110   NR   NR   NR                                                                    Effective Masking Level                              50 50   50   50   50   50     Bone Conduction Testing (dB HL and kHz)  Bone conduction was unmasked/unspecified      0.25 0.50  0.75 1.0 1.5 2.0 3.0 4.0     0.25 0.50 0.75 1.0 1.5 2.0 3.0 4.0      20 25   25   30    65                                                                                          Effective Masking Level                                               SPEECH AUDIOMETRY     SAT SRT Score dB HL/MCL EML  Test  SAT SRT Score dB HL/MCL EML  Test                              EML   EML            EML   EML               Please refer to scanned [facility] [id number] MR for additional information     Notes  Threshold in dB HL  Frequency in kiloHertz (kHz) Legend   dB=decibels  HL=Hearing  Level  NR=No Response  VT=Vibro-Tactile EML=Effective Masking Level  SAT=Speech Awareness Threshold  SRT=Speech Reception  Threshold  MCL=Most Comfortable Loudness Level  MLV=Monitored Live Voice  CD=Compact Disk       HISTORY: Tristan Mcbride was referred by Dr. Patrici Ranks for an audiologic evaluation to determine if he is a candidate for medical, surgical, or rehabilitative services. He is accompanied by his wife and an in-person [facility]  Spanish interpreter. Patient was last seen in this clinic on 10/22/2011 at which time results indicated a mild to moderate sensorineural hearing loss from 2000 - 8000 Hz in the left ear and a profound sensorineural hearing loss in right ear. Patient followed up medically with Dr. Marylou Mccoy regarding the asymmetry between ears. Subsequent MRI results did not indicate retrocochlear involvement. Today, Patient presents with concerns for a gradual decline in hearing in the left ear over the past several years. His wife notes that he keeps the TV at a very loud level. Patient endorses mild, intermittent otalgia in the left ear but denies aural fullness and otorrhea.    FINDINGS: Pure-tone test results indicate mild sloping to moderately-severe sensorineural hearing loss in the left ear and a profound, known to be sensorineural hearing loss in the right ear. Compared to previous evaluation on 10/22/2011, thresholds in the left ear have declined 5 - 30 dB at all tested frequencies.    Today's results were discussed with the patient. The patient would likely benefit from amplification in his left ear, especially since this is his only good hearing ear.  The patient and his wife were encouraged to contact their insurance regarding potential hearing aid benefits should they elect to pursue amplification as his current insurance is not par with [facility] for hearing aid coverage.  Marland Kitchen    RECOMMENDATIONS: Audiologic re-evaluation as per Dr. Patrici Ranks.    Avel Peace, B.S.  Audiology Licensure [facility]    Ophelia Shoulder. Linward Foster, PhD, MBA  Director of Research and Education in Audiology   [facility]

## 2021-06-11 ENCOUNTER — Telehealth: Payer: Self-pay | Admitting: Internal Medicine

## 2021-06-11 DIAGNOSIS — I10 Essential (primary) hypertension: Secondary | ICD-10-CM

## 2021-06-11 DIAGNOSIS — E785 Hyperlipidemia, unspecified: Secondary | ICD-10-CM

## 2021-06-11 MED ORDER — LISINOPRIL 20 MG PO TABS *I*
20.0000 mg | ORAL_TABLET | Freq: Every day | ORAL | 0 refills | Status: DC
Start: 2021-06-11 — End: 2022-02-26

## 2021-06-11 MED ORDER — ATORVASTATIN CALCIUM 20 MG PO TABS *I*
20.0000 mg | ORAL_TABLET | Freq: Every day | ORAL | 1 refills | Status: DC
Start: 2021-06-11 — End: 2022-01-29

## 2021-06-11 NOTE — Telephone Encounter (Signed)
Copied from [place] [id number]. Topic: Medications/Prescriptions - Medication Question/Problem  >> Jun 11, 2021 11:11 AM Tilden Dome wrote:  Asenth with [facility] spoke with patient and his wife [name] this am     Is requesting change to the dispense amount to 100 tablets for the lisinopril and 100 tablets on the atorvastatin - 90 day prescriptions   for patient.  Cost saving to patient.     Is requesting prescriptions for his lisinopril (PRINIVIL,ZESTRIL) 20 mg tablet and atorvastatin (LIPITOR) 20 mg tablet to be sent to [facility] can be reached [place] can be reached [phone] .

## 2021-06-11 NOTE — Telephone Encounter (Signed)
Requested medications sent.

## 2021-08-26 NOTE — Progress Notes (Unsigned)
[facility] Office Note:    Subjective   This visit was conducted with an in-person Spanish interpreter.Tristan Mcbride is a 76 y.o. male with history of HTN,HLD,T2DM, BPH, and GERD who presents todaywith his wifefor follow-up.     The patient was last seen in our clinic in January 2023, at which time we discussed his progressive hearing loss thought to be related to age-related presbycusis, for which he was referred to Audiology. He was evaluated in Audiology clinic in February 2023, at which time testing indicated a mild to moderate sensorineural hearing loss in the left ear, and a profound sensorineural hearing loss in the right ear. He and his wife were encouraged to contact their insurance company regarding potential hearing aid benefits, as his current insurance was not compatible with [facility] for hearing aid coverage.    Today***    Medications & Allergies     Current Outpatient Medications   Medication Sig   . atorvastatin (LIPITOR) 20 mg tablet Take 1 tablet (20 mg total) by mouth daily (with dinner)   . lisinopril (PRINIVIL,ZESTRIL) 20 mg tablet Take 1 tablet (20 mg total) by mouth daily   . metFORMIN (GLUCOPHAGE) 500 mg tablet Take 1 tablet (500 mg total) by mouth 2 times daily (with meals)   . tamsulosin (FLOMAX) 0.4 mg capsule Take 1 capsule (0.4 mg total) by mouth every evening   . ketoconazole (NIZORAL) 2 % cream Apply topically daily   . blood glucose (ONETOUCH VERIO) test strip TEST twice a day (Patient not taking: No sig reported)   . Lancets 30G MISC Use as directed. Dispense lowest cost lancet based on patient's insurance. (Patient not taking: No sig reported)   . senna (SENOKOT) 8.6 mg tablet Take 2 tablets by mouth daily   . blood pressure monitor, automatic with arm cuff Use as directed to monitor blood pressure (Patient not taking: No sig reported)   . psyllium (METAMUCIL SMOOTH TEXTURE) 28.3 % POWD powder 1 tablespoon in 8 oz of water daily by mouth. (Patient  not taking: No sig reported)   . blood glucose monitor system Use as directed to test blood glucose. (Patient not taking: No sig reported)   . blood glucose monitor w/Device KIT Test as directed (Patient not taking: No sig reported)   . alcohol swabs pads Use as directed to test blood sugar twice daily. (Patient not taking: No sig reported)   . naproxen sodium (ANAPROX) 220 MG tablet Take 220 mg by mouth as needed  for Pain        He is allergic to tetanus toxoids and no known latex allergy.    Past Medical & Surgical History     Past Medical History:   Diagnosis Date   . BPH (benign prostatic hypertrophy)    . DM (diabetes mellitus) 10/13/2017   . Esophageal reflux    . Fibrolipoma     intramuscular adipose tissue   . Hyperlipidemia    . Hypertension    . Hypoglycemia    . Kidney cyst    . Rotator cuff tendinitis     Right   . Sexual dysfunction     absent ejaculation   . Type 2 diabetes mellitus    . Weight loss      Past Surgical History:   Procedure Laterality Date   . KNEE SURGERY      left   . LEG TENDON SURGERY      Right     Social &  Family History     Family History   Problem Relation Age of Onset   . Heart Disease Mother    . Diabetes Mother    . Diabetes Father    . Stroke Father    . Cerebral Palsy Father    . Breast cancer Maternal Aunt    . Colon cancer Neg Hx    . Esophageal cancer Neg Hx    . Liver cancer Neg Hx    . Pancreatic Cancer Neg Hx    . Rectal cancer Neg Hx    . Stomach cancer Neg Hx      Social History     Socioeconomic History   . Marital status: Married   Tobacco Use   . Smoking status: Never   . Smokeless tobacco: Never   Substance and Sexual Activity   . Alcohol use: No   . Drug use: No   . Sexual activity: Yes     Partners: Female     Vitals & Physical Exam     Current Vitals Vitals Range (24 Hours)   There were no vitals taken for this visit. BP: ()/()   Arterial Line BP: ()/()      Physical Exam:  General: Patient sitting comfortably in chair. Calm, cooperative, in no acute  distress.  HEENT: NC/AT, anicteric, mucous membranes moist without exudate or lesions.  Neck: No cervical or supraclavicular lymphadenopathy, no JVD.  Cardiovascular: RRR, no m/r/g, normal S1 and S2.  Pulmonary: NWOB, CTAB.  Abdominal: Soft, NTND, +ve BS.  Neuro: Alert and oriented x3. Speech is fluent, comprehension is intact.   Extremities: Warm, dry, 2+ radial and dorsalis pedis pulses, no edema.     Assessment & Plan   Tristan Mcbride is a 76 y.o. male with history of HTN, T2DM, BPH, GERD & bilateral sensorineural hearing loss who presents today for follow-up.    #Bilateral Sensorineural Hearing Loss  -Initiate therapy with Debrox otic solution to right ear 5 drops bid.  -Follows with Audiology, pending insurance coverage.    #Left KneeOsteoarthritis  -Continue with Tylenol 1000 mg tid & prescribe Voltaren gel.  -Hold off on imaging or Physical Therapy given improvement with above.    #Type 2 Diabetes Mellitus  -Last A1c6.1% inOctober 2022. Will recheck today.  -Urine microalbumin/creatininecompleted January 2023.  -Follows with Ophthalmology. Last appointmentFebruary 2022, encouraged patient to schedule appointment.  -Diabetic foot exam completed January 2023.  -Follows with Podiatry. Last appointment January 2022, encouraged patient to schedule appointment.    #Hypertension  -Blood pressure within acceptable range.  -Continue with lisinopril20 mg daily.    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 Repeat scan in August 2021, unchanged risk assessment.  -Continue with tamsulosin 0.4 mg nightly.  -Follows with Urology.    Health Maintenance   Abdominal Aortic Aneurysm (one-time screening in men 65-75 with history of tobacco use) -N/A  Breast Cancer (biennial screening in women 50-74) -N/A  Cervical Cancer (Pap smear every 3 years in women 21-65)  -N/A  Colorectal Cancer (colonoscopy every 10 years in adults 45-75) -Patient declined  Diabetes(annual A1c in overweight or adults >45) - Last A1c 6.1% October 2022  Immunizations- Zoster, Tdap.  Lipid Disorder(annual screening in adults with increased CHD risk) - Willrecheck with next labs  Lung Cancer(annual screening in adults 50-80 with 20 pack-year history and active smoking within 15 years) -  N/A  Osteoporosis(one-time screening in women >65) - N/A    Orders Placed     Orders Placed This Encounter   No orders placed during this encounter.     [name], MD   Signed 08/26/2021 at 9:51 PM

## 2021-08-27 ENCOUNTER — Ambulatory Visit: Payer: 59 | Admitting: Internal Medicine

## 2021-08-28 ENCOUNTER — Encounter: Payer: Self-pay | Admitting: Internal Medicine

## 2021-09-09 ENCOUNTER — Other Ambulatory Visit: Payer: Self-pay

## 2021-09-09 ENCOUNTER — Ambulatory Visit: Payer: 59 | Admitting: Registered Nurse

## 2021-09-09 ENCOUNTER — Telehealth: Payer: Self-pay | Admitting: Internal Medicine

## 2021-09-09 VITALS — BP 152/79 | HR 65 | Temp 97.9°F | Wt 151.3 lb

## 2021-09-09 DIAGNOSIS — B029 Zoster without complications: Secondary | ICD-10-CM

## 2021-09-09 MED ORDER — VALACYCLOVIR HCL 1000 MG PO TABS *I*
1000.0000 mg | ORAL_TABLET | Freq: Three times a day (TID) | ORAL | 0 refills | Status: AC
Start: 2021-09-09 — End: 2021-09-16

## 2021-09-09 NOTE — Patient Instructions (Addendum)
For the shingles Valacyclovir 1000 mg three times daily for 7 days     For the pain I recommend you take Tylenol 1000 mg every 8 hours you can also add in ibuprofen     You can also also alternate Ibuprofen 650 mg up to 2 times daily in between the tylenol.    Please come back for follow up general health care and to evaluate your rash

## 2021-09-09 NOTE — Telephone Encounter (Signed)
Interpreter [phone]  Called and spoke with spouse [name].  Patient has a  large blister on the right side of his back that started when he was in [place] approximately 2 weeks ago.  Spouse denies fevers.  Spouse was offered an appointment at 3:30 today, 5/30, for [facility].  Appointment was accepted.

## 2021-09-09 NOTE — Telephone Encounter (Signed)
Copied from CRM [id number]. Topic: Access to Care - Speak to Provider/Office Staff  >> Sep 09, 2021  9:04 AM Uvaldo Bristle wrote:  Georgina Peer, Other: Spouse, calling to report that the patient is experiencing Blister on back that is infected. These symptoms have been going on for 2 week(s)    Patient requesting same day appointment? yes  Patient requesting call back? yes    Phone number confirmed at: [phone]    [name] stated the patient was in [place] and developed the blister and it is painful and itching, he is now home. [name] stated she is still in [place] [name] stated the patient is at work, but will call to make sure the patient has the volume turned up on his phone. [name] wanted to make sure the office uses an interpreter.     [facility] Call back:  [phone]     [place] - [phone]

## 2021-09-09 NOTE — Progress Notes (Signed)
[facility]: Acute Visit Note    HPI/ROS:      Tristan Mcbride is a 76 y.o. male with PMHx significant for DM2, HTN, BPH, and hearing loss who presents to clinic today for evaluation of large blister.     Tristan Mcbride arrives to this visit with his family friend to provide translation. A translator service was offered however he declined this services. He reports he was recently in [place] where there was a flea infestation. He noticed a series of circular bites on his legs but later approximately 5/16 or 5/17 began to have pain along the right side of his torso which later erupted into a red raised rash. The rash has been very painful, he has occasionally taken Tylenol over the last 2 weeks to help with the pain which has been somewhat helpful. The rash started as small red bumps which have spread out and progressively became large patches. He has not noticed drainage from the rash nor crusting but has seen blistering.  He denies fever, nausea, vomiting, diarrhea or changes in appetite. He reports that he did receive an immunization for herpes zoster previously but dose not recall when this was done.     1. Herpes zoster        Medications:     Current Outpatient Medications on File Prior to Visit   Medication Sig Dispense Refill   . atorvastatin (LIPITOR) 20 mg tablet Take 1 tablet (20 mg total) by mouth daily (with dinner) 100 tablet 1   . lisinopril (PRINIVIL,ZESTRIL) 20 mg tablet Take 1 tablet (20 mg total) by mouth daily 100 tablet 0   . metFORMIN (GLUCOPHAGE) 500 mg tablet Take 1 tablet (500 mg total) by mouth 2 times daily (with meals) 360 tablet 3   . tamsulosin (FLOMAX) 0.4 mg capsule Take 1 capsule (0.4 mg total) by mouth every evening 90 capsule 1   . ketoconazole (NIZORAL) 2 % cream Apply topically daily 30 g 2   . blood glucose (ONETOUCH VERIO) test strip TEST twice a day (Patient not taking: No sig reported) 120 each 0   . Lancets 30G MISC Use as directed. Dispense lowest cost lancet based on  patient's insurance. (Patient not taking: No sig reported) 100 each 5   . senna (SENOKOT) 8.6 mg tablet Take 2 tablets by mouth daily 90 tablet 1   . blood pressure monitor, automatic with arm cuff Use as directed to monitor blood pressure (Patient not taking: No sig reported) 1 kit 0   . psyllium (METAMUCIL SMOOTH TEXTURE) 28.3 % POWD powder 1 tablespoon in 8 oz of water daily by mouth. (Patient not taking: No sig reported) 1 Bottle 3   . blood glucose monitor system Use as directed to test blood glucose. (Patient not taking: No sig reported) 1 kit 0   . blood glucose monitor w/Device KIT Test as directed (Patient not taking: No sig reported) 1 kit 0   . alcohol swabs pads Use as directed to test blood sugar twice daily. (Patient not taking: No sig reported) 100 each 5   . [DISCONTINUED] insulin glargine (LANTUS SOLOSTAR) 100 unit/mL injection pen Inject 6 Units into the skin nightly 3 mL 2   . naproxen sodium (ANAPROX) 220 MG tablet Take 220 mg by mouth as needed  for Pain       No current facility-administered medications on file prior to visit.       Allergies:     Allergies   Allergen Reactions   .  Tetanus Toxoids      On Amb Int Med note from 08/20/09   . No Known Latex Allergy        Physical Exam:     Vitals:    09/09/21 1531   BP: 152/79   Pulse: 65   Temp: 36.6 C (97.9 F)   SpO2: 97%   Weight: 68.6 kg (151 lb 4.8 oz)       General: Pleasant, conversant, HOH, spanish speaking. NAD  HEENT: Moist mucus membranes. No oral lesions.  Pulmonary: Breathing comfortably. Lungs CTAB.   Cardiovascular: RRR. No murmurs, rubs, or gallops.  Abdominal: Soft. Non-distended.  Bowel sounds present. Tenderness to touch along dermatomal rash.   Extremities: Warm, dry. No peripheral edema.  Skin: See media below. Er                       Assessment and Plan:     Tristan Mcbride is a 76 y.o. male who presents today for herpes zoster rash with PMH of DM2, HTN, BPH, and hearing loss. He did receive 1/2 herpes zoster vaccinations     1.  Herpes zoster: rash began 2 weeks prior and has not yet scabbed, this may suggest some underlines immunocompromise which could be 2/2 to DM2 alone. HIV 2013 and 2020 negative.   - valACYclovir (VALTREX) 1 gm tablet; Take 1 tablet (1,000 mg total) by mouth 3 times daily for 7 days  Dispense: 21 tablet; Refill: 0  - Tylenol 1000 mg every 8 hours as needed, can alternate ibuprofen 650 mg in between Tylenol but advised that ibuprofen can worsen kidney function     Follow up in 2 weeks for rash check and general health care    Hans Eden, MD 09/09/2021 4:33 PM  ZOX#[id number]

## 2021-09-18 ENCOUNTER — Other Ambulatory Visit: Payer: Self-pay | Admitting: Internal Medicine

## 2021-09-18 MED ORDER — NAPROXEN SODIUM 220 MG PO TABS *I*
220.0000 mg | ORAL_TABLET | ORAL | 1 refills | Status: DC | PRN
Start: 2021-09-18 — End: 2022-06-08

## 2021-09-18 NOTE — Telephone Encounter (Signed)
Refill request routed to Dr. Threasa Beards 09/18/2021 4:04 PM

## 2021-09-18 NOTE — Telephone Encounter (Signed)
Copied from CRM #[id number]. Topic: Medications/Prescriptions - Refill Request  >> Sep 18, 2021 11:22 AM Tristan Mcbride wrote:  [name], Patient, calling to request prescription(s)   Requested Prescriptions     Pending Prescriptions Disp Refills   . naproxen sodium (ANAPROX) 220 mg tablet       Sig: Take 1 tablet (220 mg total) by mouth as needed  for Pain     to be sent to the following Pharmacy [facility] [phone] - [place], [place] - [address]    Is patient out of the medication? yes    Patient's last visit? 05.30.2023    Were the demographics and insurance verified?: Yes    Patient can be reached if necessary at [phone]

## 2021-09-19 ENCOUNTER — Telehealth: Payer: Self-pay | Admitting: Internal Medicine

## 2021-09-19 NOTE — Telephone Encounter (Signed)
Copied from CRM #[id number]. Topic: Medications/Prescriptions - Prescription Status/Information  >> Sep 19, 2021  1:40 PM Elnita Maxwell wrote:  [name] -spouse to [place] says she called [facility] and they do not have anything ready.    [name] does not know the name of the medication.    Writer told her that in order to check on a status of a refill we need the name of the medication.    Told Carmen to call us from home when she knows the name.    [name] is not on HIPAA.    FYI

## 2021-09-20 ENCOUNTER — Other Ambulatory Visit: Payer: Self-pay | Admitting: Student in an Organized Health Care Education/Training Program

## 2021-09-20 DIAGNOSIS — N400 Enlarged prostate without lower urinary tract symptoms: Secondary | ICD-10-CM

## 2021-09-20 MED ORDER — TAMSULOSIN HCL 0.4 MG PO CAPS *I*
0.4000 mg | ORAL_CAPSULE | Freq: Every evening | ORAL | 0 refills | Status: DC
Start: 2021-09-20 — End: 2021-12-16

## 2021-09-20 NOTE — Progress Notes (Signed)
Sent refill of tamsulin for 90 days. Only sent 90 days as no recent labs so will defer longer refills to [facility].     Minerva Areola, MD  PGY-3 Internal Medicine

## 2021-09-24 ENCOUNTER — Ambulatory Visit: Payer: 59 | Admitting: Internal Medicine

## 2021-09-25 NOTE — Progress Notes (Signed)
[facility]       Reason for visit: There were no encounter diagnoses.  No diagnosis found.    HPI:  Tristan Mcbride is 76 y.o. year old male with history of DM2, HTN, BPH, and hearing loss who presents to clinic today for a follow up visit.   - Last seen at [facility] on 09/09/21, Herpes zoster.    - Started on Valacyclovir 1000 mg TID for 7 days.    - T2DM: will need A1C, follow up lab work.     - foot exam completed 1/25   - HTN: Lisinopril 20 mg, daily.    - Hearing loss: Debrox otic solution, Audiology consulted. Patient would benefit from amplification (left ear). Patient was instructed to contact insurance regarding hearing aid coverage (not covered through [facility]).     Spanish interpreter was present for the duration of the visit.         Past Medical History:   Diagnosis Date   . BPH (benign prostatic hypertrophy)    . DM (diabetes mellitus) 10/13/2017   . Esophageal reflux    . Fibrolipoma     intramuscular adipose tissue   . Hyperlipidemia    . Hypertension    . Hypoglycemia    . Kidney cyst    . Rotator cuff tendinitis     Right   . Sexual dysfunction     absent ejaculation   . Type 2 diabetes mellitus    . Weight loss        Medications:     Current Outpatient Medications   Medication Sig   . tamsulosin (FLOMAX) 0.4 mg capsule Take 1 capsule (0.4 mg total) by mouth every evening   . naproxen sodium (ANAPROX) 220 mg tablet Take 1 tablet (220 mg total) by mouth as needed  for Pain   . atorvastatin (LIPITOR) 20 mg tablet Take 1 tablet (20 mg total) by mouth daily (with dinner)   . lisinopril (PRINIVIL,ZESTRIL) 20 mg tablet Take 1 tablet (20 mg total) by mouth daily   . metFORMIN (GLUCOPHAGE) 500 mg tablet Take 1 tablet (500 mg total) by mouth 2 times daily (with meals)   . ketoconazole (NIZORAL) 2 % cream Apply topically daily   . blood glucose (ONETOUCH VERIO) test strip TEST twice a day (Patient not taking: No sig reported)   . Lancets 30G MISC Use as directed. Dispense lowest cost lancet  based on patient's insurance. (Patient not taking: No sig reported)   . senna (SENOKOT) 8.6 mg tablet Take 2 tablets by mouth daily   . blood pressure monitor, automatic with arm cuff Use as directed to monitor blood pressure (Patient not taking: No sig reported)   . psyllium (METAMUCIL SMOOTH TEXTURE) 28.3 % POWD powder 1 tablespoon in 8 oz of water daily by mouth. (Patient not taking: No sig reported)   . blood glucose monitor system Use as directed to test blood glucose. (Patient not taking: No sig reported)   . blood glucose monitor w/Device KIT Test as directed (Patient not taking: No sig reported)   . alcohol swabs pads Use as directed to test blood sugar twice daily. (Patient not taking: No sig reported)     No current facility-administered medications for this visit.       Medication list reconciled this visit    Allergies:     Allergies   Allergen Reactions   . Tetanus Toxoids      On Amb Int Med note from  08/20/09   . No Known Latex Allergy        Social history      Social History     Tobacco Use   . Smoking status: Never   . Smokeless tobacco: Never   Vaping Use   . Vaping status: Not on file   Substance Use Topics   . Alcohol use: No       Review of Systems     Review of Systems   Constitutional: Negative for chills, fever and malaise/fatigue.   Respiratory: Negative for cough, sputum production and shortness of breath.    Cardiovascular: Negative for chest pain and palpitations.   Neurological: Negative for dizziness and headaches.     12 point review of system was negative except those mentioned in HPI    Physical Exam:     Physical Exam  Constitutional:       General: He is not in acute distress.     Appearance: Normal appearance. He is not ill-appearing.   Cardiovascular:      Rate and Rhythm: Normal rate and regular rhythm.      Pulses: Normal pulses.      Heart sounds: Normal heart sounds. No murmur heard.     No friction rub. No gallop.   Pulmonary:      Effort: Pulmonary effort is normal.       Breath sounds: Normal breath sounds. No wheezing or rhonchi.   Skin:     Capillary Refill: Capillary refill takes less than 2 seconds.   Neurological:      Mental Status: He is alert and oriented to person, place, and time.   Psychiatric:         Mood and Affect: Mood normal.         Behavior: Behavior normal.            There were no vitals filed for this visit.  Wt Readings from Last 3 Encounters:   09/09/21 68.6 kg (151 lb 4.8 oz)   05/07/21 70.3 kg (155 lb)   01/15/21 68.5 kg (151 lb)     BP Readings from Last 3 Encounters:   09/09/21 152/79   05/07/21 142/82   01/15/21 132/61           ASSESSMENT/PLAN:     There are no diagnoses linked to this encounter.        Follow up: No follow-ups on file.      There are no Patient Instructions on file for this visit.         Signed by [name], NP [facility] 6/15/20235:06 PM

## 2021-09-26 ENCOUNTER — Ambulatory Visit: Payer: 59 | Admitting: Internal Medicine

## 2021-09-26 ENCOUNTER — Encounter: Payer: Self-pay | Admitting: Internal Medicine

## 2021-09-26 ENCOUNTER — Other Ambulatory Visit: Payer: Self-pay

## 2021-09-26 VITALS — BP 178/72 | HR 72 | Temp 97.7°F | Ht 67.0 in | Wt 152.3 lb

## 2021-09-26 DIAGNOSIS — B029 Zoster without complications: Secondary | ICD-10-CM

## 2021-09-26 DIAGNOSIS — Z789 Other specified health status: Secondary | ICD-10-CM

## 2021-09-26 MED ORDER — GABAPENTIN 300 MG PO CAPSULE *I*
300.0000 mg | ORAL_CAPSULE | Freq: Every evening | ORAL | 0 refills | Status: DC
Start: 2021-09-26 — End: 2021-10-03

## 2021-09-26 MED ORDER — GABAPENTIN 300 MG PO CAPSULE *I*
300.0000 mg | ORAL_CAPSULE | Freq: Every evening | ORAL | 5 refills | Status: DC
Start: 2021-09-26 — End: 2021-09-26

## 2021-09-26 MED ORDER — TRAMADOL HCL 50 MG PO TABS *I*
25.0000 mg | ORAL_TABLET | Freq: Two times a day (BID) | ORAL | 0 refills | Status: AC | PRN
Start: 2021-09-26 — End: 2021-10-03

## 2021-09-26 MED ORDER — LIDOCAINE 5 % EX PTCH *I*
1.0000 | MEDICATED_PATCH | CUTANEOUS | 0 refills | Status: AC
Start: 2021-09-26 — End: 2021-10-10

## 2021-10-02 NOTE — Progress Notes (Signed)
[facility]       Reason for visit: The primary encounter diagnosis was Herpes zoster. Diagnoses of Acute pain associated with herpes zoster, Hypertension, unspecified type, and Type 2 diabetes mellitus were also pertinent to this visit.  Encounter Diagnoses   Name Primary?   . Herpes zoster Yes   . Acute pain associated with herpes zoster    . Hypertension, unspecified type    . Type 2 diabetes mellitus        HPI:  Tristan Mcbride is 76 y.o. year old male with history of DM2, HTN, BPH, and hearing losswho presents to clinic today for a follow up visit.   - Last seen at [facility] on 09/26/21 for herpes zoster follow up visit.    - Patient endorsed worsening shingles pain.    - Prescribed a short course of Tramadol (50 mg)/Gabapentin (300 mg, nightly), lidocaine patches.    - Close follow up in one week.     [employer] Spanish interpreter was present for the duration of the visit.     - Patient denies further issues with pain (has medication left: Gabapentin and Tramadol).   - Pain is tolerable with current regimen. Rash no longer acute.   - Has been taking Metformin for his diabetes.  - Has been taking Lisinopril 20 mg for HTN.   - Patient would like to complete lab work today.     Past Medical History:   Diagnosis Date   . BPH (benign prostatic hypertrophy)    . DM (diabetes mellitus) 10/13/2017   . Esophageal reflux    . Fibrolipoma     intramuscular adipose tissue   . Hyperlipidemia    . Hypertension    . Hypoglycemia    . Kidney cyst    . Rotator cuff tendinitis     Right   . Sexual dysfunction     absent ejaculation   . Type 2 diabetes mellitus    . Weight loss        Medications:     Current Outpatient Medications   Medication Sig   . gabapentin (NEURONTIN) 300 mg capsule Take 1 capsule (300 mg total) by mouth nightly   . traMADol (ULTRAM) 50 mg tablet Take 0.5 tablets (25 mg total) by mouth every 12 hours as needed for Pain for up to 7 days  Max daily dose: 50 mg   . lidocaine (LIDODERM) 5 % patch Apply 1  patch onto the skin every 24 hours for 14 days  to the following areas: Right side of abdomen. Remove and discard patch within 12 hours or as directed.   . tamsulosin (FLOMAX) 0.4 mg capsule Take 1 capsule (0.4 mg total) by mouth every evening   . naproxen sodium (ANAPROX) 220 mg tablet Take 1 tablet (220 mg total) by mouth as needed  for Pain   . atorvastatin (LIPITOR) 20 mg tablet Take 1 tablet (20 mg total) by mouth daily (with dinner)   . lisinopril (PRINIVIL,ZESTRIL) 20 mg tablet Take 1 tablet (20 mg total) by mouth daily   . metFORMIN (GLUCOPHAGE) 500 mg tablet Take 1 tablet (500 mg total) by mouth 2 times daily (with meals)   . ketoconazole (NIZORAL) 2 % cream Apply topically daily   . blood glucose (ONETOUCH VERIO) test strip TEST twice a day (Patient not taking: No sig reported)   . Lancets 30G MISC Use as directed. Dispense lowest cost lancet based on patient's insurance. (Patient not taking: No sig reported)   .  senna (SENOKOT) 8.6 mg tablet Take 2 tablets by mouth daily   . blood pressure monitor, automatic with arm cuff Use as directed to monitor blood pressure (Patient not taking: No sig reported)   . psyllium (METAMUCIL SMOOTH TEXTURE) 28.3 % POWD powder 1 tablespoon in 8 oz of water daily by mouth. (Patient not taking: No sig reported)   . blood glucose monitor system Use as directed to test blood glucose. (Patient not taking: No sig reported)   . blood glucose monitor w/Device KIT Test as directed (Patient not taking: No sig reported)   . alcohol swabs pads Use as directed to test blood sugar twice daily. (Patient not taking: No sig reported)     No current facility-administered medications for this visit.       Medication list reconciled this visit    Allergies:     Allergies   Allergen Reactions   . Tetanus Toxoids      On Amb Int Med note from 08/20/09   . No Known Latex Allergy        Social history      Social History     Tobacco Use   . Smoking status: Never   . Smokeless tobacco: Never    Vaping Use   . Vaping status: Not on file   Substance Use Topics   . Alcohol use: No       Review of Systems     Review of Systems   Constitutional: Negative for chills, fever and malaise/fatigue.   Respiratory: Negative for cough, sputum production and shortness of breath.    Cardiovascular: Negative for chest pain and palpitations.   Skin: Positive for rash. Negative for itching.        Denies further pain or issues.    Neurological: Negative for dizziness and headaches.     12 point review of system was negative except those mentioned in HPI    Physical Exam:     Physical Exam  Constitutional:       General: He is not in acute distress.     Appearance: Normal appearance. He is not ill-appearing.   Cardiovascular:      Rate and Rhythm: Normal rate and regular rhythm.      Pulses: Normal pulses.      Heart sounds: Normal heart sounds. No murmur heard.     No friction rub. No gallop.   Pulmonary:      Effort: Pulmonary effort is normal.      Breath sounds: Normal breath sounds. No wheezing or rhonchi.   Skin:     Capillary Refill: Capillary refill takes less than 2 seconds.      Findings: Rash is not crusting or vesicular.      Comments: Healing rash present over left side (rib cage).   No new open areas or vesicles.  Rash has improved since last visit.    Neurological:      Mental Status: He is alert and oriented to person, place, and time.   Psychiatric:         Mood and Affect: Mood normal.         Behavior: Behavior normal.        Vitals:    10/03/21 0822   BP: 122/63   BP Location: Left arm   Patient Position: Sitting   Cuff Size: adult   Pulse: 65   Temp: 36.6 C (97.9 F)   TempSrc: Temporal   SpO2: 96%   Weight: 68.7  kg (151 lb 6.4 oz)   Height: 1.702 m (5\' 7" )     Wt Readings from Last 3 Encounters:   10/03/21 68.7 kg (151 lb 6.4 oz)   09/26/21 69.1 kg (152 lb 4.8 oz)   09/09/21 68.6 kg (151 lb 4.8 oz)     BP Readings from Last 3 Encounters:   10/03/21 122/63   09/26/21 178/72   09/09/21 152/79        ASSESSMENT/PLAN:     Diagnoses and all orders for this visit:      Herpes zoster/ Acute pain associated with herpes zoster  - Patient reports improved pain with current regimen (Tramadol and Gabapentin).   - Patient reports he has some medication left, if needed, but otherwise his pain is tolerable.   - On exam, rash appears to be healing (over ribs/left lateral side). No new vesicles, erythema, drainage, or open areas.   - Plan to refill another short course of Gabapentin for now. Will not continue Tramadol at this time.   -     gabapentin (NEURONTIN) 300 mg capsule; Take 1 capsule (300 mg total) by mouth nightly  - Close follow up in 3 weeks to reassess pain/pain control.   -  Patient to call the office with any acute issues or concerns.     Hypertension, unspecified type  - Blood pressure at goal (<130/80), upon second reading.  - Patient is current taking Lisinopril 20 mg, daily.   - Will recheck BMP today.    - Patient instructed to check blood pressure at home, before and after medications.    - Patient instructed to bring blood pressure readings to next visit.    - Patient instructed to continue taking all medications as prescribed.    - Patient instructed to maintain heart healthy diet (lean meats, increase vegetables intake, and low sodium intake).    -     Basic metabolic panel; Future    Type 2 diabetes mellitus  - Patient reports he continues to take Metformin 500 mg, BID.   - Will reassess A1C today, as patient is overdue.   -     CBC and differential; Future  -     Hemoglobin A1c; Future    Uses Spanish as primary spoken language  - [employer] interpreter used for the duration of the visit.       Follow up: Follow up in about 1 month (around 11/02/2021).      Signed by [name], NP [facility] 6/23/202310:41 AM

## 2021-10-03 ENCOUNTER — Ambulatory Visit: Payer: 59 | Admitting: Internal Medicine

## 2021-10-03 ENCOUNTER — Other Ambulatory Visit
Admission: RE | Admit: 2021-10-03 | Discharge: 2021-10-03 | Disposition: A | Discharge status: Home / Self Care | Payer: 59 | Source: Ambulatory Visit | Attending: Internal Medicine | Admitting: Internal Medicine

## 2021-10-03 ENCOUNTER — Other Ambulatory Visit: Payer: Self-pay

## 2021-10-03 ENCOUNTER — Encounter: Payer: Self-pay | Admitting: Internal Medicine

## 2021-10-03 VITALS — BP 122/63 | HR 65 | Temp 97.9°F | Ht 67.0 in | Wt 151.4 lb

## 2021-10-03 DIAGNOSIS — E119 Type 2 diabetes mellitus without complications: Secondary | ICD-10-CM | POA: Insufficient documentation

## 2021-10-03 DIAGNOSIS — I10 Essential (primary) hypertension: Secondary | ICD-10-CM | POA: Insufficient documentation

## 2021-10-03 DIAGNOSIS — B029 Zoster without complications: Secondary | ICD-10-CM

## 2021-10-03 DIAGNOSIS — Z789 Other specified health status: Secondary | ICD-10-CM

## 2021-10-03 LAB — CBC AND DIFFERENTIAL
Baso # K/uL: 0 10*3/uL (ref 0.0–0.1)
Basophil %: 0.4 %
Eos # K/uL: 0.3 10*3/uL (ref 0.0–0.5)
Eosinophil %: 2.4 %
Hematocrit: 40 % (ref 40–51)
Hemoglobin: 13.1 g/dL — ABNORMAL LOW (ref 13.7–17.5)
IMM Granulocytes #: 0 10*3/uL (ref 0.0–0.0)
IMM Granulocytes: 0.2 %
Lymph # K/uL: 2.8 10*3/uL (ref 1.3–3.6)
Lymphocyte %: 26.4 %
MCH: 29 pg (ref 26–32)
MCHC: 33 g/dL (ref 32–37)
MCV: 87 fL (ref 79–92)
Mono # K/uL: 0.9 10*3/uL — ABNORMAL HIGH (ref 0.3–0.8)
Monocyte %: 8.1 %
Neut # K/uL: 6.6 10*3/uL — ABNORMAL HIGH (ref 1.8–5.4)
Nucl RBC # K/uL: 0 10*3/uL (ref 0.0–0.0)
Nucl RBC %: 0 /100 WBC (ref 0.0–0.2)
Platelets: 208 10*3/uL (ref 150–330)
RBC: 4.6 MIL/uL (ref 4.6–6.1)
RDW: 15.7 % — ABNORMAL HIGH (ref 11.6–14.4)
Seg Neut %: 62.5 %
WBC: 10.5 10*3/uL — ABNORMAL HIGH (ref 4.2–9.1)

## 2021-10-03 LAB — HEMOGLOBIN A1C: Hemoglobin A1C: 6.1 % — ABNORMAL HIGH

## 2021-10-03 LAB — BASIC METABOLIC PANEL
Anion Gap: 9 (ref 7–16)
CO2: 26 mmol/L (ref 20–28)
Calcium: 10.4 mg/dL — ABNORMAL HIGH (ref 8.6–10.2)
Chloride: 105 mmol/L (ref 96–108)
Creatinine: 1.3 mg/dL — ABNORMAL HIGH (ref 0.67–1.17)
Glucose: 87 mg/dL (ref 60–99)
Lab: 22 mg/dL — ABNORMAL HIGH (ref 6–20)
Potassium: 4.3 mmol/L (ref 3.3–5.1)
Sodium: 140 mmol/L (ref 133–145)
eGFR BY CREAT: 58 * — AB

## 2021-10-03 MED ORDER — GABAPENTIN 300 MG PO CAPSULE *I*
300.0000 mg | ORAL_CAPSULE | Freq: Every evening | ORAL | 0 refills | Status: AC
Start: 2021-10-03 — End: 2022-04-01

## 2021-10-03 NOTE — Result Encounter Note (Signed)
Hello!  - Just looking for someone to given Tristan Mcbride a call to let him know that his recent lab work looks great, including his A1C and his renal function.   - No medication changes are needed.     Thank you!  Joyice Faster

## 2021-10-07 ENCOUNTER — Telehealth: Payer: Self-pay | Admitting: Internal Medicine

## 2021-10-07 NOTE — Telephone Encounter (Signed)
-----   Message from [name], NP sent at 10/03/2021  2:51 PM EDT -----  Hello!  - Just looking for someone to given Tristan Mcbride a call to let him know that his recent lab work looks great, including his A1C and his renal function.   - No medication changes are needed.     Thank you!  Joyice Faster

## 2021-10-07 NOTE — Telephone Encounter (Signed)
Interpretor #[id number]    Called and spoke with patient and relayed message from provider. Patient verbalized understanding.

## 2021-10-21 NOTE — Progress Notes (Deleted)
[facility] Office Note:    Subjective   This visit was conducted with an in-person Spanish interpreter.Tristan Mcbride is a 76 y.o. male with history of HTN,HLD,T2DM, BPH, GERD & age-related hearing loss who presents todaywith his wifefor follow-up. The patient was last seen in our clinic in June 2023 for management of post-herpetic neuralgia. At that time, his gabapentin was refilled.    Medications & Allergies     Current Outpatient Medications   Medication Sig   . gabapentin (NEURONTIN) 300 mg capsule Take 1 capsule (300 mg total) by mouth nightly   . tamsulosin (FLOMAX) 0.4 mg capsule Take 1 capsule (0.4 mg total) by mouth every evening   . naproxen sodium (ANAPROX) 220 mg tablet Take 1 tablet (220 mg total) by mouth as needed  for Pain   . atorvastatin (LIPITOR) 20 mg tablet Take 1 tablet (20 mg total) by mouth daily (with dinner)   . lisinopril (PRINIVIL,ZESTRIL) 20 mg tablet Take 1 tablet (20 mg total) by mouth daily   . metFORMIN (GLUCOPHAGE) 500 mg tablet Take 1 tablet (500 mg total) by mouth 2 times daily (with meals)   . ketoconazole (NIZORAL) 2 % cream Apply topically daily   . blood glucose (ONETOUCH VERIO) test strip TEST twice a day (Patient not taking: No sig reported)   . Lancets 30G MISC Use as directed. Dispense lowest cost lancet based on patient's insurance. (Patient not taking: No sig reported)   . senna (SENOKOT) 8.6 mg tablet Take 2 tablets by mouth daily   . blood pressure monitor, automatic with arm cuff Use as directed to monitor blood pressure (Patient not taking: No sig reported)   . psyllium (METAMUCIL SMOOTH TEXTURE) 28.3 % POWD powder 1 tablespoon in 8 oz of water daily by mouth. (Patient not taking: No sig reported)   . blood glucose monitor system Use as directed to test blood glucose. (Patient not taking: No sig reported)   . blood glucose monitor w/Device KIT Test as directed (Patient not taking: No sig reported)   . alcohol swabs pads Use as directed to test  blood sugar twice daily. (Patient not taking: No sig reported)        He is allergic to tetanus toxoids and no known latex allergy.    Past Medical & Surgical History     Past Medical History:   Diagnosis Date   . BPH (benign prostatic hypertrophy)    . DM (diabetes mellitus) 10/13/2017   . Esophageal reflux    . Fibrolipoma     intramuscular adipose tissue   . Hyperlipidemia    . Hypertension    . Hypoglycemia    . Kidney cyst    . Rotator cuff tendinitis     Right   . Sexual dysfunction     absent ejaculation   . Type 2 diabetes mellitus    . Weight loss      Past Surgical History:   Procedure Laterality Date   . KNEE SURGERY      left   . LEG TENDON SURGERY      Right     Social & Family History     Family History   Problem Relation Age of Onset   . Heart Disease Mother    . Diabetes Mother    . Diabetes Father    . Stroke Father    . Cerebral Palsy Father    . Breast cancer Maternal Aunt    . Colon cancer Neg  Hx    . Esophageal cancer Neg Hx    . Liver cancer Neg Hx    . Pancreatic Cancer Neg Hx    . Rectal cancer Neg Hx    . Stomach cancer Neg Hx      Social History     Socioeconomic History   . Marital status: Married   Tobacco Use   . Smoking status: Never   . Smokeless tobacco: Never   Substance and Sexual Activity   . Alcohol use: No   . Drug use: No   . Sexual activity: Yes     Partners: Female     Vitals & Physical Exam     Current Vitals Vitals Range (24 Hours)   There were no vitals taken for this visit. BP: ()/()   Arterial Line BP: ()/()      Physical Exam:  General: Patient sitting comfortably in chair. Calm, cooperative, in no acute distress.  HEENT: NC/AT, anicteric, mucous membranes moist without exudate or lesions.  Neck: No cervical or supraclavicular lymphadenopathy, no JVD.  Cardiovascular: RRR, no m/r/g, normal S1 and S2.  Pulmonary: NWOB, CTAB.  Abdominal: Soft, NTND, +ve BS.  Neuro: Alert and oriented x3. Speech is fluent, comprehension is intact.   Extremities: Warm, dry, 2+ radial and  dorsalis pedis pulses, no edema.     Assessment & Plan   This visit was conducted with an in-person Spanish interpreter.Tristan Mcbride is a 76 y.o. male with history of HTN,HLD,T2DM, BPH, GERD & age-related hearing loss who presents todaywith his wifefor follow-up regarding post-herpetic neuralgia.    #Mixed Conductive-Sensorineural Hearing Loss, #Probable Age-Related Presbycusis  -Initiate therapy with Debrox otic solution to right ear 5 drops bid.  -Consult to Audiology.    #Left KneeOsteoarthritis  -Continue with Tylenol 1000 mg tid & prescribe Voltaren gel.  -Hold off on imaging or Physical Therapy given improvement with above.    #Type 2 Diabetes Mellitus  -Last A1c6.1% inOctober 2022. Will recheck in 3 months.  -Urine microalbumin/creatininecompleted November 2021. Will recheck today.  -Follows with Ophthalmology. Last appointmentFebruary 2022, encouraged patient to schedule appointment.  -Diabetic foot exam completed in clinic today.  -Follows with Podiatry. Last appointment January 2022, encouraged patient to schedule appointment.    #Hypertension  -Blood pressure within acceptable range.  -Continue with lisinopril20 mg daily.    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 Repeat scan in August 2021, unchanged risk assessment.  -Continue with tamsulosin 0.4 mg nightly.  -Follows with Urology.    Health Maintenance   Abdominal Aortic Aneurysm (one-time screening in men 65-75 with history of tobacco use) -N/A  Breast Cancer (biennial screening in women 50-74) -N/A  Cervical Cancer (Pap smear every 3 years in women 21-65) -N/A  Colorectal Cancer (colonoscopy every 10 years in adults 45-75) -Patient declined  Diabetes(annual A1c in overweight or adults >45) - Last A1c 6.1% October 2022  Immunizations- Zoster, Tdap, COVID (4 [employer]). Patient to  receive COVID booster today.  Lipid Disorder(annual screening in adults with increased CHD risk) - Willrecheck with next labs  Lung Cancer(annual screening in adults 50-80 with 20 pack-year history and active smoking within 15 years) - N/A  Osteoporosis(one-time screening in women >65) - N/A    Orders Placed     Orders Placed This Encounter   No orders placed during this encounter.     Nidya Bouyer  Masen Luallen, MD   Signed 10/21/2021 at 4:37 PM

## 2021-10-22 ENCOUNTER — Telehealth: Payer: Self-pay | Admitting: Internal Medicine

## 2021-10-22 ENCOUNTER — Ambulatory Visit: Payer: 59 | Admitting: Internal Medicine

## 2021-10-22 NOTE — Telephone Encounter (Signed)
Copied from CRM #[id number]. Topic: Appointments - Cancel Appointment  >> Oct 22, 2021  2:45 PM Harold Hedge wrote:  Patient's wife Tristan Mcbride called same day to cancel their appointment which is scheduled at 3:10pm    Patient is aware of the no show policy? Yes    Patient can be reached if necessary at [phone].    Reason for cancellation patient is working    Was appointment rescheduled? No the patient will call back to schedule.

## 2021-10-22 NOTE — Telephone Encounter (Signed)
Message noted.

## 2021-10-23 ENCOUNTER — Encounter: Payer: Self-pay | Admitting: Internal Medicine

## 2021-10-30 NOTE — Progress Notes (Signed)
[facility] Office Note:    Subjective   This visit was conducted with an in-person Spanish interpreter.Tristan Mcbride is a 76 y.o. male with history of HTN,HLD,T2DM, BPH, GERD & age-related hearing loss who presents todaywith his wifefor follow-up. The patient was last seen in our clinic in June 2023 for management of post-herpetic neuralgia affecting the right side of his abdomen. At that time, his gabapentin was refilled.     Today, the patient states that pain has resolved entirely. He has had no further changes in his hearing. He was intended to get hearing aids, but has been waiting to hear back about insurance coverage for multiple months. His left knee pain is well-managed with Tylenol, and he would like to defer Physical Therapy for the time being. He has not had any problems with his feet. He states that his vision is getting worse. Although he can see, he states that the numbers seem to "jump around". He is due for new glasses, and is not wearing any currently. He has not seen the Ophthalmologist in more than one year.    Medications & Allergies     Current Outpatient Medications   Medication Sig   . gabapentin (NEURONTIN) 300 mg capsule Take 1 capsule (300 mg total) by mouth nightly   . tamsulosin (FLOMAX) 0.4 mg capsule Take 1 capsule (0.4 mg total) by mouth every evening   . naproxen sodium (ANAPROX) 220 mg tablet Take 1 tablet (220 mg total) by mouth as needed  for Pain   . atorvastatin (LIPITOR) 20 mg tablet Take 1 tablet (20 mg total) by mouth daily (with dinner)   . lisinopril (PRINIVIL,ZESTRIL) 20 mg tablet Take 1 tablet (20 mg total) by mouth daily   . metFORMIN (GLUCOPHAGE) 500 mg tablet Take 1 tablet (500 mg total) by mouth 2 times daily (with meals)   . senna (SENOKOT) 8.6 mg tablet Take 2 tablets by mouth daily   . psyllium (METAMUCIL SMOOTH TEXTURE) 28.3 % POWD powder 1 tablespoon in 8 oz of water daily by mouth.   Marland Kitchen ketoconazole (NIZORAL) 2 % cream Apply topically daily  (Patient not taking: Reported on 10/31/2021)   . blood pressure monitor, automatic with arm cuff Use as directed to monitor blood pressure (Patient not taking: Reported on 01/15/2021)        He is allergic to tetanus toxoids and no known latex allergy.    Past Medical & Surgical History     Past Medical History:   Diagnosis Date   . BPH (benign prostatic hypertrophy)    . DM (diabetes mellitus) 10/13/2017   . Esophageal reflux    . Fibrolipoma     intramuscular adipose tissue   . Hyperlipidemia    . Hypertension    . Hypoglycemia    . Kidney cyst    . Rotator cuff tendinitis     Right   . Sexual dysfunction     absent ejaculation   . Type 2 diabetes mellitus    . Weight loss      Past Surgical History:   Procedure Laterality Date   . KNEE SURGERY      left   . LEG TENDON SURGERY      Right     Social & Family History     Family History   Problem Relation Age of Onset   . Heart Disease Mother    . Diabetes Mother    . Diabetes Father    . Stroke Father    .  Cerebral Palsy Father    . Breast cancer Maternal Aunt    . Colon cancer Neg Hx    . Esophageal cancer Neg Hx    . Liver cancer Neg Hx    . Pancreatic Cancer Neg Hx    . Rectal cancer Neg Hx    . Stomach cancer Neg Hx      Social History     Socioeconomic History   . Marital status: Married   Tobacco Use   . Smoking status: Never   . Smokeless tobacco: Never   Substance and Sexual Activity   . Alcohol use: No   . Drug use: No   . Sexual activity: Yes     Partners: Female     Vitals & Physical Exam     Current Vitals Vitals Range (24 Hours)   BP 142/70 (BP Location: Left arm, Patient Position: Sitting, Cuff Size: adult)   Pulse 66   Temp 36.4 C (97.6 F) (Temporal)   Wt 67.9 kg (149 lb 11.2 oz)   SpO2 97%   BMI 23.45 kg/m  Temp:  [36.4 C (97.6 F)] 36.4 C (97.6 F)  Heart Rate:  [66] 66  BP: (142)/(70) 142/70     Physical Exam:  General: Patient sitting comfortably in chair. Calm, cooperative, in no acute distress.  Cardiovascular: RRR, no m/r/g, normal S1 and  S2.  Pulmonary: NWOB, CTAB.  Neurological: Alert and oriented. Speech is fluent, comprehension is intact.      Assessment & Plan   Tristan Mcbride is a 76 y.o. male with history of HTN,HLD,T2DM, BPH, GERD & age-related hearing loss who presents todaywith his wifefor follow-up regarding post-herpetic neuralgia. Pain has resolved and will defer re-prescription of gabapentin. His left knee pain is well-controlled at this time. He was encouraged to re-schedule appointment with Ophthalmology for his chronic progressive vision change. We will look into insurance coverage for his hearing aids.    #Post-Herpetic Neuralgia  -Defer re-prescription of gabapentin.  -Will need second dose of Shingles vaccine in 3-6 months.    #Mixed Conductive-Sensorineural Hearing Loss, #Probable Age-Related Presbycusis  -Consult to Audiology placed at prior encounter.  -Will look into insurance coverage for hearing aids.    #Left KneeOsteoarthritis  -Continue withTylenol 1000 mg tid.  -Hold off on imaging or Physical Therapy given improvement with above.    #Type 2 Diabetes Mellitus  -Last A1c6.1% inJune2023.  -Urine microalbumin/creatininecompleted January 2023.  -Diabetic foot exam due January 2024.  -Follows with Ophthalmology. Last appointmentFebruary 2022, encouraged patient to schedule appointment.  -Follows with Podiatry. Last appointment January 2022, encouraged patient to schedule appointment.    #Hypertension  -Blood pressure slightly above goal.  -Continue with lisinopril20 mg daily.    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 Repeat scan in August 2021, unchanged risk assessment.  -Continue with tamsulosin 0.4 mg nightly.  -Follows with Urology.    #Chronic Kidney Disease, Stage 3A    Health Maintenance   Abdominal Aortic Aneurysm (one-time screening in men 65-75 with  history of tobacco use) -N/A  Breast Cancer (biennial screening in women 50-74) -N/A  Cervical Cancer (Pap smear every 3 years in women 21-65) -N/A  Colorectal Cancer (colonoscopy every 10 years in adults 45-75) -N/A  Diabetes(annual A1c in overweight or adults >45) - Last A1c 6.1% June 2023  Immunizations- Zoster, Tdap  Lipid Disorder(annual screening in adults  with increased CHD risk) - Willrecheck with next labs  Lung Cancer(annual screening in adults 50-80 with 20 pack-year history and active smoking within 15 years) - N/A  Osteoporosis(one-time screening in women >65) - N/A    Orders Placed     Orders Placed This Encounter   No orders placed during this encounter.     [name], MD   Signed 10/31/2021 at 10:09 AM

## 2021-10-31 ENCOUNTER — Encounter: Payer: Self-pay | Admitting: Internal Medicine

## 2021-10-31 ENCOUNTER — Other Ambulatory Visit: Payer: Self-pay

## 2021-10-31 ENCOUNTER — Ambulatory Visit: Payer: 59 | Admitting: Internal Medicine

## 2021-10-31 VITALS — BP 142/70 | HR 66 | Temp 97.6°F | Wt 149.7 lb

## 2021-10-31 DIAGNOSIS — B0229 Other postherpetic nervous system involvement: Secondary | ICD-10-CM

## 2021-10-31 NOTE — Patient Instructions (Addendum)
1. Llame al [phone] para programar una visita con un oftalmlogo para una cita anual.    2. Nos pondremos en contacto con la compaa de seguros para ver si los audfonos estn cubiertos.

## 2021-12-16 ENCOUNTER — Other Ambulatory Visit: Payer: Self-pay | Admitting: Internal Medicine

## 2021-12-16 DIAGNOSIS — N400 Enlarged prostate without lower urinary tract symptoms: Secondary | ICD-10-CM

## 2021-12-16 NOTE — Telephone Encounter (Signed)
Copied from CRM [id number]. Topic: Medications/Prescriptions - Refill Request  >> Dec 16, 2021  4:02 PM Uvaldo Bristle wrote:  Tristan Mcbride, calling to request prescription(s)   Requested Prescriptions     Pending Prescriptions Disp Refills   . tamsulosin (FLOMAX) 0.4 mg capsule 90 capsule 0     Sig: Take 1 capsule (0.4 mg total) by mouth every evening       to be sent to the following Pharmacy [facility] [phone] - [place], [place] - [address]    Is patient out of the medication? yes    Patient's last visit? 07.21.2023    Were the demographics and insurance verified?: Yes    Patient can be reached if necessary at [phone]

## 2021-12-16 NOTE — Telephone Encounter (Signed)
Refill request routed to Dr. Ignacia Bayley 12/16/2021 2:12 PM

## 2021-12-16 NOTE — Telephone Encounter (Signed)
Refill request routed to Dr. Ignacia Bayley 12/16/2021 4:14 PM

## 2021-12-17 MED ORDER — TAMSULOSIN HCL 0.4 MG PO CAPS *I*
0.4000 mg | ORAL_CAPSULE | Freq: Every evening | ORAL | 0 refills | Status: DC
Start: 2021-12-17 — End: 2022-02-26

## 2022-01-29 ENCOUNTER — Telehealth: Payer: Self-pay | Admitting: Internal Medicine

## 2022-01-29 ENCOUNTER — Other Ambulatory Visit: Payer: Self-pay | Admitting: Internal Medicine

## 2022-01-29 DIAGNOSIS — E785 Hyperlipidemia, unspecified: Secondary | ICD-10-CM

## 2022-01-29 DIAGNOSIS — Z794 Long term (current) use of insulin: Secondary | ICD-10-CM

## 2022-01-29 MED ORDER — ATORVASTATIN CALCIUM 20 MG PO TABS *I*
20.0000 mg | ORAL_TABLET | Freq: Every day | ORAL | 1 refills | Status: DC
Start: 2022-01-29 — End: 2022-02-26

## 2022-01-29 MED ORDER — METFORMIN HCL 500 MG PO TABS *I*
500.0000 mg | ORAL_TABLET | Freq: Two times a day (BID) | ORAL | 3 refills | Status: DC
Start: 2022-01-29 — End: 2022-02-26

## 2022-01-29 NOTE — Telephone Encounter (Signed)
Copied from CRM [id number]. Topic: Medications/Prescriptions - Refill Request  >> Jan 29, 2022  2:19 PM Metro Kung E wrote:  Erroneous Encounter. Please Disregard.

## 2022-01-29 NOTE — Telephone Encounter (Signed)
Copied from CRM [id number]. Topic: Medications/Prescriptions - Prescription Status/Information  >> Jan 29, 2022  2:23 PM Metro Kung E wrote:  [facility] is calling for the status of the patients medication refill for metFORMIN (GLUCOPHAGE) 500 mg tablet and atorvastatin (LIPITOR) 20 mg tablet. Writer informed her medication is pending approval and request can take 24-48 to complete. [facility] contact [phone]  .

## 2022-01-29 NOTE — Telephone Encounter (Signed)
Clinical research associate received a fax from the [facility] on [address] in [place], requesting a refill of patient's atorvastatin 20 mg tablet prescription AND patient's metformin 500 mg tablet prescription.     Message sent to covering provider.     Medication request routed to Dr Meredith Leeds for processing, as Dr Ignacia Bayley is currently unavailable 01/29/2022 @12 :44 PM

## 2022-01-29 NOTE — Telephone Encounter (Signed)
Refill request routed to Dr. Pedro Earls 01/29/2022 2:58 PM      Routing to grouper CCP unavailable

## 2022-02-12 NOTE — Progress Notes (Deleted)
[facility] Office Note:    Subjective   This visit was conducted with an in-person Spanish interpreter. Tristan Mcbride is a 76 y.o. male with history of HTN, HLD, T2DM, BPH, GERD & age-related hearing loss who presents today with his wife for follow-up. The patient was last seen in our clinic in July 2023, at which time he was feeling well with no specific complaints. He was pending clarification of insurance coverage for consideration of hearing aids.    Today***    Medications & Allergies     Current Outpatient Medications   Medication Sig    metFORMIN (GLUCOPHAGE) 500 mg tablet Take 1 tablet (500 mg total) by mouth 2 times daily (with meals)    atorvastatin (LIPITOR) 20 mg tablet Take 1 tablet (20 mg total) by mouth daily (with dinner)    tamsulosin (FLOMAX) 0.4 mg capsule Take 1 capsule (0.4 mg total) by mouth every evening    gabapentin (NEURONTIN) 300 mg capsule Take 1 capsule (300 mg total) by mouth nightly    naproxen sodium (ANAPROX) 220 mg tablet Take 1 tablet (220 mg total) by mouth as needed  for Pain    lisinopril (PRINIVIL,ZESTRIL) 20 mg tablet Take 1 tablet (20 mg total) by mouth daily    ketoconazole (NIZORAL) 2 % cream Apply topically daily (Patient not taking: Reported on 10/31/2021)    senna (SENOKOT) 8.6 mg tablet Take 2 tablets by mouth daily    blood pressure monitor, automatic with arm cuff Use as directed to monitor blood pressure (Patient not taking: Reported on 01/15/2021)    psyllium (METAMUCIL SMOOTH TEXTURE) 28.3 % POWD powder 1 tablespoon in 8 oz of water daily by mouth.        He is allergic to tetanus toxoids and no known latex allergy.    Past Medical & Surgical History     Past Medical History:   Diagnosis Date    BPH (benign prostatic hypertrophy)     DM (diabetes mellitus) 10/13/2017    Esophageal reflux     Fibrolipoma     intramuscular adipose tissue    Hyperlipidemia     Hypertension     Hypoglycemia     Kidney cyst     Rotator cuff tendinitis     Right    Sexual  dysfunction     absent ejaculation    Type 2 diabetes mellitus     Weight loss      Past Surgical History:   Procedure Laterality Date    KNEE SURGERY      left    LEG TENDON SURGERY      Right     Social & Family History     Family History   Problem Relation Age of Onset    Heart Disease Mother     Diabetes Mother     Diabetes Father     Stroke Father     Cerebral Palsy Father     Breast cancer Maternal Aunt     Colon cancer Neg Hx     Esophageal cancer Neg Hx     Liver cancer Neg Hx     Pancreatic Cancer Neg Hx     Rectal cancer Neg Hx     Stomach cancer Neg Hx      Social History     Socioeconomic History    Marital status: Married   Tobacco Use    Smoking status: Never    Smokeless tobacco: Never   Substance and  Sexual Activity    Alcohol use: No    Drug use: No    Sexual activity: Yes     Partners: Female     Vitals & Physical Exam     Current Vitals Vitals Range (24 Hours)   There were no vitals taken for this visit. BP: ()/()   Arterial Line BP: ()/()      Physical Exam:  General: Patient sitting comfortably in chair. Calm, cooperative, in no acute distress.  Cardiovascular: RRR, no m/r/g, normal S1 and S2.  Pulmonary: NWOB, CTAB.  Neurological: Alert and oriented. Speech is fluent, comprehension is intact.      Assessment & Plan   Tristan Mcbride is a 76 y.o. male with history of HTN, HLD, T2DM, BPH, GERD & age-related hearing loss who presents today with his wife for follow-up.    #Post-Herpetic Neuralgia  -Defer re-prescription of gabapentin.  -Due for second dose of Shingles vaccine.    #Mixed Conductive-Sensorineural Hearing Loss, #Probable Age-Related Presbycusis  -Encouraged patient to call insurance company for hearing aids     #Left Knee Osteoarthritis  -Continue with Tylenol 1000 mg tid.  -Hold off on imaging or Physical Therapy given improvement with above.     #Type 2 Diabetes Mellitus  -Last A1c 6.1% in June 2023, will repeat with next labs.  -Urine microalbumin/creatinine completed January  2023.  -Diabetic foot exam due January 2024.  -Follows with Ophthalmology. Last appointment February 2022, encouraged patient to schedule appointment.  -Follows with Podiatry. Last appointment January 2022, encouraged patient to schedule appointment.     #Hypertension  -Blood pressure slightly above goal.  -Continue with lisinopril 20 mg daily.     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 Repeat scan in August 2021, unchanged risk assessment.  -Continue with tamsulosin 0.4 mg nightly.  -Follows with Urology.    Health Maintenance   Abdominal Aortic Aneurysm (one-time screening in men 65-75 with history of tobacco use) - N/A  Breast Cancer (biennial screening in women 50-74) - N/A  Cervical Cancer (Pap smear every 3 years in women 21-65) - N/A  Colorectal Cancer (colonoscopy every 10 years in adults 45-75) - N/A  Diabetes (annual A1c in overweight or adults >45) - Last A1c 6.1% June 2023  Immunizations - Influenza, COVID, Zoster, Tdap  Lipid Disorder (annual screening in adults with increased CHD risk) - Will recheck with next labs  Lung Cancer (annual screening in adults 50-80 with 20 pack-year history and active smoking within 15 years) - N/A  Osteoporosis (one-time screening in women >65) - N/A    Orders Placed     Orders Placed This Encounter   No orders placed during this encounter.     [name], MD   Signed 02/12/2022 at 8:08 PM

## 2022-02-13 ENCOUNTER — Ambulatory Visit: Payer: 59 | Admitting: Internal Medicine

## 2022-02-16 ENCOUNTER — Encounter: Payer: Self-pay | Admitting: Internal Medicine

## 2022-02-26 ENCOUNTER — Other Ambulatory Visit: Payer: Self-pay | Admitting: Internal Medicine

## 2022-02-26 DIAGNOSIS — I10 Essential (primary) hypertension: Secondary | ICD-10-CM

## 2022-02-26 DIAGNOSIS — N400 Enlarged prostate without lower urinary tract symptoms: Secondary | ICD-10-CM

## 2022-02-26 DIAGNOSIS — Z794 Long term (current) use of insulin: Secondary | ICD-10-CM

## 2022-02-26 DIAGNOSIS — E785 Hyperlipidemia, unspecified: Secondary | ICD-10-CM

## 2022-02-26 MED ORDER — TAMSULOSIN HCL 0.4 MG PO CAPS *I*
0.4000 mg | ORAL_CAPSULE | Freq: Every evening | ORAL | 0 refills | Status: DC
Start: 2022-02-26 — End: 2022-05-22

## 2022-02-26 MED ORDER — ATORVASTATIN CALCIUM 20 MG PO TABS *I*
20.0000 mg | ORAL_TABLET | Freq: Every day | ORAL | 1 refills | Status: DC
Start: 2022-02-26 — End: 2023-03-24

## 2022-02-26 MED ORDER — METFORMIN HCL 500 MG PO TABS *I*
500.0000 mg | ORAL_TABLET | Freq: Two times a day (BID) | ORAL | 3 refills | Status: DC
Start: 2022-02-26 — End: 2023-05-12

## 2022-02-26 MED ORDER — LISINOPRIL 20 MG PO TABS *I*
20.0000 mg | ORAL_TABLET | Freq: Every day | ORAL | 0 refills | Status: DC
Start: 2022-02-26 — End: 2022-06-01

## 2022-02-26 NOTE — Telephone Encounter (Signed)
Copied from CRM [id number]. Topic: Medications/Prescriptions - Refill Request  >> Feb 26, 2022  9:38 AM Uvaldo Bristle wrote:  .Olegario Messier [facility] Pharmacist  calling regarding med refill request for medication   Requested Prescriptions     Pending Prescriptions Disp Refills    atorvastatin (LIPITOR) 20 mg tablet 100 tablet 1     Sig: Take 1 tablet (20 mg total) by mouth daily (with dinner)    metFORMIN (GLUCOPHAGE) 500 mg tablet 360 tablet 3     Sig: Take 1 tablet (500 mg total) by mouth 2 times daily (with meals)    tamsulosin (FLOMAX) 0.4 mg capsule 90 capsule 0     Sig: Take 1 capsule (0.4 mg total) by mouth every evening    lisinopril (PRINIVIL,ZESTRIL) 20 mg tablet 100 tablet 0     Sig: Take 1 tablet (20 mg total) by mouth daily         Has the request already been sent by the Pharmacy via fax or electronically? yes  If yes, what was the date of the original request? 10.16.2023    Please contact Olegario Messier if necessary at [phone].    Olegario Messier stated the patient informed her he is out of his medication and she is asking for the patient to received enough medication to last him to his appointment in December.

## 2022-02-26 NOTE — Telephone Encounter (Signed)
Refill request routed to Dr. Raleigh Callas 02/26/2022 11:42 AM     Routing to grouper CCP unavailable

## 2022-03-25 ENCOUNTER — Ambulatory Visit: Payer: 59 | Admitting: Internal Medicine

## 2022-03-25 NOTE — Progress Notes (Unsigned)
[facility]       Reason for visit: There were no encounter diagnoses.  No diagnosis found.    HPI:  Tristan Mcbride is 76 y.o. year old male with history of  HTN, HLD, T2DM, BPH, GERD & age-related hearing loss who presents today for a follow up visit.   - Last seen at [facility] on 10/31/21.  Post-Herpetic Neuralgia  -Defer re-prescription of gabapentin.  -Will need second dose of Shingles vaccine in 3-6 months.   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  - Insurance coverage for hearing aids.   Left Knee Osteoarthritis  -Continue with Tylenol 1000 mg tid.  -Hold off on imaging or Physical Therapy given improvement with above.   Type 2 Diabetes Mellitus  -Last A1c 6.1% in June 2023.  -Urine microalbumin/creatinine completed January 2023.  -Diabetic foot exam due January 2024.  -Follows with Ophthalmology. Last appointment February 2022, encouraged patient to schedule appointment.  -Follows with Podiatry. Last appointment January 2022, encouraged patient to schedule appointment.  Hypertension  -Blood pressure slightly above goal.  -Continue with lisinopril 20 mg daily.   Benign Prostatic Hyperplasia - MR prostate in August 2020 with two PIRADS assessment category 3 (intermediate) lesions s/p biopsy in September 2020 with one lesion demonstrating high-grade PIN. Repeat scan in August 2021, unchanged risk assessment.  -Continue with tamsulosin 0.4 mg nightly.  -Follows with Urology.              Past Medical History:   Diagnosis Date    BPH (benign prostatic hypertrophy)     DM (diabetes mellitus) 10/13/2017    Esophageal reflux     Fibrolipoma     intramuscular adipose tissue    Hyperlipidemia     Hypertension     Hypoglycemia     Kidney cyst     Rotator cuff tendinitis     Right    Sexual dysfunction     absent ejaculation    Type 2 diabetes mellitus     Weight loss        Medications:     Current Outpatient Medications    Medication Sig    atorvastatin (LIPITOR) 20 mg tablet Take 1 tablet (20 mg total) by mouth daily (with dinner)    metFORMIN (GLUCOPHAGE) 500 mg tablet Take 1 tablet (500 mg total) by mouth 2 times daily (with meals)    tamsulosin (FLOMAX) 0.4 mg capsule Take 1 capsule (0.4 mg total) by mouth every evening    lisinopril (PRINIVIL,ZESTRIL) 20 mg tablet Take 1 tablet (20 mg total) by mouth daily    gabapentin (NEURONTIN) 300 mg capsule Take 1 capsule (300 mg total) by mouth nightly    naproxen sodium (ANAPROX) 220 mg tablet Take 1 tablet (220 mg total) by mouth as needed  for Pain    ketoconazole (NIZORAL) 2 % cream Apply topically daily (Patient not taking: Reported on 10/31/2021)    senna (SENOKOT) 8.6 mg tablet Take 2 tablets by mouth daily    blood pressure monitor, automatic with arm cuff Use as directed to monitor blood pressure (Patient not taking: Reported on 01/15/2021)    psyllium (METAMUCIL SMOOTH TEXTURE) 28.3 % POWD powder 1 tablespoon in 8 oz of water daily by mouth.     No current facility-administered medications for this visit.       Medication list reconciled this visit    Allergies:     Allergies   Allergen Reactions  Tetanus Toxoids      On Amb Int Med note from 08/20/09    No Known Latex Allergy        Social history      Social History     Tobacco Use    Smoking status: Never    Smokeless tobacco: Never   Substance Use Topics    Alcohol use: No       Review of Systems     Review of Systems   Constitutional:  Negative for chills, fever and malaise/fatigue.   Respiratory:  Negative for cough, sputum production and shortness of breath.    Cardiovascular:  Negative for chest pain and palpitations.   Neurological:  Negative for dizziness and headaches.     12 point review of system was negative except those mentioned in HPI    Physical Exam:     Physical Exam  Constitutional:       General: He is not in acute distress.     Appearance: Normal appearance. He is not ill-appearing.   Cardiovascular:       Rate and Rhythm: Normal rate and regular rhythm.      Pulses: Normal pulses.      Heart sounds: Normal heart sounds. No murmur heard.     No friction rub. No gallop.   Pulmonary:      Effort: Pulmonary effort is normal.      Breath sounds: Normal breath sounds. No wheezing or rhonchi.   Skin:     Capillary Refill: Capillary refill takes less than 2 seconds.   Neurological:      Mental Status: He is alert and oriented to person, place, and time.   Psychiatric:         Mood and Affect: Mood normal.         Behavior: Behavior normal.            There were no vitals filed for this visit.  Wt Readings from Last 3 Encounters:   10/31/21 67.9 kg (149 lb 11.2 oz)   10/03/21 68.7 kg (151 lb 6.4 oz)   09/26/21 69.1 kg (152 lb 4.8 oz)     BP Readings from Last 3 Encounters:   10/31/21 142/70   10/03/21 122/63   09/26/21 178/72           ASSESSMENT/PLAN:     There are no diagnoses linked to this encounter.        Follow up: No follow-ups on file.      There are no Patient Instructions on file for this visit.         Signed by [name], NP [facility] 12/13/20237:35 AM

## 2022-03-26 ENCOUNTER — Encounter: Payer: Self-pay | Admitting: Internal Medicine

## 2022-04-01 ENCOUNTER — Encounter: Payer: Self-pay | Admitting: Internal Medicine

## 2022-04-01 NOTE — Progress Notes (Signed)
FYI ONLY    Patient has no showed 4 appointments this year.  If they no show one more appointment they will be dismissed from the practice.

## 2022-04-10 ENCOUNTER — Ambulatory Visit: Payer: 59 | Admitting: Internal Medicine

## 2022-04-17 ENCOUNTER — Ambulatory Visit: Payer: 59 | Admitting: Internal Medicine

## 2022-05-12 ENCOUNTER — Ambulatory Visit: Payer: 59

## 2022-05-12 DIAGNOSIS — Z0000 Encounter for general adult medical examination without abnormal findings: Secondary | ICD-10-CM

## 2022-05-12 NOTE — Progress Notes (Signed)
Medicare Wellness Visit :    Completed via phone with patient ,wife and spanish interpreter    Assessment/Plan     1)Vaccinations: plans to go to local pharmacy for flu and covid vaccines possibly today after work.  Advised that he is also due for shingles and tdap vaccines but will discuss with MD at next appt d/t allergy noted.  2)Fall,Depression, Functional status and Opioid Use Screenings completed and Health Maintenance updated denies fall or depression.  Independent in adls and iadls. Wife assists with meds and housekeeping. Patient still working .  Advised that colonoscopy and eye exam are due. Phone numbers to schedule provided to patient.   3)Exercise: Does not regularly exercise but still working and walks a lot at work.   4)Diet:Eats breakfast, small meal at 10am and 12 and then has evening meal. Snacks on cake and milk in the evening.   5)Medications:meds reviewed. Wife managing meds. Using nizoral cream prn. Not taking naproxen or senna. Does not have a BP cuff.   6)Advance Directives:SIM Staff to mail HCP form to patient to complete and bring to next appointment to be scanned into erecord.     Next [facility] Appointment:06/05/22  Next Subsequent Medicare Wellness visit to be scheduled by Care management .  Visit performed as:             Telephone for Medicare [employer] and [employer]     Mode of Communication with Patient for This Visit    Mode of Communication with Patient for This Visit: Audio Only  Audio Only: Reason: Patient prefers Audio Only  Patient Location: Home  Provider Location: Home         The service provided today can be effectively delivered without a visual or in-person component and includes all clinically appropriate documentation requirements as if provided in-person. Audio-visual will continue to be an option for this patient as well as in-person visits.   Other participants in telemedicine encounter and roles: wife and spanish  interpreter    Consent was obtained from the patient to complete this telephone visit; including the potential for financial liability.    This visit was performed during a pandemic event and the vital signs including BMI and physical exam are limited or missing due to the patient's location.  _____________________________________________________________________    Today we reviewed and updated Tristan Mcbride's smoking status, activities of daily living, depression screen, fall risk, medications and allergies.   I have counseled the patient in the above areas.     Subjective:     Chief Complaint: Tristan Mcbride is a 77 y.o. male here for a/an Initial Annual Medicare visit    In general, Tristan Mcbride rates their overall health as:  fair      Patient Care Team:  [name], MD as PCP - General  [name], MD as PCP - [facility]  [name], NP as Nurse Practitioner (Internal Medicine)  Genia Del, PhD (Audiology)  Jacqualine Mau, MD  Jearld Fenton, OD (Ophthalmology)     Current Outpatient Medications on File Prior to Visit   Medication Sig Dispense Refill    atorvastatin (LIPITOR) 20 mg tablet Take 1 tablet (20 mg total) by mouth daily (with dinner) 100 tablet 1    metFORMIN (GLUCOPHAGE) 500 mg tablet Take 1 tablet (500 mg total) by mouth 2 times daily (with meals) 360 tablet 3    tamsulosin (FLOMAX) 0.4 mg capsule Take 1 capsule (0.4  mg total) by mouth every evening 90 capsule 0    lisinopril (PRINIVIL,ZESTRIL) 20 mg tablet Take 1 tablet (20 mg total) by mouth daily 100 tablet 0    ketoconazole (NIZORAL) 2 % cream Apply topically daily 30 g 2    psyllium (METAMUCIL SMOOTH TEXTURE) 28.3 % POWD powder 1 tablespoon in 8 oz of water daily by mouth. 1 Bottle 3    naproxen sodium (ANAPROX) 220 mg tablet Take 1 tablet (220 mg total) by mouth as needed  for Pain (Patient not taking: Reported on 05/12/2022) 30 tablet 1    senna (SENOKOT) 8.6 mg tablet Take 2 tablets by mouth  daily (Patient not taking: Reported on 05/12/2022) 90 tablet 1    blood pressure monitor, automatic with arm cuff Use as directed to monitor blood pressure (Patient not taking: Reported on 01/15/2021) 1 kit 0    [DISCONTINUED] insulin glargine (LANTUS SOLOSTAR) 100 unit/mL injection pen Inject 6 Units into the skin nightly 3 mL 2     No current facility-administered medications on file prior to visit.     Allergies   Allergen Reactions    Tetanus Toxoids      On Amb Int Med note from 08/20/09    No Known Latex Allergy      Patient Active Problem List    Diagnosis Date Noted    DM (diabetes mellitus) 10/13/2017    Hyperglycemia 10/04/2017    Elevated PSA 06/05/2010     - PSA has increased from 7.91 in 2015 to 10.17 in February 2020  - He previously underwent a biopsy in 2012 that was negative for cancer  - He would be interested in prostate cancer treatment if cancer was found and he appears to be healthy  - MRI was performed and showed 2 PI-RADS 3 lesions.  -He underwent a uro-nab biopsy in September 2020.  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  -Follow-up in 6 months with PSA prior to visit.        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     Created by Conversion        Hypoglycemia 11/06/2003     Created by Conversion        Hyperlipidemia 11/06/2003     Created by Conversion        Essential Hypertension 11/06/2003     Created by Conversion        Esophageal Reflux 11/06/2003      Created by Conversion         Past Medical History:   Diagnosis Date    BPH (benign prostatic hypertrophy)     DM (diabetes mellitus) 10/13/2017    Esophageal reflux     Fibrolipoma     intramuscular adipose tissue    Hyperlipidemia     Hypertension     Hypoglycemia     Kidney  cyst     Rotator cuff tendinitis     Right    Sexual dysfunction     absent ejaculation    Type 2 diabetes mellitus     Weight loss      Past Surgical History:   Procedure Laterality Date    KNEE SURGERY      left    LEG TENDON SURGERY      Right     Family History   Problem Relation Age of Onset    Heart Disease Mother     Diabetes Mother     Diabetes Father     Stroke Father     Cerebral Palsy Father     Breast cancer Maternal Aunt     Colon cancer Neg Hx     Esophageal cancer Neg Hx     Liver cancer Neg Hx     Pancreatic Cancer Neg Hx     Rectal cancer Neg Hx     Stomach cancer Neg Hx      Social History     Socioeconomic History    Marital status: Married   Tobacco Use    Smoking status: Never    Smokeless tobacco: Never   Vaping Use    Vaping Use: Never used   Substance and Sexual Activity    Alcohol use: No    Drug use: No    Sexual activity: Yes     Partners: Female       Objective:     Vital Signs:  from office visit 10/31/21  BP 142/70  Height 5'7"  Weight 149lbs  BMI: 23.45    Vision Screening Results (Welcome visit only):  No results found.    Depression Screening Results:  Review Flowsheet          05/12/2022 10/03/2021 02/17/2017   PHQ Scores   PHQ Calculated Score 0 0 1   No questionnaires on file.   Opioid Use/DAST- 10 Screening Results:   How many times in the past year have you used an illegal drug or used a prescription medication for nonmedical reasons?: 0 (05/12/2022 12:46 PM)    Activities of Daily Living/Functional Screening Results:  Is the person deaf or does he/she have serious difficulty hearing?: Y (05/12/2022 12:42 PM)  Hearing Status: Hearing loss in right ear (05/12/2022 12:42 PM)  Is this person blind or does he/she  have serious difficulty seeing even when wearing glasses?: N (05/12/2022 12:42 PM)  Does this person have serious difficulty walking or climbing stairs?: N (05/12/2022 12:42 PM)  Does this person have difficulty dressing or bathing?: N (05/12/2022 12:42 PM)  *Shopping: Independent (05/12/2022 12:42 PM)  *House Keeping: Independent (05/12/2022 12:42 PM)  *Managing Own Medications: Needs Assistance (05/12/2022 12:42 PM)  If you need help, who helps you?: wife assists with meds , housekeeping (05/12/2022 12:42 PM)  Difficulty doing errands due to a physicial, mental or emotional condition: No (05/12/2022 12:42 PM)  Difficulty remembering or making decisions due to a physicial, mental or emotional condition: No (05/12/2022 12:42 PM)      Fall Risk Screening Results:  Have you fallen in the last year?: No (05/12/2022 12:41 PM)  Do you feel you are at risk for falling?: No (05/12/2022 12:41 PM)      Assessment and Plan:     Cognitive Function:  Recall of recent and remote events appears:  Normal      Advanced Care Planning:  SIM Staff to mail HCP form to patient to  complete and bring to next appointment to be scanned into erecord.     The following health maintenance plan was reviewed with the patient:    Health Maintenance Topics with due status: Overdue       Topic Date Due    IMM DTaP/Tdap/Td Never done    IMM-ZOSTER Never done    Colon Cancer Screening Other 02/21/2020    IMM-INFLUENZA 12/12/2021    COVID-19 Vaccine 12/12/2021    Diabetic A1C Monitoring ADA 04/04/2022    Diabetic Foot Exam ADA 05/05/2022    Diabetic Nephropathy Screening - Urine 05/07/2022     Health Maintenance Topics with due status: Due Soon       Topic Date Due    Diabetic Eye Exam ADA 05/22/2022     Health Maintenance Topics with due status: Not Due       Topic Last Completion Date    Diabetic Nephropathy Screening - Blood 10/03/2021    Depression Screen Yearly 05/12/2022    Fall Risk Screening 05/12/2022     Health Maintenance Topics with due status:  Completed       Topic Last Completion Date    Hepatitis C Screening [place] 04/15/2017    HIV Screening [place] 11/02/2018    IMM Pneumo: 65+ Years 10/02/2020     Health Maintenance Topics with due status: Aged [employer] Date Due    IMM-Hepatitis B Vaccine Aged Out    IMM-ROTAVIRUS 0-8 MOS Aged Out     This health maintenance schedule, identified risks, a list of orders placed today and patient goals have been provided to [facility] in the after visit summary.     Plan for any concerns identified during screening or risk assessments:  n/a

## 2022-05-12 NOTE — Patient Instructions (Signed)
Thank you for completing your Initial Annual Medicare visit   with [place] today.     The purpose of this visits was to:    Screen for disease  Assess risk of future medical problems  Help develop a healthy lifestyle  Update vaccines  Get to know your doctor in case of an illness    Patient Care Team:  [name], MD as PCP - General  [name], MD as PCP - [facility]  [name], NP as Nurse Practitioner (Internal Medicine)  Genia Del, PhD (Audiology)  Jacqualine Mau, MD  Jearld Fenton, OD (Ophthalmology)     Medicare 5 Year Plan    The following items were identified as areas of concern during your screening today:  Diabetes - This is a risk factor for Heart Attack, Stroke, Kidney Problems, Eye Problems and a loss of feeling in your fingers and feet.   High Blood Pressure (Hypertension) - This is a risk factor for Heart Attack, Stroke, Kidney Problems and Eye Problems.       The Health Maintenance table below identifies screening tests and immunizations recommended by your health care team:  Health Maintenance: These screening recommendations are based on [place]PSTF, [employer], and [place] state guidelines   Topic Date Due   . DTaP/Tdap/Td Vaccines (1 - Tdap) Never done   . Shingles Vaccine (1 of 2) Never done   . Colon Cancer Screening  02/21/2020   . Flu Shot (1) 12/12/2021   . COVID-19 Vaccine (5 - 2023-24 season) 12/12/2021   . Diabetic A1C Monitoring  04/04/2022   . Diabetic Foot Exam  05/05/2022   . Diabetic Kidney Disease Urine  05/07/2022   . Diabetic Eye Exam  05/22/2022   . Diabetic Kidney Disease Blood  10/04/2022   . Depression Screen Yearly  05/13/2023   . Fall Risk Screening  05/13/2023   . HIV Screening  Completed   . Hepatitis C Screening  Completed   . Pneumococcal Vaccination  Completed   . Hepatitis B Vaccine  Aged Out   . Rotavirus Vaccine  Aged Out     In addition, goals and orders placed to address these recommendations are listed in  the "Today's Visit" section.    We wish you the best of health and look forward to seeing you again next year for your Annual Medicare Wellness Visit.     If you have any health care concerns before then, please do not hesitate to contact us.

## 2022-05-14 ENCOUNTER — Telehealth: Payer: Self-pay

## 2022-05-14 NOTE — Telephone Encounter (Signed)
Copied from CRM #[id number]. Topic: Appointments - Schedule Appointment  >> May 14, 2022  9:01 AM Melvenia Beam wrote:  Spoke with Suzan Nailer , patient, regarding scheduling of Colonoscopy, details listed below:    --Writer unable to schedule. Template not available.  --Clinical research associate used interpreter services, Angie // [phone]  --Patient would like to go to [facility] for Colonoscopy    Are you on any blood thinners? no   If yes, who prescribes them and what is their phone number?  n/a  Do you have an automated defibrillator? NO    Do you have an LVAD? NO     Do you have a tracheostomy? NO    Do you use BIPAP or oxygen at home? NO    For scheduling safety precautions, is the patient's BMI under 45? Estimated body mass index is 23.45 kg/m as calculated from the following:    Height as of 10/03/21: 1.702 m (5\' 7" ).    Weight as of 10/31/21: 67.9 kg (149 lb 11.2 oz).    YES        TO ENSURE APPROPRIATE CLEANSE PREPARATION FOR COLONOSCOPY SCHEDULING ONLY:    Are you diabetic? NO    Do you move your bowels daily or every other day? YES  - [facility] WILL BE SENT FOR NON DIABETIC PATIENTS.    Have you been diagnosed with kidney disease? NO    FOR ENDOSCOPY (includes CEN) SCHEDULING ONLY  Have you ever had banding for esophageal varices? NOT APPLICABLE.    NEW** Please double check if the patient has VA benefits they are using for this exam:      No, patient reported, they do not have VA benefits    Patient requested prep instructions sent via: MAILED

## 2022-05-22 ENCOUNTER — Other Ambulatory Visit: Payer: Self-pay | Admitting: Internal Medicine

## 2022-05-22 DIAGNOSIS — N400 Enlarged prostate without lower urinary tract symptoms: Secondary | ICD-10-CM

## 2022-05-22 MED ORDER — TAMSULOSIN HCL 0.4 MG PO CAPS *I*
0.4000 mg | ORAL_CAPSULE | Freq: Every evening | ORAL | 0 refills | Status: DC
Start: 2022-05-22 — End: 2022-08-13

## 2022-05-22 NOTE — Telephone Encounter (Signed)
Refill request routed to Dr. Ignacia Bayley 05/22/2022 1:50 PM

## 2022-06-01 ENCOUNTER — Other Ambulatory Visit: Payer: Self-pay | Admitting: Internal Medicine

## 2022-06-01 DIAGNOSIS — I10 Essential (primary) hypertension: Secondary | ICD-10-CM

## 2022-06-01 NOTE — Telephone Encounter (Signed)
Medication request routed to [facility] for processing 06/01/2022 @4 :21 PM

## 2022-06-02 MED ORDER — LISINOPRIL 20 MG PO TABS *I*
20.0000 mg | ORAL_TABLET | Freq: Every day | ORAL | 0 refills | Status: DC
Start: 2022-06-02 — End: 2022-08-31

## 2022-06-04 NOTE — Progress Notes (Signed)
[facility] Office Note:    Subjective   This visit was conducted with an in-person Spanish interpreter. Tristan Mcbride is a 77 y.o. male with history of HTN, HLD, T2DM, BPH, GERD & age-related hearing loss who presents today with his wife for follow-up.     The patient was last seen in our clinic in January 2024 for Medicare Wellness Visit. At that time, he was intended to get COVID and Influenza vaccines, though the lines were too long. Shingles and Tdap vaccines were deferred to this clinic visit. The patient was given an HCP form to complete. Since he was last seen, Dwyne attempted to schedule colonoscopy, though was seemingly unable to do so. [place] states that he was waiting for them to send him an appointment.     Massimo states that he continues to experience an intermittent buzzing sensation in his left ear. This has been bothering for him for quite some time. He did undergo audiogram one year ago, which demonstrated moderate sensorineural hearing loss in the left ear and profound sensorineural hearing loss in the right ear. Hearing aids are unfortunately not covered through [facility], and he has been able to contact his insurance company regarding this. His wife states that she is not sure how much this will cost. This is his only complaint. Knee pain continues to be well-managed with Tylenol. He has had no peripheral numbness or tingling, and no foot wounds. He does not want to go back to the same Podiatrist. He has not seen an Ophthalmologist in 2 years.    Medications & Allergies     Current Outpatient Medications   Medication Sig    lisinopril (PRINIVIL,ZESTRIL) 20 mg tablet Take 1 tablet (20 mg total) by mouth daily    tamsulosin (FLOMAX) 0.4 mg capsule Take 1 capsule (0.4 mg total) by mouth every evening    atorvastatin (LIPITOR) 20 mg tablet Take 1 tablet (20 mg total) by mouth daily (with dinner)    metFORMIN (GLUCOPHAGE) 500 mg tablet Take 1 tablet (500 mg total) by mouth 2 times daily  (with meals)    psyllium (METAMUCIL SMOOTH TEXTURE) 28.3 % POWD powder 1 tablespoon in 8 oz of water daily by mouth.    naproxen sodium (ANAPROX) 220 mg tablet Take 1 tablet (220 mg total) by mouth as needed  for Pain (Patient not taking: Reported on 05/12/2022)    ketoconazole (NIZORAL) 2 % cream Apply topically daily (Patient not taking: Reported on 06/05/2022)    senna (SENOKOT) 8.6 mg tablet Take 2 tablets by mouth daily (Patient not taking: Reported on 05/12/2022)    blood pressure monitor, automatic with arm cuff Use as directed to monitor blood pressure (Patient not taking: Reported on 01/15/2021)        He is allergic to tetanus toxoids and no known latex allergy.    Past Medical & Surgical History     Past Medical History:   Diagnosis Date    BPH (benign prostatic hypertrophy)     DM (diabetes mellitus) 10/13/2017    Esophageal reflux     Fibrolipoma     intramuscular adipose tissue    Hyperlipidemia     Hypertension     Hypoglycemia     Kidney cyst     Rotator cuff tendinitis     Right    Sexual dysfunction     absent ejaculation    Type 2 diabetes mellitus     Weight loss      Past  Surgical History:   Procedure Laterality Date    KNEE SURGERY      left    LEG TENDON SURGERY      Right     Social & Family History     Family History   Problem Relation Age of Onset    Heart Disease Mother     Diabetes Mother     Diabetes Father     Stroke Father     Cerebral Palsy Father     Breast cancer Maternal Aunt     Colon cancer Neg Hx     Esophageal cancer Neg Hx     Liver cancer Neg Hx     Pancreatic Cancer Neg Hx     Rectal cancer Neg Hx     Stomach cancer Neg Hx      Social History     Socioeconomic History    Marital status: Married   Tobacco Use    Smoking status: Never    Smokeless tobacco: Never   Vaping Use    Vaping Use: Never used   Substance and Sexual Activity    Alcohol use: No    Drug use: No    Sexual activity: Yes     Partners: Female     Vitals & Physical Exam     Current Vitals Vitals Range (24 Hours)    BP 124/59 (BP Location: Left arm, Patient Position: Sitting, Cuff Size: adult)   Pulse 74   Temp 36.2 C (97.1 F) (Temporal)   Wt 71 kg (156 lb 9.6 oz)   SpO2 94%   BMI 24.53 kg/m  Temp:  [36.2 C (97.1 F)] 36.2 C (97.1 F)  Heart Rate:  [74] 74  BP: (124)/(59) 124/59     Physical Exam:  General: Patient sitting comfortably in chair. Calm, cooperative, in no acute distress.  Cardiovascular: RRR, no m/r/g, normal S1 and S2.  Pulmonary: NWOB, CTAB.  Neurological: Alert and oriented. Speech is fluent, comprehension is intact.      Tristan Mcbride is a 77 y.o. male with history of HTN, HLD, T2DM, BPH, GERD & age-related hearing loss who presents today with his wife for follow-up.     #Type 2 Diabetes Mellitus  -Last A1c 6.1% in June 2023, repeat A1c today.  -Urine microalbumin/creatinine today.  -Diabetic foot exam completed today.  -Follows with Ophthalmology, patient provided number to reschedule.  -Follows with Podiatry. New referral placed per patient request.    #Hypertension  -Blood pressure at goal.  -Continue with lisinopril 20 mg daily.    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 Repeat scan in August 2021, unchanged risk assessment.  -Repeat PSA today.  -Continue with tamsulosin 0.4 mg nightly.  -Follows with Urology, patient provided number to reschedule.    #Mild Hypercalcemia  -Repeat BMP today.    #Elevated Red Blood Cell Distribution Width  -Repeat CBC with iron studies & B12 today.     #Left Knee Osteoarthritis  -Continue with Tylenol 1000 mg tid.  -Hold off on imaging or Physical Therapy given improvement with above.    #Chronic Kidney Disease, Stage 3A  -Follow BMP.    Health Maintenance   Abdominal Aortic Aneurysm (one-time screening in men 65-75 with history of tobacco use) - N/A  Breast Cancer (biennial screening in women  50-74) - N/A  Cervical Cancer (  Pap smear every 3 years in women 21-65) - N/A  Colorectal Cancer (colonoscopy every 10 years in adults 45-75) - Will attempt to schedule this  Diabetes (annual A1c in overweight or adults >45) - Last A1c 6.1% June 2023, repeat today  Immunizations - Influenza (will get today), COVID (will get today), Zoster, Tdap  Lipid Disorder (annual screening in adults with increased CHD risk) - Will recheck today  Lung Cancer (annual screening in adults 50-80 with 20 pack-year history and active smoking within 15 years) - N/A  Osteoporosis (one-time screening in women >65) - N/A    Orders Placed     Orders Placed This Encounter    COVID-19 mRNA 2023-24 vaccine (Moderna SPIKEVAX) 12 years+    Flu Vaccine, Quad, Adjuvanted(>=52Yrs)    BASIC METABOLIC PANEL    CBC and differential    TIBC    Ferritin    Transferrin    Hemoglobin A1c    Microalbumin, Urine, Random    Lipid Panel (Reflex to Direct  LDL if Triglycerides more than 400)    PSA (eff.07-2008)    Vitamin B12    AMB REFERRAL TO PODIATRY - [facility]    [facility] DIABETES FOOT EXAM     Liliane Channel, MD   Signed 06/05/2022 at 9:47 AM

## 2022-06-05 ENCOUNTER — Ambulatory Visit: Payer: 59 | Attending: Internal Medicine | Admitting: Internal Medicine

## 2022-06-05 ENCOUNTER — Other Ambulatory Visit
Admission: RE | Admit: 2022-06-05 | Discharge: 2022-06-05 | Disposition: A | Discharge status: Home / Self Care | Payer: 59 | Source: Ambulatory Visit

## 2022-06-05 ENCOUNTER — Other Ambulatory Visit: Payer: Self-pay

## 2022-06-05 ENCOUNTER — Encounter: Payer: Self-pay | Admitting: Internal Medicine

## 2022-06-05 VITALS — BP 124/59 | HR 74 | Temp 97.1°F | Wt 156.6 lb

## 2022-06-05 DIAGNOSIS — E119 Type 2 diabetes mellitus without complications: Secondary | ICD-10-CM | POA: Insufficient documentation

## 2022-06-05 DIAGNOSIS — Z23 Encounter for immunization: Secondary | ICD-10-CM | POA: Insufficient documentation

## 2022-06-05 DIAGNOSIS — N400 Enlarged prostate without lower urinary tract symptoms: Secondary | ICD-10-CM | POA: Insufficient documentation

## 2022-06-05 DIAGNOSIS — E538 Deficiency of other specified B group vitamins: Secondary | ICD-10-CM | POA: Insufficient documentation

## 2022-06-05 DIAGNOSIS — Z125 Encounter for screening for malignant neoplasm of prostate: Secondary | ICD-10-CM | POA: Insufficient documentation

## 2022-06-05 DIAGNOSIS — Z130 Encounter for screening for diseases of the blood and blood-forming organs and certain disorders involving the immune mechanism: Secondary | ICD-10-CM | POA: Insufficient documentation

## 2022-06-05 LAB — LIPID PANEL
Chol/HDL Ratio: 2.5
Cholesterol: 127 mg/dL
HDL: 50 mg/dL (ref 40–60)
LDL Calculated: 68 mg/dL
Non HDL Cholesterol: 77 mg/dL
Triglycerides: 47 mg/dL

## 2022-06-05 LAB — CBC AND DIFFERENTIAL
Baso # K/uL: 0.1 10*3/uL (ref 0.0–0.2)
Basophil %: 0.6 %
Eos # K/uL: 0.3 10*3/uL (ref 0.0–0.5)
Eosinophil %: 2.7 %
Hematocrit: 43 % (ref 37–52)
Hemoglobin: 13.4 g/dL (ref 12.0–17.0)
IMM Granulocytes #: 0 10*3/uL (ref 0.0–0.0)
IMM Granulocytes: 0.3 %
Lymph # K/uL: 3.4 10*3/uL (ref 1.0–5.0)
Lymphocyte %: 31.5 %
MCH: 27 pg (ref 26–32)
MCHC: 31 g/dL — ABNORMAL LOW (ref 32–37)
MCV: 87 fL (ref 75–100)
Mono # K/uL: 0.9 10*3/uL (ref 0.1–1.0)
Monocyte %: 8.6 %
Neut # K/uL: 6.1 10*3/uL (ref 1.5–6.5)
Nucl RBC # K/uL: 0 10*3/uL (ref 0.0–0.0)
Nucl RBC %: 0 /100 WBC (ref 0.0–0.2)
Platelets: 234 10*3/uL (ref 150–450)
RBC: 4.9 MIL/uL (ref 4.0–6.0)
RDW: 14.8 % (ref 0.0–15.0)
Seg Neut %: 56.3 %
WBC: 10.8 10*3/uL (ref 3.5–11.0)

## 2022-06-05 LAB — FERRITIN: Ferritin: 212 ng/mL (ref 20–250)

## 2022-06-05 LAB — TIBC
Iron: 78 ug/dL (ref 45–170)
TIBC: 285 ug/dL (ref 250–450)
Transferrin Saturation: 27 % (ref 20–55)

## 2022-06-05 LAB — BASIC METABOLIC PANEL
Anion Gap: 13 (ref 7–16)
CO2: 23 mmol/L (ref 20–28)
Calcium: 10.6 mg/dL — ABNORMAL HIGH (ref 8.6–10.2)
Chloride: 105 mmol/L (ref 96–108)
Creatinine: 1.33 mg/dL — ABNORMAL HIGH (ref 0.67–1.17)
Glucose: 64 mg/dL (ref 60–99)
Lab: 29 mg/dL — ABNORMAL HIGH (ref 6–20)
Potassium: 4.8 mmol/L (ref 3.3–5.1)
Sodium: 141 mmol/L (ref 133–145)
eGFR BY CREAT: 56 * — AB

## 2022-06-05 LAB — PSA (EFF.4-2010): PSA (eff. 4-2010): 9.46 ng/mL — ABNORMAL HIGH (ref 0.00–4.00)

## 2022-06-05 LAB — MICROALBUMIN, URINE, RANDOM
Creatinine,UR: 88 mg/dL (ref 20–300)
Microalb/Creat Ratio: 75 mg MA/g CR — ABNORMAL HIGH (ref 0.0–29.9)
Microalbumin,UR: 6.6 mg/dL

## 2022-06-05 LAB — VITAMIN B12: Vitamin B12: 644 pg/mL (ref 232–1245)

## 2022-06-05 LAB — TRANSFERRIN: Transferrin: 211 mg/dL (ref 200–360)

## 2022-06-05 NOTE — Result Encounter Note (Signed)
BMP unremarkable, kidney function at baseline. Calcium persistently elevated, will check PTH, PTHrP & VD with next labs. CBC unremarkable. Iron studies unremarkable.

## 2022-06-05 NOTE — Patient Instructions (Addendum)
1. Llame al [phone] para programar una cita con el oftalmlogo.    2. Llame al [phone] para programar una cita con el urlogo.    3. Me comunicar con su gastroenterlogo para intentar programar una colonoscopia.    4. Estamos trabajando para conseguir tus audfonos. Lo hablar con su audilogo.

## 2022-06-07 LAB — HEMOGLOBIN A1C: Hemoglobin A1C: 6.1 % — ABNORMAL HIGH

## 2022-06-08 NOTE — Addendum Note (Signed)
Addended byLiliane Channel on: 06/08/2022 12:16 PM     Modules accepted: Orders

## 2022-07-07 ENCOUNTER — Ambulatory Visit: Payer: 59 | Admitting: Foot & Ankle Surgery

## 2022-07-09 ENCOUNTER — Other Ambulatory Visit: Payer: Self-pay | Admitting: Urology

## 2022-07-09 DIAGNOSIS — R3989 Other symptoms and signs involving the genitourinary system: Secondary | ICD-10-CM

## 2022-07-10 ENCOUNTER — Ambulatory Visit: Payer: 59 | Admitting: Urology

## 2022-08-13 ENCOUNTER — Other Ambulatory Visit: Payer: Self-pay | Admitting: Internal Medicine

## 2022-08-13 DIAGNOSIS — N400 Enlarged prostate without lower urinary tract symptoms: Secondary | ICD-10-CM

## 2022-08-13 NOTE — Telephone Encounter (Signed)
Fax received for refill. Medication request routed to Dr. Ignacia Bayley for processing 08/13/2022 @3 :37 PM

## 2022-08-15 MED ORDER — TAMSULOSIN HCL 0.4 MG PO CAPS *I*
0.4000 mg | ORAL_CAPSULE | Freq: Every evening | ORAL | 0 refills | Status: DC
Start: 2022-08-15 — End: 2022-11-09

## 2022-08-20 ENCOUNTER — Other Ambulatory Visit: Payer: Self-pay | Admitting: Student in an Organized Health Care Education/Training Program

## 2022-08-20 DIAGNOSIS — R3989 Other symptoms and signs involving the genitourinary system: Secondary | ICD-10-CM

## 2022-08-20 NOTE — Progress Notes (Signed)
08/21/2022    RE: Tristan Mcbride  DOB: 01-07-1946  MRN: [id number]    Dear Dr. Peter Minium,     Today, I had the pleasure of seeing Tristan Mcbride in the urologic surgery clinic.    As you are aware, he is a 77 y.o. male who has a long-standing history of elevated PSA status post 2 prior biopsies with HGPIN. The most recent biopsy was in 2020 and was a uronav biopsy targeting 2 PIRADS-3 lesions in a 143 cc gland. His PSA has remained stable since 2020, most recently 9.46 in February from 10.17 in 2020. He has no family history of prostate cancer and states that his urinary symptoms are well controlled. He has had some recent issues with ED and would like to try medication.     PAST MEDICAL HISTORY:  BPH  DM  GERD  HLD  HTN  Renal cyst  DM  CKD    PAST SURGICAL HISTORY:  Knee surgery     PHYSICAL EXAM:  Vitals:  vitals were not taken for this visit.   Pain Score:   0 - No pain      He is alert and oriented. His face is symmetric, sclerae anicteric  He has no palpable cervical or supraclavicular lymphadenopathy.  His respirations are unlabored.  His heart rate and rhythm are regular.  His abdomen is soft and nondistended, without any palpable masses or hernias. His groin is without evidence of hernia, penis and meatus normal, scrotum normal, testis are normal in size, texture, without palpable masses or tenderness, perineum is palpably normal. His rectal exam today is notable for normal anal tone, no palpable masses, prostate is enlarged, smooth, symmetric, and without tenderness. His lower extremities are warm and without any edema.      IMAGING:  MR prostate with and without contrast   11/17/19  2.6 cm lesion in the left mid and apical anterior transition zone with signal characteristics corresponding to: PIRADS assessment category 3, Intermediate (the presence of clinically significant cancer is equivocal).   1.4 cm lesion in the left apical posterior transition zone with signal characteristics corresponding to: PIRADS assessment  category 2, Low (clinically significant cancer is unlikely to be present).   Prostate Size: 144.56 cm     IMPRESSION:  In summary, Tristan Mcbride is a 77 y.o. male with a history of LUTS and elevated PSA status post two prior negative biopsies. His PSA is stable from 4 years ago when his last biopsy was done and despite having a PSA of  9.46 he does have a favorable PSA density of 0.066. I don't think this warrants another biopsy necessarily but I think it is reasonable to obtain a repeat prostate MR since it has been 3 years since his last one. Also we will start a trial of Cialis 5 mg daily as needed. He was instructed to avoid fatty foods beforehand and advised on possible side effects including headache, dizziness, flushing, tinted vision.       PLAN:  - Cilais 5 mg daily PRN  - Follow up in 4 months with PSA and MR prostate    It is a pleasure to participate in his care. Should you have any questions, feel free to contact me at any time.     Respectfully yours,      Talmadge Coventry. Ryli Standlee MD, PhD  Assistant Professor of Urology  Department of Urology  [facility]

## 2022-08-21 ENCOUNTER — Other Ambulatory Visit: Payer: Self-pay

## 2022-08-21 ENCOUNTER — Ambulatory Visit: Payer: 59 | Admitting: Student in an Organized Health Care Education/Training Program

## 2022-08-21 ENCOUNTER — Encounter: Payer: Self-pay | Admitting: Student in an Organized Health Care Education/Training Program

## 2022-08-21 DIAGNOSIS — R3989 Other symptoms and signs involving the genitourinary system: Secondary | ICD-10-CM

## 2022-08-21 DIAGNOSIS — R972 Elevated prostate specific antigen [PSA]: Secondary | ICD-10-CM

## 2022-08-21 LAB — POCT URINALYSIS DIPSTICK
Blood,UA POCT: NEGATIVE
Glucose,UA POCT: NORMAL mg/dL
Ketones,UA POCT: NEGATIVE mg/dL
Leuk Esterase,UA POCT: NEGATIVE
Lot #: 70153302
Nitrite,UA POCT: NEGATIVE
PH,UA POCT: 7 (ref 5–8)
Specific gravity,UA POCT: 1.015 (ref 1.002–1.030)

## 2022-08-21 MED ORDER — TADALAFIL 5 MG PO TABS *I*
5.0000 mg | ORAL_TABLET | Freq: Every day | ORAL | 3 refills | Status: DC
Start: 2022-08-21 — End: 2024-02-25

## 2022-08-31 ENCOUNTER — Other Ambulatory Visit: Payer: Self-pay | Admitting: Internal Medicine

## 2022-08-31 DIAGNOSIS — I10 Essential (primary) hypertension: Secondary | ICD-10-CM

## 2022-08-31 MED ORDER — LISINOPRIL 20 MG PO TABS *I*
20.0000 mg | ORAL_TABLET | Freq: Every day | ORAL | 0 refills | Status: DC
Start: 2022-08-31 — End: 2022-12-04

## 2022-08-31 NOTE — Telephone Encounter (Signed)
Fax received for refill. Medication request routed to Dr. Farris Has for processing 08/31/2022 @2 :15 PM

## 2022-09-23 NOTE — Progress Notes (Signed)
[facility] Office Note:    Subjective   This visit was conducted with an in-person Spanish interpreter. Tristan Mcbride is a 77 y.o. male with history of HTN, HLD, T2DM, CKD3A, BPH with LUTS, GERD & age-related hearing loss who presents today with his wife for evaluation of right-sided shoulder pain.    The patient was last seen in our clinic in February 2024 for diabetes follow-up and evaluation of mild hypercalcemia. Since he was last seen in our clinic, Tristan Mcbride was able to follow-up with Urology, at which time he was initiated on therapy with Cialis with plan for prostatic MRI in 4 months. He was able to complete repeat blood work, which showed persistent mild hypercalcemia.    Today, Tristan Mcbride states that he has been having right shoulder pain for the last few days. He states that when he raises his arm, the top of his shoulder hurts. Pain occurs intermittently, and does not overly limit his movement. He has been taking Tylenol, and feels this has been helping. He states that he did break the ipsilateral wrist when he was younger, but denies any right shoulder injury. 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 He states that he used to get injections in this shoulder for similar pain years ago.    Medications & Allergies     Current Outpatient Medications   Medication Sig    lisinopril (PRINIVIL,ZESTRIL) 20 mg tablet Take 1 tablet (20 mg total) by mouth daily.    tadalafil (CIALIS) 5 MG tablet Take 1 tablet (5 mg total) by mouth daily.    tamsulosin (FLOMAX) 0.4 mg capsule Take 1 capsule (0.4 mg total) by mouth every evening.    atorvastatin (LIPITOR) 20 mg tablet Take 1 tablet (20 mg total) by mouth daily (with dinner)    metFORMIN (GLUCOPHAGE) 500 mg tablet Take 1 tablet (500 mg total) by mouth 2 times daily (with meals)    ketoconazole (NIZORAL) 2 % cream Apply topically daily    senna (SENOKOT) 8.6 mg tablet Take 2 tablets by mouth daily    blood  pressure monitor, automatic with arm cuff Use as directed to monitor blood pressure    psyllium (METAMUCIL SMOOTH TEXTURE) 28.3 % POWD powder 1 tablespoon in 8 oz of water daily by mouth.        He is allergic to tetanus toxoids and no known latex allergy.    Past Medical & Surgical History     Past Medical History:   Diagnosis Date    BPH (benign prostatic hypertrophy)     DM (diabetes mellitus) 10/13/2017    Esophageal reflux     Fibrolipoma     intramuscular adipose tissue    Hyperlipidemia     Hypertension     Hypoglycemia     Kidney cyst     Rotator cuff tendinitis     Right    Sexual dysfunction     absent ejaculation    Type 2 diabetes mellitus     Weight loss      Past Surgical History:   Procedure Laterality Date    KNEE SURGERY      left    LEG TENDON SURGERY      Right     Social & Family History     Family History   Problem Relation Age of Onset    Heart Disease Mother     Diabetes Mother  Diabetes Father     Stroke Father     Cerebral Palsy Father     Breast cancer Maternal Aunt     Colon cancer Neg Hx     Esophageal cancer Neg Hx     Liver cancer Neg Hx     Pancreatic Cancer Neg Hx     Rectal cancer Neg Hx     Stomach cancer Neg Hx      Social History     Socioeconomic History    Marital status: Married   Tobacco Use    Smoking status: Never    Smokeless tobacco: Never   Vaping Use    Vaping status: Never Used   Substance and Sexual Activity    Alcohol use: No    Drug use: No    Sexual activity: Yes     Partners: Female     Vitals & Physical Exam     Current Vitals Vitals Range (24 Hours)   BP 141/77   Pulse 65   Temp 36.4 C (97.5 F)   Wt 69.5 kg (153 lb 3.2 oz)   SpO2 96%   BMI 23.99 kg/m  Temp:  [36.4 C (97.5 F)] 36.4 C (97.5 F)  Heart Rate:  [65] 65  BP: (141)/(77) 141/77     Physical Exam:  General: Patient sitting comfortably in chair. Calm, cooperative, in no acute distress.  Pulmonary: NWOB.  Neurological: Alert and oriented. Speech is fluent, comprehension is intact in native  language.  Musculoskeletal: No shoulder joint tenderness at rest. Positive Jobe test, no limitation with lateral rotation against resistance with elbow flexed, Hornblower test positive, bear hug test positive.     Assessment & Plan   Tristan Mcbride is a 77 y.o. male with history of HTN, HLD, T2DM, BPH, GERD & age-related hearing loss who presents today with his wife for follow-up and discussion of acute right-sided shoulder pain. Examination does suggest rotator cuff pathology, though will obtain plain film to assess for osteoarthritis prior to Physical Therapy prescription. Pain control with Tylenol alone given CKD3A. Will obtain basic hypercalcemia given mild persistent hypercalcemia. Diabetes care up to date.    #Type 2 Diabetes Mellitus  -Last A1c 6.1% in June 2023, repeat A1c today.  -Urine microalbumin/creatinine completed February 2024.  -Diabetic foot exam completed February 2024.  -Follows with Ophthalmology, next visit scheduled for October 2024.  -Follows with Podiatry, number provided to re-schedule.    #Hypertension  -Blood pressure slightly above goal.  -Continue with lisinopril 20 mg daily.    #Benign Prostatic Hyperplasia   -Continue with tamsulosin 0.4 mg nightly & Cialis 5 mg daily.  -Follows with Urology, plan for repeat prostatic MRI.    #Mild Hypercalcemia  -Repeat BMP today, check vitamin D & PTH.    #Chronic Kidney Disease, Stage 3A  -Follow BMP.    Health Maintenance   Abdominal Aortic Aneurysm (one-time screening in men 65-75 with history of tobacco use) - N/A  Breast Cancer (biennial screening in women 50-74) - N/A  Cervical Cancer (Pap smear every 3 years in women 21-65) - N/A  Colorectal Cancer (colonoscopy every 10 years in adults 45-75) - Ongoing attempts at scheduling  Diabetes (annual A1c in overweight or adults >45) - Last A1c 6.1% June 2023, repeat today  Immunizations - Zoster, Tdap  Lipid Disorder (annual screening in adults with increased CHD risk) - Will recheck today  Lung Cancer  (annual screening in adults 50-80 with 20 pack-year history and active smoking within 15  years) - N/A  Osteoporosis (one-time screening in women >64) - N/A    Orders Placed     Orders Placed This Encounter    *Shoulder RIGHT standard AP, Grashey, and Lateral views    Calcium    Vitamin D    PTH, intact    Hemoglobin A1c     [name], MD   Signed 09/25/2022 at 9:31 AM

## 2022-09-25 ENCOUNTER — Other Ambulatory Visit
Admission: RE | Admit: 2022-09-25 | Discharge: 2022-09-25 | Disposition: A | Discharge status: Home / Self Care | Payer: 59 | Source: Ambulatory Visit | Attending: Internal Medicine | Admitting: Internal Medicine

## 2022-09-25 ENCOUNTER — Other Ambulatory Visit: Payer: Self-pay

## 2022-09-25 ENCOUNTER — Telehealth: Payer: Self-pay | Admitting: Internal Medicine

## 2022-09-25 ENCOUNTER — Ambulatory Visit: Payer: 59 | Attending: Internal Medicine | Admitting: Internal Medicine

## 2022-09-25 ENCOUNTER — Encounter: Payer: Self-pay | Admitting: Internal Medicine

## 2022-09-25 VITALS — BP 141/77 | HR 65 | Temp 97.5°F | Wt 153.2 lb

## 2022-09-25 DIAGNOSIS — M25511 Pain in right shoulder: Secondary | ICD-10-CM

## 2022-09-25 DIAGNOSIS — E119 Type 2 diabetes mellitus without complications: Secondary | ICD-10-CM

## 2022-09-25 DIAGNOSIS — E210 Primary hyperparathyroidism: Secondary | ICD-10-CM

## 2022-09-25 LAB — CALCIUM: Calcium: 11 mg/dL — ABNORMAL HIGH (ref 8.6–10.2)

## 2022-09-25 LAB — HEMOGLOBIN A1C: Hemoglobin A1C: 6.2 % — ABNORMAL HIGH

## 2022-09-25 LAB — PTH, INTACT: Intact PTH: 35.8 pg/mL (ref 15.0–65.0)

## 2022-09-25 LAB — VITAMIN D: 25-OH Vit Total: 25 ng/mL — ABNORMAL LOW (ref 30–60)

## 2022-09-25 NOTE — Telephone Encounter (Signed)
Telephone Note:  Spoke with patient's wife with assistance of [employer] interpreter to discuss results of recent lab work, which was suggestive of primary hyperparathyroidism. He is asymptomatic from this. We discussed the etiology of his hyperparathyroidism and management options for asymptomatic primary hyperparathyroidism. We discussed the indications for operative intervention, which include serum calcium concentration of >1 mg/dL (9.14 mmol/L) above the upper limit of normal, low bone density, previous asymptomatic vertebral fracture, estimated GFR <60, 24-hour urinary calcium >300 mg/day, prior nephrolithiasis, and age <50. Discussed indication for further testing and follow-up to determine best next steps (though he does technically meet criteria based on kidney function). I will order DEXA scan, and 24-hour urinary calcium to be ordered by CCP. Will then consider referral to Endocrine Surgery in future pending patient's goals.    [name], MD  Signed 09/25/22 at 11:12 AM

## 2022-09-25 NOTE — Patient Instructions (Addendum)
Llame al [phone] para programar una cita con Podlogo.

## 2022-09-25 NOTE — Result Encounter Note (Signed)
PTH inappropriately normal, suggesting primary hyperparathyroidism. Can discuss indication for operative intervention at next visit.

## 2022-09-27 NOTE — Result Encounter Note (Signed)
Vitamin D level technically sufficient.

## 2022-10-07 ENCOUNTER — Encounter: Payer: Self-pay | Admitting: Internal Medicine

## 2022-10-07 NOTE — Progress Notes (Signed)
High calcium 11  PTH lower end normal.  Vit D low    Concern for PHPT vs PTHrp hypercalcemia    Patient needs sooner follow up in person to discuss high calcium level.  Should have PTHrp checked given PTH lower end normal, and 24 hour urine calcium to be ordered as well.   Also need to discuss starting low dose D3 [phone] international unit daily.   Pending these results likely will need endocrinology referral if PTHrp is normal.   Discussed with Dr. Peter Minium PCP - agree needs sooner in person appointment to discuss.     Message sent to [facility] to please schedule within 1 -2 weeks with Dr. Peter Minium team resident while Peter Minium precepts, if not possible then with team NP while Dr. Peter Minium precepts.    Theodoro Kos, MD  10/07/22

## 2022-11-04 ENCOUNTER — Telehealth: Payer: Self-pay | Admitting: Internal Medicine

## 2022-11-04 NOTE — Telephone Encounter (Signed)
Writer called patient with [employer] to schedule patient for a sooner visit. Patient's spouse answered but not in [place]. Writer asked spouse to have patient call the office back.    If patient calls back, please schedule a sooner appt with a [place] team provider.

## 2022-11-05 NOTE — Telephone Encounter (Signed)
Copied from CRM #[id number]. Topic: Return Call - Schedule Appointment  >> Nov 05, 2022 12:34 PM Sue Lush wrote:  Alvi returning call per request.  [place] is [place] in [place]  Requested moving his 8/9 visit to 8/15 and has been rescheduled to 8/15, 1:30 pm with Dr Jonny Ruiz

## 2022-11-05 NOTE — Telephone Encounter (Signed)
Message noted 11/05/22

## 2022-11-09 ENCOUNTER — Other Ambulatory Visit: Payer: Self-pay | Admitting: Internal Medicine

## 2022-11-09 DIAGNOSIS — N400 Enlarged prostate without lower urinary tract symptoms: Secondary | ICD-10-CM

## 2022-11-09 MED ORDER — TAMSULOSIN HCL 0.4 MG PO CAPS *I*
0.4000 mg | ORAL_CAPSULE | Freq: Every evening | ORAL | 0 refills | Status: DC
Start: 2022-11-09 — End: 2023-02-01

## 2022-11-09 NOTE — Telephone Encounter (Signed)
Medication request routed to Dr. Milus Mallick for processing 11/09/2022 @10 :50 AM

## 2022-11-20 ENCOUNTER — Ambulatory Visit: Payer: 59

## 2022-11-26 ENCOUNTER — Ambulatory Visit: Payer: 59

## 2022-11-26 NOTE — Progress Notes (Signed)
Clinic Note    Reason for visit: Tristan Mcbride is a 77 y.o. male here for follow-up.     HPI:     Tristan Mcbride is a 77 y.o. male with PMHx below but most significant for hypertension, type 2 diabetes mellitus, who presents to clinic today to discuss the following topics:    Accompanied by his wife and great grandson. Patient was examined with assistance of virtual [employer] Spanish interpreter.    The patient was scheduled for follow up today for hypercalcemia to 11 noted on labs from June 2024 with inappropriately normal PTH and slightly decreased vitamin D. The patient says that he is feeling well overall and has no particular acute concerns.    Denies abdominal pain, bone pain, difficulty sleeping, memory problems, difficulty sleeping, worsening fatigue, decreased appetite, muscle weakness.    He does report frequent urination and has to wake up during the night to urinate, but says this has been stable over time roughly since the time of his diabetes diagnosis and has not increased this year.    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      Objective   Past Medical History: has Hypoglycemia; Hyperlipidemia; Essential Hypertension; Esophageal Reflux; Intramuscular Adipose Tissue Fibrolipoma; Rotator Cuff Tendonitis; BPH without obstruction/lower urinary tract symptoms; Sexual Dysfunction, NOS; Elevated PSA; Hyperglycemia; and DM (diabetes mellitus) on their problem list.      Medications:   Current Outpatient Medications   Medication Sig    tamsulosin (FLOMAX) 0.4 mg capsule Take 1 capsule (0.4 mg total) by mouth every evening.    lisinopril (PRINIVIL,ZESTRIL) 20 mg tablet Take 1 tablet (20 mg total) by mouth daily.    tadalafil (CIALIS) 5 MG tablet Take 1 tablet (5 mg total) by mouth daily.    atorvastatin (LIPITOR) 20 mg tablet Take 1 tablet (20 mg total) by mouth daily (with dinner)    metFORMIN (GLUCOPHAGE) 500 mg tablet Take 1 tablet (500 mg total) by mouth 2 times daily (with meals)    ketoconazole (NIZORAL) 2 %  cream Apply topically daily    senna (SENOKOT) 8.6 mg tablet Take 2 tablets by mouth daily    blood pressure monitor, automatic with arm cuff Use as directed to monitor blood pressure    psyllium (METAMUCIL SMOOTH TEXTURE) 28.3 % POWD powder 1 tablespoon in 8 oz of water daily by mouth.     No current facility-administered medications for this visit.      OBJECTIVE:      Vitals:    11/27/22 0811   BP: 129/64   BP Location: Left arm   Patient Position: Sitting   Cuff Size: adult   Pulse: 76   Temp: 36.5 C (97.7 F)   TempSrc: Temporal   SpO2: 93%   Weight: 67.2 kg (148 lb 1.6 oz)       PHYSICAL EXAM:   General: Resting comfortably. No acute distress, non-toxic in appearance.  HEENT: Atraumatic, normocephalic. Moist mucus membranes. No nodules noted on exam of thyroid and parathyroid.  Cardiac: Regular rate and rhythm. No murmurs.  Respiratory: Clear to auscultation bilaterally. No wheezes or crackles. Non-labored breathing without accessory muscle use on ambient air.  Abdominal: Soft, non-tender, non-distended abdomen without guarding.  MSK/Extremities: No peripheral edema. Lateral rotation of R shoulder limited by pain.  Skin: Warm and dry with no rashes or wounds to exposed skin.  Neuro/Psych: Alert and answers questions appropriately. Moving all extremities spontaneously. Observed dexterity without tremor. Appropriate affect.  ASSESSMENT/PLAN:     Tristan Mcbride is a 77 y.o. male with PMHx outlined above here for follow-up.    Hypercalcemia is more likely to be to be due to primary hyperparathyroidism. Will also evaluate other causes such as multiple myeloma, paraneoplastic hypercalcemia, granulomatous disease, familial hypocalciuric hypercalcemia. Patient remains asymptomatic so will hold off on parathyroid [place] at this time until further workup has been completed.    #Hypercalcemia (Ca of 11, vitamin D slightly low, PTH inappropriately normal)  - Check 1, 25 vitamin D level  - 24 hour urinary calcium for familial  hypocalciuric hypercalcemia  - check PTHrp (if normal, consider endocrinology referral)  - CMP for paraproteinemia  - DEXA scan ordered at last visit, encouraged patient to schedule  - SPEP, Immunofixation    #Right shoulder pain possibly secondary to Rotator cuff tendinopathy?  - Shoulder xray ordered at last visit to evaluate for possible osteoarthritis prior to PT referral  - Encouraged patient to complete.    #Type 2 Diabetes mellitus  - Patient previously followed with podiatry, encourage to schedule follow up appointment  -Urine microalbumin/creatinine completed February 2024.  -Diabetic foot exam completed February 2024.    #Benign Prostatic Hyperplasia   -Continue with tamsulosin 0.4 mg nightly & Cialis 5 mg daily PRN.  -Follows with Urology, plan for repeat prostatic MRI.    #Chronic Kidney Disease, Stage 3A  -Follow CMP    #Health Maintenance  - follow up at future appointments, colonoscopy, Shingrix, Pneumovax due    Lung CA (55-80 with 30 pyh and quit within last 15- NA  Shingrix (50+; 2 doses separated by 2-6 months)  AAA screening (one-time screening men 65 to 75 years who have ever smoked)-NA  CRC screening (45 to 75 or 10 yrs before first degree relative Dx)    Follow-Up: 2 weeks in [facility]  Case discussed with: Dr. Al Corpus    --  Reinaldo Berber, MD  Internal Medicine, PGY-1  ______________________

## 2022-11-27 ENCOUNTER — Other Ambulatory Visit: Payer: 59

## 2022-11-27 ENCOUNTER — Other Ambulatory Visit: Payer: Self-pay

## 2022-11-27 ENCOUNTER — Other Ambulatory Visit
Admission: RE | Admit: 2022-11-27 | Discharge: 2022-11-27 | Disposition: A | Discharge status: Home / Self Care | Payer: 59 | Source: Ambulatory Visit | Attending: Internal Medicine | Admitting: Internal Medicine

## 2022-11-27 ENCOUNTER — Ambulatory Visit: Payer: 59 | Attending: Internal Medicine

## 2022-11-27 DIAGNOSIS — I10 Essential (primary) hypertension: Secondary | ICD-10-CM

## 2022-11-27 DIAGNOSIS — M25511 Pain in right shoulder: Secondary | ICD-10-CM

## 2022-11-27 DIAGNOSIS — E119 Type 2 diabetes mellitus without complications: Secondary | ICD-10-CM

## 2022-11-27 LAB — COMPREHENSIVE METABOLIC PANEL
ALT: 15 U/L (ref 0–50)
AST: 16 U/L (ref 0–50)
Albumin: 4.4 g/dL (ref 3.5–5.2)
Alk Phos: 86 U/L (ref 40–130)
Anion Gap: 10 (ref 7–16)
Bilirubin,Total: 0.3 mg/dL (ref 0.0–1.2)
CO2: 26 mmol/L (ref 20–28)
Calcium: 10.9 mg/dL — ABNORMAL HIGH (ref 8.6–10.2)
Chloride: 104 mmol/L (ref 96–108)
Creatinine: 1.28 mg/dL — ABNORMAL HIGH (ref 0.67–1.17)
Glucose: 81 mg/dL (ref 60–99)
Lab: 24 mg/dL — ABNORMAL HIGH (ref 6–20)
Potassium: 4.7 mmol/L (ref 3.3–5.1)
Sodium: 140 mmol/L (ref 133–145)
Total Protein: 7.5 g/dL (ref 6.3–7.7)
eGFR BY CREAT: 58 * — AB

## 2022-11-27 LAB — CALCIUM, URINE: Calcium,Ur: 12.2 mg/dL

## 2022-11-27 NOTE — Patient Instructions (Addendum)
Call Radiology to schedule DEXA (bone density) scan. [phone]    You can go downstairs today to get your shoulder X ray.    Please get blood work and urine studies today at your earliest convenience.    Please ask the laboratory for the container for 24 hour urine collection and you can bring it to any [facility] laboratory location when completed.    Please follow up in our clinic in 2 weeks.

## 2022-11-30 LAB — KAPPA/LAMBDA FREE LIGHT CHAINS (EFF. 10/2020)
KAPPA FREE LIGHT CHAINS (EFF. 10-2020): 4.85 mg/dL — ABNORMAL HIGH (ref 0.33–1.94)
KAPPA/LAMBDA RATIO (EFF. 10-2020): 1.41 (ref 0.26–1.65)
LAMBDA FREE LIGHT CHAINS (EFF. 10-2020): 3.43 mg/dL — ABNORMAL HIGH (ref 0.57–2.63)

## 2022-12-01 LAB — VITAMIN D 1,25 DIHYDROXY: Vit D, 1,25-Dihydroxy: 40.6 pg/mL (ref 19.9–79.3)

## 2022-12-02 LAB — PROTEIN ELECTROPHORESIS, SERUM
A/G Ratio: 1.2 (ref 0.9–1.8)
Albumin: 4.1 g/dL (ref 3.5–5.1)
Alpha 1: 0.2 g/dL (ref 0.2–0.4)
Alpha 2: 0.9 g/dL (ref 0.4–0.9)
Beta: 0.9 g/dL (ref 0.5–1.0)
Gamma: 1.3 g/dL (ref 0.7–1.4)
Interp,PE: NORMAL

## 2022-12-02 LAB — PE ELECT,REVIEW

## 2022-12-02 LAB — IMMUNOFIXATION ELECTROPHORESIS: Immunofixation,Serum: NORMAL

## 2022-12-02 LAB — IMMUNOFIX,SERUM REVIEW

## 2022-12-03 LAB — PTH-RELATED PEPTIDE: PTH-Related Protein: 3 pmol/L — ABNORMAL HIGH (ref 0.0–2.3)

## 2022-12-04 ENCOUNTER — Other Ambulatory Visit: Payer: Self-pay | Admitting: Internal Medicine

## 2022-12-04 DIAGNOSIS — I10 Essential (primary) hypertension: Secondary | ICD-10-CM

## 2022-12-04 NOTE — Telephone Encounter (Signed)
Medication request routed to Dr. Jonny Ruiz for processing 12/04/2022 @8 :25 AM

## 2022-12-07 MED ORDER — LISINOPRIL 20 MG PO TABS *I*
20.0000 mg | ORAL_TABLET | Freq: Every day | ORAL | 0 refills | Status: DC
Start: 2022-12-07 — End: 2023-03-10

## 2022-12-09 ENCOUNTER — Ambulatory Visit: Payer: 59 | Admitting: Internal Medicine

## 2022-12-09 ENCOUNTER — Other Ambulatory Visit: Payer: Self-pay

## 2022-12-09 NOTE — Progress Notes (Deleted)
[facility]       Reason for visit: There were no encounter diagnoses.  No diagnosis found.    HPI:  Tristan Mcbride is 77 y.o. year old male with history of  hypertension, type 2 diabetes mellitus, who is here today for a follow up visit.   - Last seen at [facility] on 11/27/22.  Hypercalcemia (Ca of 11, vitamin D slightly low, PTH inappropriately normal)  - Check 1, 25 vitamin D level  - 24 hour urinary calcium for familial hypocalciuric hypercalcemia  - check PTHrp (if normal, consider endocrinology referral)  - CMP for paraproteinemia  - DEXA scan ordered at last visit, encouraged patient to schedule  - SPEP, Immunofixation     Subjective     ***          Past Medical History:   Diagnosis Date    BPH (benign prostatic hypertrophy)     DM (diabetes mellitus) 10/13/2017    Esophageal reflux     Fibrolipoma     intramuscular adipose tissue    Hyperlipidemia     Hypertension     Hypoglycemia     Kidney cyst     Rotator cuff tendinitis     Right    Sexual dysfunction     absent ejaculation    Type 2 diabetes mellitus     Weight loss        Medications:     Current Outpatient Medications   Medication Sig    lisinopril (PRINIVIL,ZESTRIL) 20 mg tablet Take 1 tablet (20 mg total) by mouth daily.    tamsulosin (FLOMAX) 0.4 mg capsule Take 1 capsule (0.4 mg total) by mouth every evening.    tadalafil (CIALIS) 5 MG tablet Take 1 tablet (5 mg total) by mouth daily.    atorvastatin (LIPITOR) 20 mg tablet Take 1 tablet (20 mg total) by mouth daily (with dinner)    metFORMIN (GLUCOPHAGE) 500 mg tablet Take 1 tablet (500 mg total) by mouth 2 times daily (with meals)    ketoconazole (NIZORAL) 2 % cream Apply topically daily    senna (SENOKOT) 8.6 mg tablet Take 2 tablets by mouth daily    blood pressure monitor, automatic with arm cuff Use as directed to monitor blood pressure    psyllium (METAMUCIL SMOOTH TEXTURE) 28.3 % POWD powder 1 tablespoon in 8 oz of water daily by mouth.     No current facility-administered  medications for this visit.       Medication list reconciled this visit    Allergies:     Allergies   Allergen Reactions    Tetanus Toxoids      On Amb Int Med note from 08/20/09    No Known Latex Allergy        Social history      Social History     Tobacco Use    Smoking status: Never    Smokeless tobacco: Never   Substance Use Topics    Alcohol use: No       Review of Systems     ROS  12 point review of system was negative except those mentioned in HPI     Objective     Physical Exam:     Physical Exam       There were no vitals filed for this visit.  Wt Readings from Last 3 Encounters:   11/27/22 67.2 kg (148 lb 1.6 oz)   09/25/22 69.5 kg (153 lb 3.2 oz)  06/05/22 71 kg (156 lb 9.6 oz)     BP Readings from Last 3 Encounters:   11/27/22 129/64   09/25/22 141/77   06/05/22 124/59              ASSESSMENT/PLAN:     There are no diagnoses linked to this encounter.        Follow up: No follow-ups on file.      There are no Patient Instructions on file for this visit.         Signed by [name], NP [facility] 8/28/20247:02 AM

## 2022-12-10 ENCOUNTER — Encounter: Payer: Self-pay | Admitting: Internal Medicine

## 2022-12-17 NOTE — Telephone Encounter (Signed)
Added to wait list.

## 2022-12-28 ENCOUNTER — Telehealth: Payer: Self-pay | Admitting: Student in an Organized Health Care Education/Training Program

## 2022-12-28 ENCOUNTER — Other Ambulatory Visit
Admission: RE | Admit: 2022-12-28 | Discharge: 2022-12-28 | Disposition: A | Discharge status: Home / Self Care | Payer: 59 | Source: Ambulatory Visit

## 2022-12-28 DIAGNOSIS — R972 Elevated prostate specific antigen [PSA]: Secondary | ICD-10-CM

## 2022-12-28 NOTE — Telephone Encounter (Signed)
Copied from CRM #[id number]. Topic: Access to Care - Speak to Provider/Office Staff  >> Dec 28, 2022 10:42 AM Deeann Cree wrote:  The lab called for [facility]  He showed up with a 24 hour urine and they had no order for him  Writer did not see an order for [facility] and did not see an order in the labs tab  Office visit notes did not mention a 24 hour urine test    Please call to advise at [phone]

## 2022-12-31 ENCOUNTER — Telehealth: Payer: Self-pay | Admitting: Internal Medicine

## 2022-12-31 NOTE — Telephone Encounter (Signed)
Tristan Berber, MD  [name]  [name],    Can you please schedule an appointment for this patient to be seen in the next week or two if possible? I saw he missed his previously scheduled follow up appointment and I feel he requires close follow up based on his lab results.    Thank you!  Ayesha Rumpf

## 2022-12-31 NOTE — Telephone Encounter (Signed)
Looks like this was ordered by PCP office per note:  24 hour urinary calcium for familial hypocalciuric hypercalcemia     Please ask the laboratory for the container for 24 hour urine collection and you can bring it to any [facility] laboratory location when completed.

## 2023-01-03 ENCOUNTER — Other Ambulatory Visit: Payer: Self-pay

## 2023-01-05 NOTE — Telephone Encounter (Addendum)
Called patient using [employer] to schedule fuv. Another person answered so message was left to have the patient call the office back.    If patient calls back, please schedule fuv with a [facility] team provider

## 2023-01-07 NOTE — Telephone Encounter (Signed)
Used Designer, jewellery for [place], Leretha Pol # [phone]) Information relayed to patient via interpreter and it was relayed that patient understood information given.

## 2023-01-07 NOTE — Telephone Encounter (Signed)
I was unable to modify the order but I have placed another order for 24 hour urine calcium for lab collect.    Thank you!  Reinaldo Berber     ________________________________________________

## 2023-01-11 ENCOUNTER — Other Ambulatory Visit: Payer: Self-pay

## 2023-01-11 ENCOUNTER — Ambulatory Visit
Admission: RE | Admit: 2023-01-11 | Discharge: 2023-01-11 | Disposition: A | Discharge status: Home / Self Care | Payer: 59 | Source: Ambulatory Visit | Attending: Student in an Organized Health Care Education/Training Program | Admitting: Student in an Organized Health Care Education/Training Program

## 2023-01-11 DIAGNOSIS — R972 Elevated prostate specific antigen [PSA]: Secondary | ICD-10-CM | POA: Insufficient documentation

## 2023-01-11 MED ORDER — GADOPICLENOL 0.5 MMOL/ML (VUEWAY) IV SOLN *I*
10.0000 mL | Freq: Once | INTRAVENOUS | Status: AC
Start: 2023-01-11 — End: 2023-01-11
  Administered 2023-01-11: 10 mL via INTRAVENOUS

## 2023-01-18 ENCOUNTER — Other Ambulatory Visit
Admission: RE | Admit: 2023-01-18 | Discharge: 2023-01-18 | Disposition: A | Discharge status: Home / Self Care | Payer: 59 | Source: Ambulatory Visit | Attending: Internal Medicine | Admitting: Internal Medicine

## 2023-01-18 LAB — CALCIUM, URINE
Calcium,Ur: 11 mg/dL
Calcium/24H,UR: 132 mg/(24.h) (ref 100–240)
Collection Period: 24 h
Time End: 8
Time Start: 8
Total Volume,UR: 1200 mL

## 2023-01-18 NOTE — Progress Notes (Signed)
01/19/2023    RE: Rohn Welden  DOB: Oct 23, 1945  MRN: [id number]    Dear Dr. Peter Minium,     This is a brief follow up on your patient, Mr. Aguayo who, as you are aware, with a longstanding history of elevated PSA and 2 negative prior biopsies.  We updated a prostate MRI which did not show any suspicious lesions and a 160 cc gland and his PSA remains stable at 9.46.         PHYSICAL EXAM:  Vitals:  height is 1.702 m (5\' 7" ) and weight is 70.8 kg (156 lb).   Pain Score:   0 - No pain      Well appearing adult male. Unlabored respirations. Abdomen soft, non-tender, without any palpable masses. No lower extremity edema    IMAGING:  Prostate MRI with and without contrast  01/11/23  Prostate Volume: 160 cc   Again noted 2.5 cm lesion in the left mid anterior transition zone, left mid anterior stroma, left apical anterior transition zone and left apical anterior stroma corresponding to: PIRADS assessment category 2, Low (clinically significant cancer is   unlikely to be present).     IMPRESSION:  In summary, Mr. Hinkle is a 77 y.o. male  with a history of LUTS and elevated PSA status post two prior negative biopsies.  Given the lack of suspicious lesions on his MRI, his prior negative biopsies, and his stable PSA I did not think that a repeat biopsy was necessary at this time.      PLAN:  - Follow up in one year with PSA    It is a pleasure to participate in his care. Should you have any questions, feel free to contact me at any time.     Respectfully yours,      Talmadge Coventry. Guilford Shannahan MD, PhD  Assistant Professor of Urology  Department of Urology  [facility]

## 2023-01-19 ENCOUNTER — Other Ambulatory Visit: Payer: Self-pay

## 2023-01-19 ENCOUNTER — Encounter: Payer: Self-pay | Admitting: Student in an Organized Health Care Education/Training Program

## 2023-01-19 ENCOUNTER — Ambulatory Visit: Payer: 59 | Attending: Urology | Admitting: Student in an Organized Health Care Education/Training Program

## 2023-01-19 VITALS — Ht 67.0 in | Wt 156.0 lb

## 2023-01-19 DIAGNOSIS — R972 Elevated prostate specific antigen [PSA]: Secondary | ICD-10-CM

## 2023-01-21 NOTE — Progress Notes (Signed)
[facility] Office Note:    Subjective     Tristan Mcbride is a 77 y.o. male with PMHx below but most significant for hypertension, type 2 diabetes mellitus, CKD, who presents to clinic today to discuss the following topics:     Tristan Mcbride presents to clinic today accompanied by his wife for follow-up of hypercalcemia.  He is examined with the assistance of Spanish phone interpreter.    He denies joint pain, depression, muscle weakness, concentration problems, appetite, nausea/vomiting, increased thirst, or confusion. Says that he is intermittently constipated, but says this chronic issue for him and is unchanged in the last several years.  He reports sometimes having a bowel movement every 2 or 3 days.  Denies any respiratory symptoms and feels he is breathing well.    He is completed additional blood work and urine studies for his hypercalcemia since his last visit.  He has not yet scheduled his DEXA scan ordered several months ago.    Since his last visit, he has had no changes to his health.  Recently followed up with urology for elevated PSA and had a negative MR of his prostate.  Urology does not recommend biopsy and is recommending follow-up in 1 year.      Medications & Allergies     Current Outpatient Medications   Medication Sig    lisinopril (PRINIVIL,ZESTRIL) 20 mg tablet Take 1 tablet (20 mg total) by mouth daily.    tamsulosin (FLOMAX) 0.4 mg capsule Take 1 capsule (0.4 mg total) by mouth every evening.    tadalafil (CIALIS) 5 MG tablet Take 1 tablet (5 mg total) by mouth daily.    atorvastatin (LIPITOR) 20 mg tablet Take 1 tablet (20 mg total) by mouth daily (with dinner)    metFORMIN (GLUCOPHAGE) 500 mg tablet Take 1 tablet (500 mg total) by mouth 2 times daily (with meals)    ketoconazole (NIZORAL) 2 % cream Apply topically daily    senna (SENOKOT) 8.6 mg tablet Take 2 tablets by mouth daily    blood pressure monitor, automatic with arm cuff Use as directed to monitor blood pressure    psyllium  (METAMUCIL SMOOTH TEXTURE) 28.3 % POWD powder 1 tablespoon in 8 oz of water daily by mouth.        He is allergic to tetanus toxoids and no known latex allergy.      Past Medical & Surgical History     Past Medical History:   Diagnosis Date    BPH (benign prostatic hypertrophy)     DM (diabetes mellitus) 10/13/2017    Esophageal reflux     Fibrolipoma     intramuscular adipose tissue    Hyperlipidemia     Hypertension     Hypoglycemia     Kidney cyst     Rotator cuff tendinitis     Right    Sexual dysfunction     absent ejaculation    Type 2 diabetes mellitus     Weight loss      Past Surgical History:   Procedure Laterality Date    KNEE SURGERY      left    LEG TENDON SURGERY      Right         Social & Family History     Family History   Problem Relation Age of Onset    Heart Disease Mother     Diabetes Mother     Diabetes Father     Stroke Father  Cerebral Palsy Father     Breast cancer Maternal Aunt     Colon cancer Neg Hx     Esophageal cancer Neg Hx     Liver cancer Neg Hx     Pancreatic Cancer Neg Hx     Rectal cancer Neg Hx     Stomach cancer Neg Hx      Social History     Socioeconomic History    Marital status: Married   Tobacco Use    Smoking status: Never    Smokeless tobacco: Never   Vaping Use    Vaping status: Never Used   Substance and Sexual Activity    Alcohol use: No    Drug use: No    Sexual activity: Yes     Partners: Female         Vitals & Physical Exam     Current Vitals Vitals Range (24 Hours)   BP (!) 169/93 (BP Location: Right arm, Patient Position: Sitting, Cuff Size: large adult)   Pulse 65   Temp 36.2 C (97.2 F) (Temporal)   Ht 1.702 m (5\' 7" )   Wt 72 kg (158 lb 12.8 oz)   SpO2 97%   BMI 24.87 kg/m  Temp:  [36.2 C (97.2 F)] 36.2 C (97.2 F)  Heart Rate:  [65] 65  BP: (169)/(93) 169/93     Physical Exam:  General: Patient sitting comfortably in chair. Calm, cooperative, in no acute distress.  HEENT: NC/AT, anicteric, mucous membranes moist without exudate or lesions. Left  ear canal is not erythematous, not edematous, no pus. Tympanic membrane is not bulging. No effusion  Neck: No cervical or supraclavicular lymphadenopathy, no JVD.  Cardiovascular: RRR, no m/r/g, normal S1 and S2.  Pulmonary: NWOB, CTAB.  Abdominal: Soft, NTND, +ve BS.  Neuro: Alert and oriented x3. Speech is fluent, comprehension is intact.   Extremities: Warm, dry, no edema.     Assessment & Plan     Tristan Mcbride is a 77 y.o. male with PMHx outlined above here for follow-up.     This patient's hypercalcemia may be multifactorial based on pulmonary lab findings.  May have primary hyperparathyroidism given his inappropriately normal PTH.  However, PTH related protein of 3 is elevated and coexisting paraneoplastic process cannot be excluded. Other causes such as multiple myeloma, familial hypocalciuric hypercalcemia, granulomatous disease are less likely based on lab findings.    #Hypercalcemia (Ca of ~11, PTH inappropriately normal, PTH-rp elevated to 3)  Paraneoplastic hypercalcemia  PTHrp is elevated at 3  -CT Chest w/o contrast to evaluate for lung malignancy    Primary hyperparathyroidism  -Ordered thyroid/parathyroid ultrasound  -Encouraged patient to schedule previously ordered DEXA scan  -Endocrinology referral for further evaluation    #Right shoulder pain possibly secondary to Rotator cuff tendinopathy?  - Shoulder xray ordered at last visit to evaluate for possible osteoarthritis prior to PT referral  - Encouraged patient to complete.     #Type 2 Diabetes mellitus  - Patient previously followed with podiatry, encourage to schedule follow up appointment  -Urine microalbumin/creatinine completed February 2024.  -Diabetic foot exam completed February 2024.     #Benign Prostatic Hyperplasia   -Continue with tamsulosin 0.4 mg nightly & Cialis 5 mg daily PRN.  -Follows with Urology, plan for repeat prostatic MRI.     #Chronic Kidney Disease, Stage 3A  -Follow CMP    Health Maintenance   Abdominal Aortic Aneurysm  (one-time screening in men 65-75 with history of tobacco use) -  NA  Colorectal Cancer (colonoscopy every 10 years in adults 45-75) - follow up at next visit  Diabetes (annual A1c in overweight or adults >45) - 6.1 in June 2024  Immunizations - Follow up flu vaccine at next visit  Lipid Disorder (annual screening in adults with increased CHD risk) - Feb 2024  Lung Cancer (annual screening in adults 50-80 with 20 pack-year history and active smoking within 15 years) - NA    Orders Placed     Orders Placed This Encounter    CT chest without contrast    US thyroid parathyroid    AMB REFERRAL TO [facility]OCRINOLOGY - [facility]     Reinaldo Berber, MD   Internal Medicine PGY-1  Signed 01/22/2023 at 11:26 AM

## 2023-01-22 ENCOUNTER — Ambulatory Visit: Payer: 59 | Attending: Internal Medicine

## 2023-01-22 ENCOUNTER — Other Ambulatory Visit: Payer: Self-pay

## 2023-01-22 ENCOUNTER — Telehealth: Payer: Self-pay

## 2023-01-22 NOTE — Telephone Encounter (Signed)
Hypercalcemia    Please review and advise

## 2023-01-22 NOTE — Patient Instructions (Addendum)
Call to schedule the DEXA scan to check your bones: [phone]    Please schedule the ultrasound of your neck and scan of your chest.    Please follow up with endocrinology (hormone doctor).

## 2023-01-25 NOTE — Telephone Encounter (Signed)
Attempt 2 of 2: No answer, message left. The patient can call [phone] if he would like to schedule.

## 2023-01-27 ENCOUNTER — Other Ambulatory Visit: Payer: Self-pay

## 2023-01-27 ENCOUNTER — Ambulatory Visit: Payer: 59 | Attending: Optometry | Admitting: Optometry

## 2023-01-27 ENCOUNTER — Encounter: Payer: Self-pay | Admitting: Optometry

## 2023-01-27 DIAGNOSIS — H43813 Vitreous degeneration, bilateral: Secondary | ICD-10-CM | POA: Insufficient documentation

## 2023-01-27 DIAGNOSIS — H04123 Dry eye syndrome of bilateral lacrimal glands: Secondary | ICD-10-CM | POA: Insufficient documentation

## 2023-01-27 DIAGNOSIS — H40003 Preglaucoma, unspecified, bilateral: Secondary | ICD-10-CM | POA: Insufficient documentation

## 2023-01-27 DIAGNOSIS — H2513 Age-related nuclear cataract, bilateral: Secondary | ICD-10-CM | POA: Insufficient documentation

## 2023-01-27 DIAGNOSIS — E119 Type 2 diabetes mellitus without complications: Secondary | ICD-10-CM | POA: Insufficient documentation

## 2023-01-27 DIAGNOSIS — H524 Presbyopia: Secondary | ICD-10-CM | POA: Insufficient documentation

## 2023-01-27 DIAGNOSIS — H52203 Unspecified astigmatism, bilateral: Secondary | ICD-10-CM | POA: Insufficient documentation

## 2023-01-27 NOTE — Progress Notes (Addendum)
Outpatient Visit      Patient name: Tristan Mcbride  DOB: Nov 27, 1945       Age: 77 y.o.  MR#: [id number]    Encounter Date: 01/27/2023    Subjective:      Chief Complaint   Patient presents with    Diabetes    Decreased Visual Acuity     HPI    Adeyemi Hamad is a 77 y.o. male is here for an annual diabetic eye exam.   Patient has a history of glaucoma susp OU, NS OU, DES. Patient is here   with an in person interpreter.     Patient reports that his vision is becoming worse and states he is unable   to see. States he had something within his OD at the bottom outer corner   because something blew in his eye, and it is still irritated. No new   flashes. States he noticed an increase of floaters. Eyes become red at   times per patient. No swelling, burning, itching and double vision. States   he usually use his glasses to drive.     Ocular meds: Visine PRN OU (for redness)     Patient is diabetic.  Type of Diabetes: II  Duration of diabetes: 4 - 5 years per patient   Most recent BG: did not check   Lab Results       Component                Value               Date                       HA1C                     6.2 (H)             09/25/2022                  Last edited by Jearld Fenton, OD on 01/27/2023  9:08 AM.        has a current medication list which includes the following prescription(s): lisinopril, tamsulosin, tadalafil, atorvastatin, metformin, ketoconazole, senna, blood pressure monitor, automatic with arm cuff, metamucil smooth texture, and [DISCONTINUED] insulin glargine.     is allergic to tetanus toxoids and no known latex allergy.      Past Medical History:   Diagnosis Date    BPH (benign prostatic hypertrophy)     DM (diabetes mellitus) 10/13/2017    Esophageal reflux     Fibrolipoma     intramuscular adipose tissue    Hyperlipidemia     Hypertension     Hypoglycemia     Kidney cyst     Rotator cuff tendinitis     Right    Sexual dysfunction     absent ejaculation    Type 2 diabetes mellitus     Weight loss        Past Surgical History:   Procedure Laterality Date    KNEE SURGERY      left    LEG TENDON SURGERY      Right        Specialty Problems          Ophthalmology Problems    DM (diabetes mellitus)            ROS    Positive for: Endocrine, Eyes  Negative for: Constitutional, Gastrointestinal, Neurological, Skin,  Genitourinary, Musculoskeletal, HENT, Cardiovascular, Respiratory,   Psychiatric, Allergic/Imm, Heme/Lymph  Last edited by Jearld Fenton, OD on 01/27/2023  8:17 AM.         Objective:     Base Eye Exam       Visual Acuity (Snellen - Linear)         Right Left    Dist cc 20/50 +2 20/50 +2    Dist ph cc 20/25 -2 20/30 +2    Near sc J2 OU      Correction: Glasses              Tonometry ([facility] tonometry (ICT), 8:33 AM)         Right Left    Pressure 15 16              Pupils         Shape React APD    Right Round Brisk None    Left Round Brisk None              Visual Fields         Left Right     Full Full              Extraocular Movement         Right Left     Full Full              Neuro/Psych       Oriented x3: Yes    Mood/Affect: Normal              Dilation       Both eyes: 2.5% Phenylephrine, 1.0% Tropicamide, 0.5% Proparacaine @ 8:46 AM                  Additional Tests       Glare Testing (BAT)         High    Right 20/40 -1    Left 20/50 +2                  Slit Lamp and Fundus Exam       External Exam         Right Left    External Normal ocular adnexae, lacrimal gland & drainage, orbits Normal ocular adnexae, lacrimal gland & drainage, orbits              Slit Lamp Exam         Right Left    Lids/Lashes 1+ Meibomian gland dysfunction 1+ Meibomian gland dysfunction    Conjunctiva/Sclera Conjunctivochalasis, trace melanosis, trace mucous at caruncle trace melanosis, temp pinguecula, Conjunctivochalasis    Cornea trace reduced TBUT, trace inf PEE slightly irregular tear film, inferior 1+ PEE, Reduced tear film break up time    Anterior Chamber Clear & deep Clear & deep    Iris Normal shape,  size, morphology Normal shape, size, morphology    Lens 2++ Nuclear sclerosis, few posterior vacoules 2++ Nuclear sclerosis, trace posterior vacoules    Anterior Vitreous Posterior vitreous detachment Posterior vitreous detachment              Fundus Exam         Right Left    Disc Normal size, appearance, nerve fiber layer Normal size, appearance, nerve fiber layer    C/D Ratio 0.7 0.7    Macula Normal, trace ERM Normal, trace inf ERM    Vessels Normal (-) DR OU Normal    Periphery Normal, few peripheral  floaters Normal, peripheral floaters                  Refraction       Wearing Rx         Sphere Cylinder Axis    Right -0.25 -0.50 166    Left [place] -0.50 049      Age: 15yrs    Type: SVL              Manifest Refraction         Sphere Cylinder Axis Dist VA Add [place]    Right -1.25 Sphere  20/30 -1 +2.50 J1+    Left -1.25 -0.25 060 20/40 -2 +2.50 OU              Final Rx         Sphere Cylinder Axis Dist VA Add [place]    Right -1.25 Sphere  20/30 -1 +2.50 J1+    Left -1.25 -0.25 060 20/40 -2 +2.50 OU      Expiration Date: 01/26/2025                  Final Rx         Sphere Cylinder Axis Dist VA Add [place]    Right -1.25 Sphere  20/30 -1 +2.50 J1+    Left -1.25 -0.25 060 20/40 -2 +2.50 OU      Expiration Date: 01/26/2025              Assessment/Plan:      1. Glaucoma suspect of both eyes  OCT, RNFL-OU      2. Type 2 diabetes mellitus without retinopathy        3. Nuclear sclerosis of both eyes        4. Dry eye syndrome of both eyes        5. Astigmatism of both eyes with presbyopia        6. Vitreous degeneration of both eyes  OCT, mac-OU           PLAN:    1. Glaucoma suspect vs. Physiologic cupping OU, ?progression comparing 2008 photos and 2019 photos, although rim tissue appears healthy today and is stable to 2022 dilated exam therefore is likely not progressing at this time.   - IOP:  Normal IOP, 15/15 OD/OS today-stable  I personally ordered and reviewed the OCT today which showed:      OD: normal average  rnfl thickness at 84 um, no signs of damage, green all quadrants, stable to previous scan (slightly worse but more  consistent with cataracts)  OS: normal average rnfl thickness at 80 um, no signs of damage, green all quadrants, stable to previous scan  - last HVF from 2012 appeared normal  -pachymetry is normal  - Monitor annually with dilated examination/OCT RNFL OU/IOP check     2. The patient is without signs of diabetic retinopathy or diabetic macular edema. I personally reviewed the last PCP visit from 01/22/2023 which showed relatively stable diabetes however he has been referred to endocrinology. The patient was counseled to report any visual changes, to regularly check serum glucose levels as directed, to optimize diet, to take prescribed medications, to keep scheduled appointments, and to optimize blood glucose control with HgA1C less than 7 or as primary physician or endocrinologist has specified.  Communication will be forwarded to the responsible physician managing the patient's diabetes.  Follow up has been arranged for 1 year.     3.  Patient educated on visual significant cataracts in both eyes. Recommend cataract surgery at this time. Discussed importance of using artifical tears before cataract consultation to make sure eyes are not dry (recommend four times per day for at least 2 weeks prior to your next appointment). I also recommend warm compresses with a warm washcloth or bruder mask on both eyelids 1x/day for 10 minutes at least 2 weeks prior to your cataract surgery consultation. We also discussed the general cataract surgery process and patient is interested in a distance goal. An appointment was scheduled today for a cataract consultation      4. Discussed findings of mild dry eyes in both eyes, this is consistent for the redness he experiences at times and can also impact blur at times. Educated on the following treatment plan:  -Patient educated to use artificial tears such as refresh,  blink, systane or soothe XP in both eyes as needed up to four times per day. If using >4x/day recommend preservative free artificial tears. Please aim for 4x/day at least 2 weeks prior to your next appointment  -Patient educated on warm compresses with a warm washcloth or bruder mask on both eyelids 1x/day for 10 minutes.   -Can continue saline to wash dust out of eyes as needed after work  -Call or return to clinic as needed if notices changes in vision, loss of vision, new ocular symptoms or worsening of symptoms.     5. Spectacle Rx finalized and given to patient today however since he is interested in cataract surgery, I recommend holding off on new glasses for now     6. Patient was educated that they have vitreous floaters consistent with a posterior vitreous detachment in both eyes. I personally ordered and reviewed the OCT today which showed:   Normal, no signs of diabetic macular edema, trace ERM nasally/inf OS. There were no retinal holes or tears seen on dilated examination. Signs and symptoms of retinal detachment were reviewed (new/worsening flashes, floaters, loss of vision, curtain, etc.) and the patient will call for any changes. Unless changes noted, will monitor annually or sooner as needed.     Refer for cataract surgery consultation or sooner as needed  RTC 1 year for annual examination + OCT RNFL OU or sooner as needed

## 2023-01-27 NOTE — Patient Instructions (Signed)
Patient educated to use artificial tears such as refresh, blink, systane or soothe XP in both eyes as needed up to four times per day. If using >4x/day recommend preservative free artificial tears. Please aim for 4x/day at least 2 weeks prior to your next appointment    Patient educated on warm compresses with a warm washcloth or bruder mask on both eyelids 1x/day for 10 minutes.

## 2023-02-01 ENCOUNTER — Other Ambulatory Visit: Payer: Self-pay | Admitting: Internal Medicine

## 2023-02-01 DIAGNOSIS — N400 Enlarged prostate without lower urinary tract symptoms: Secondary | ICD-10-CM

## 2023-02-01 NOTE — Telephone Encounter (Signed)
Medication request routed to Dr. Jonny Ruiz for processing 02/01/2023 @9 :02 AM

## 2023-02-02 MED ORDER — TAMSULOSIN HCL 0.4 MG PO CAPS *I*
0.4000 mg | ORAL_CAPSULE | Freq: Every evening | ORAL | 0 refills | Status: DC
Start: 2023-02-02 — End: 2023-05-05

## 2023-02-03 ENCOUNTER — Telehealth: Payer: Self-pay | Admitting: Internal Medicine

## 2023-02-03 NOTE — Telephone Encounter (Signed)
Writer used the [employer] interpreter services to call patient to relay scheduled imaging appointment(s).     Patient is scheduled for an ultrasound thyroid/parathyroid AND a CT chest on 03-09-23 at 2:00 pm at [address] [facility]. NPO 2 hours.     Writer spoke with Jerrye Beavers, interpreter number [phone], who called the patient to relay the above imaging appointments details.     Writer relayed to patient's spouse the number for imaging, patient usually gets scans done here at [facility] and they are not sure of [address] location.

## 2023-02-05 ENCOUNTER — Other Ambulatory Visit: Payer: Self-pay

## 2023-02-05 ENCOUNTER — Encounter: Payer: Self-pay | Admitting: Cornea and External Diseases Specialist

## 2023-02-05 ENCOUNTER — Ambulatory Visit: Payer: 59 | Attending: Cornea and External Diseases Specialist | Admitting: Cornea and External Diseases Specialist

## 2023-02-05 DIAGNOSIS — H04123 Dry eye syndrome of bilateral lacrimal glands: Secondary | ICD-10-CM | POA: Insufficient documentation

## 2023-02-05 DIAGNOSIS — H43813 Vitreous degeneration, bilateral: Secondary | ICD-10-CM | POA: Insufficient documentation

## 2023-02-05 DIAGNOSIS — H40003 Preglaucoma, unspecified, bilateral: Secondary | ICD-10-CM | POA: Insufficient documentation

## 2023-02-05 DIAGNOSIS — H259 Unspecified age-related cataract: Secondary | ICD-10-CM | POA: Insufficient documentation

## 2023-02-05 DIAGNOSIS — H2513 Age-related nuclear cataract, bilateral: Secondary | ICD-10-CM | POA: Insufficient documentation

## 2023-02-05 NOTE — Progress Notes (Signed)
Outpatient Visit      Patient name: Tristan Mcbride  DOB: 1945/07/17       Age: 77 y.o.  MR#: [id number]    Encounter Date: 02/05/2023    Subjective:      Chief Complaint   Patient presents with    New Patient Visit     Cataract Consult      HPI     New Patient Visit     Additional comments: Cataract Consult            Comments        Tristan Mcbride is a 77 y.o. male is here for a new patient visit for a cataract     consultation.     Has glasses, but they are not with him.  Patient states that their vision is getting worse in both eyes,   experiencing increased blurriness.   Pt states that he sees things that are flying intermittently both eyes,   sometimes light in color. Pt states that he has light sensitivity. Pt   states that his eyes feels tired when he is not wearing his glasses, but   when his glasses are on his eyes are not tired.  Patient denies any eye pain, discomfort, burning, itching, tearing, or   foreign body sensation, or headaches.    Ocular meds:  Artifical tears PRN OU    Patient is diabetic.  Type of Diabetes: II  Duration of diabetes: 4 - 5 years per patient   Most recent BG: Does not take it.  Lab Results       Component                Value               Date                       HA1C                     6.2 (H)             09/25/2022                           Last edited by Sharin Mons, MD on 02/05/2023  1:54 PM.        has a current medication list which includes the following prescription(s): tamsulosin, lisinopril, tadalafil, atorvastatin, metformin, ketoconazole, senna, blood pressure monitor, automatic with arm cuff, metamucil smooth texture, and [DISCONTINUED] insulin glargine.     is allergic to tetanus toxoids and no known latex allergy.      Past Medical History:   Diagnosis Date    BPH (benign prostatic hypertrophy)     DM (diabetes mellitus) 10/13/2017    Esophageal reflux     Fibrolipoma     intramuscular adipose tissue    Hyperlipidemia     Hypertension     Hypoglycemia     Kidney  cyst     Rotator cuff tendinitis     Right    Sexual dysfunction     absent ejaculation    Type 2 diabetes mellitus     Weight loss       Past Surgical History:   Procedure Laterality Date    KNEE SURGERY      left    LEG TENDON SURGERY      Right        Specialty Problems  Ophthalmology Problems    DM (diabetes mellitus)            ROS    Positive for: Endocrine, Eyes  Last edited by Donnelly Stager on 02/05/2023  1:12 PM.         Objective:     Base Eye Exam       Visual Acuity (Snellen - Linear)         Right Left    Dist sc 20/80 -1+2 20/80 +2    Dist ph sc 20/50 -1+1 20/40 -1+1    Near sc J1 OU              Tonometry (Tonopen, 2:12 PM)         Right Left    Pressure 17 17              Pupils         Shape React    Right Round Brisk    Left Round Brisk              Visual Fields         Left Right     Full Full              Extraocular Movement         Right Left     Full Full              Neuro/Psych       Oriented x3: Yes    Mood/Affect: Normal              Dilation       Both eyes: 2.5% Phenylephrine, 1.0% Tropicamide, 0.5% Proparacaine @ 2:13 PM                  Slit Lamp and Fundus Exam       External Exam         Right Left    External Normal ocular adnexae, lacrimal gland & drainage, orbits Normal ocular adnexae, lacrimal gland & drainage, orbits              Slit Lamp Exam         Right Left    Lids/Lashes 1+ Meibomian gland dysfunction 1+ Meibomian gland dysfunction    Conjunctiva/Sclera Conjunctivochalasis, trace melanosis, trace mucous at caruncle trace melanosis, temp pinguecula, Conjunctivochalasis    Cornea trace reduced TBUT, trace inf PEE slightly irregular tear film, inferior 1+ PEE, Reduced tear film break up time    Anterior Chamber Clear & deep Clear & deep    Iris Normal shape, size, morphology Normal shape, size, morphology    Lens 2++ Nuclear sclerosis, few posterior vacoules 2++ Nuclear sclerosis, trace posterior vacoules    Anterior Vitreous Posterior vitreous detachment Posterior  vitreous detachment              Fundus Exam         Right Left    Disc Normal size, appearance, nerve fiber layer Normal size, appearance, nerve fiber layer    C/D Ratio 0.7 0.7    Macula Normal, trace ERM Normal, trace inf ERM    Vessels Normal (-) DR OU Normal    Periphery Normal, few peripheral floaters Normal, peripheral floaters                  Refraction       Wearing Rx         Sphere Cylinder Axis    Right -0.25 -  0.50 166    Left [place] -0.50 049      Type: SVL              Manifest Refraction         Sphere Cylinder Axis Add    Right -1.25 Sphere  +2.50    Left -1.25 -0.25 060 +2.50                            No annotated images are attached to the encounter.      Assessment/Plan:       1. Nuclear sclerosis of both eyes        2. Dry eye syndrome of both eyes        3. Glaucoma suspect of both eyes        4. Age-related cataract of both eyes, unspecified age-related cataract type  OCT, mac-OU    OCT, RNFL-OU    IOL Master-OU    Corneal Topography-OU    OCT, mac-OU    IOL Master-OU    Corneal Topography-OU    OCT, RNFL-OU         Patient referred by Dr Lady Saucier for cataract surgery eval    1. Visually significant cataract affecting activities of daily living  -Patient refers that vision is constantly blurry and affecting daly activity.  -BCVA OD 20/80   OS 20/80  -Cornea is clear      2. Dry eye OU    -We had a conversation about IOL options and we decided to move forward with a monofocal IOL implantation with a target for distance OU.    Options explained to the patient  1-Observe with medical management: this option will not improve vision and cataract will continue progressing   2-Cataract excision with posterior chamber Monofocal intra ocular lens implantation. Might need Iris hooks due to poor iris dilation. Vision blue to increase capsular visualization.  [place] to maintain pupillary dilation.     - Discussed R/B/A     including the risks of endophthalmitis, retinal detachment, choroidal hemorrhage,  anterior and posterior capsule tears, retained lens fragment, lens placement in sulcus or anterior chamber, vitreous loss, anterior vitrectomy, corneal decompensation, ptosis, CME, PCO requiring Nd:YAG capsulotomy, undercorrection and overcorrection of the refractive power, need for glasses after surgery, need for further surgery, and risks of anesthesia including coma and death. Patient the wishes to proceed with surgery       Tristan Mcbride decides to move forward with cataract removal with monofocal IOL implantation, trypan blue. Left eye first, 30 min, SG, MAC    IPPOC    Plan  Schedule surgery                  OCT, mac-OU          Right Eye  Findings include: Stable. Testing reliability good.     Left Eye  Findings include: Stable. Testing reliability good.     Notes  Images stored in Axis  JUSTINE OLSON           IOL Master-OU          Right Eye  Testing reliability good.     Left Eye  Testing reliability good.     Notes  Images stored in Axis    Provider reviewed [x]       If YES Provider Notification required Technician    OD No   OD Yes  OS No  OS Yes  AL < 23mm or > 26mm [x]  []  [x]  []    AL difference OD/OS > 0.65mm [x]  []  [x]  []    Astigmatism > 2.50 D [x]  []  [x]  []    K's < 41 or > 47 [x]  []  [x]  []    ACD < 2.2 or > 4.2 [x]  []  [x]  []    AL Measurement range > 0.54mm [x]  []  [x]  []    WTW < 10.2 or > 12.88mm [x]  []  [x]  []    CL use not D/C as instructed? [x]  []  [x]  []    Previous eye Surgery/Silicone Oil? [x]  []  [x]  []    Aphakic/pseudophakic? [x]  []  [x]  []    Manual Entry used in calculations? [x]  []  [x]  []        OD    OS    OU    JUSTINE OLSON                   Corneal Topography-OU          Right Eye  Findings include: Normal. Testing reliability good.     Left Eye  Findings include: Normal. Testing reliability good.     Notes  Images stored in Axis  JUSTINE OLSON           OCT, RNFL-OU          Right Eye  Findings include: Stable. Testing reliability good.     Left Eye  Findings include: Stable. Testing reliability good.      Notes  Images stored in Axis  JUSTINE OLSON                Patient was seen and examined.  Findings, assessment, and plan were discussed in detail with the patient, who verbalized understanding of and agreement with plan. All questions were answered. The patient was instructed to call or come in if there are any new symptoms or existing symptoms persist or worsen. To call if questions or concerns: [phone], number provided. Follow up was arranged. Certain parts of this note may have been carried over from prior Ophthalmology notes to maintain accuracy of patient's pertinent medical history and continuity of care. The details were verified and edited as appropriate.

## 2023-02-05 NOTE — Invasive Procedure Plan of Care (Signed)
CONSENT FOR MEDICAL  OR SURGICAL PROCEDURE                            Patient Name: Tristan Mcbride  [facility] [address]                                                              DOB: Sep 24, 1945         Please read this form or have someone read it to you.   It's important to understand all parts of this form. If something isn't clear, ask [place] to explain.   When you sign it, that means you understand the form and give [place] permission to do this surgery or procedure.     I agree for Sharin Mons, MD along with any assistants* they may choose, to treat the following condition(s): VISUALLY SIGNIFICANT CATARACT OF THE EYE (CLOUDY LENS)   By doing this surgery or procedure on me: CLOUDY LENS REMOVAL WITH PLACEMENT OF A LENS IMPLANT    LENS IMPLANT TYPE: IOL: Monofocal    GOAL FOR AFTER SURGERY BEST UNCORRECTED VISION: Distance           This is also known as: CATARACT EXTRACTION WITH PLACEMENT OF AN INTRAOCULAR LENS IMPLANT        Laterality: Left     *if you'd like a list of the assistants, please ask. We can give that to you.    1. The care provider has explained my condition to me. They have told me how the procedure can help me. They have told me about other ways of treating my condition. I understand the care provider cannot guarantee the result of the procedure. If I don't have this procedure, my other choices are: DEFER SURGERY AT THIS TIME WITH CONTINUED OBSERVATION          2. The care provider has told me the risks (problems that can happen) of the procedure. I understand there may be unwanted results. The risks that are related to this procedure include: BLEEDING, INFECTION, PROLONGED INFLAMMATION, PAIN, RAISED EYE PRESSURE, DETACHED RETINA, MACULAR EDEMA, CORNEAL EDEMA, RETAINED LENS FRAGMENTS, LID DROOP, TEMPORARY OR PERMANENT VISION LOSS, UNEXPECTED    NEED FOR GLASSES, NEED FOR ADDITIONAL SURGERY, IMPLANT COMPLICATIONS INCLUDING REMOVAL / REPOSITIONING / EXCHANGE, NEED TO PUT IN DIFFERENT LENS IMPLANT OTHER THAN  WHAT WAS CHOSEN, LENS RELATED VISUAL DISTURBANCES (DEPENDING UPON YOUR EYE AND THE TYPE OF IOL, THIS INCLUDES BUT IS NOT LIMITED TO INCREASED GLARE OR HALOS, DOUBLE VISION, GHOST IMAGES, IMPAIRED DEPTH PERCEPTION, BLURRY VISION, OR TROUBLE DRIVING AT NIGHT.), REACTION TO ANESTHESIA.    There is no guarantee that cataract surgery will improve your vision, it is possible that your vision could be made worse. Although unlikely, complications may occur weeks, months or even years later. These and other complications may result in poor or total loss of vision, or, in rare occasion, even loss of the eye. You may need additional treatment, procedures, or surgery to treat these complications.    3. I understand that during the procedure, my care provider may find a condition that we didn't know about before the treatment started. Therefore, I agree that my care provider can perform any other treatment which they think is necessary  and available.    4. I give permission to the hospital and/or its departments to examine and keep tissue, blood, body parts, fluids or materials removed from my body during the procedure(s) to aid in diagnosis and treatment, after which they may be used for scientific research or teaching by appropriate persons. If these materials are used for science or teaching, my identity will be protected. I will no longer own or have any rights to these materials regardless of how they may be used.    5. My care provider might want a representative from a medical device company to be there during my procedure. I understand that person works for:          The ways they might help my care provider during my procedure include:            6. Here are my decisions about receiving blood, blood products, or tissues. I understand my decisions cover the time before, during and after my procedure, my treatment, and my time in the hospital. After my procedure, if my condition changes a lot, my care provider will talk  with me again about receiving blood or blood products. At that time, my care provider might need me to review and sign another consent form, about getting or refusing blood.    I understand that the blood is from the community blood supply. Volunteers donated the blood, the volunteers were screened for health problems. The blood was examined with very sensitive and accurate tests to look for hepatitis, HIV/AIDS, and other diseases. Before I receive blood, it is tested again to make sure it is the correct type.    My chances of getting a sickness from blood products are small. But no transfusion is 100% safe. I understand that my care provider feels the good I will receive from the blood is greater than the chances of something going wrong. My care provider has answered my questions about blood products.      My decision  about blood or  blood products   Not applicable.        My decision   about tissue  Implants     Not applicable.          I understand this  form.    My care provider  or his/her  assistants have  explained:   What I am having done and why I need it.  What other choices I can make instead of having this done.  The benefits and possible risks (problems) to me of having this done.  The benefits and possible risks (problems) to me of receiving transplants, blood, or blood products.  There is no guarantee of the results.  The care provider may not stay with me the entire time that I am in the operating or procedure room.  My provider has explained how this may affect my procedure. My provider has answered my  questions about this.         I give my  permission for  this surgery or  procedure.            _______________________________________________                                     My signature  (or parent or other person authorized to sign for you, if you are unable to sign for  yourself or if you are  under 82 years old)        ______           Date        _____        Time   Electronic Signatures  will display at the bottom of the consent form.    Care provider's statement: I have discussed the planned procedure, including the possibility for transfusion of blood  products or receipt of tissue as necessary; expected benefits; the possible complications and risks; and possible alternatives  and their benefits and risks with the patients or the patient's surrogate. In my opinion, the patient or the patient's surrogate  understands the proposed procedure, its risks, benefits and alternatives.              Electronically signed by: Sharin Mons, MD                                                02/05/2023         Date        2:42 PM        Time

## 2023-02-10 ENCOUNTER — Ambulatory Visit: Payer: 59 | Admitting: Cornea and External Diseases Specialist

## 2023-03-09 ENCOUNTER — Other Ambulatory Visit: Payer: 59 | Admitting: Radiology

## 2023-03-09 ENCOUNTER — Other Ambulatory Visit: Payer: 59

## 2023-03-10 ENCOUNTER — Other Ambulatory Visit: Payer: Self-pay | Admitting: Internal Medicine

## 2023-03-10 DIAGNOSIS — I10 Essential (primary) hypertension: Secondary | ICD-10-CM

## 2023-03-10 MED ORDER — LISINOPRIL 20 MG PO TABS *I*
20.0000 mg | ORAL_TABLET | Freq: Every day | ORAL | 0 refills | Status: DC
Start: 2023-03-10 — End: 2023-06-11

## 2023-03-10 NOTE — Telephone Encounter (Signed)
Medication request routed to Dr. Jonny Ruiz for processing 03/10/2023 @4 :00 PM

## 2023-03-24 ENCOUNTER — Other Ambulatory Visit: Payer: Self-pay

## 2023-03-24 DIAGNOSIS — E785 Hyperlipidemia, unspecified: Secondary | ICD-10-CM

## 2023-03-24 MED ORDER — ATORVASTATIN CALCIUM 20 MG PO TABS *I*
20.0000 mg | ORAL_TABLET | Freq: Every day | ORAL | 0 refills | Status: DC
Start: 2023-03-24 — End: 2023-09-27

## 2023-03-24 NOTE — Telephone Encounter (Signed)
Dr. Ayesha Rumpf unavailable. Refill request routed to today's grouper Dr. Eula Listen for processing 03/24/2023 10:41 AM    If appropriate, please refill the script for 90 days.

## 2023-03-26 ENCOUNTER — Telehealth: Payer: Self-pay | Admitting: Cornea and External Diseases Specialist

## 2023-03-26 NOTE — Telephone Encounter (Signed)
Regarding Eye Surgery for PT: Tristan Mcbride, Tristan Mcbride    Your eye surgery has been scheduled with Dr. Sharin Mcbride at [facility].    Included you will see a [facility] Procedure Prep Brochure. This brochure will have specific instructions regarding what you need to do prior to surgery.  Please read it thoroughly and follow the instructions included.   *However, DO NOT stop taking any medications without instruction from the PERSCRIBING doctor.   *The prescreening nurse can answer any questions you may have when they call you 2-3 days prior to surgery.    Tuesday, April 20, 2023 - The [facility] Center will call you between 2:30 pm and 4:30 pm with your arrival time for day of surgery. They will leave a detailed message if you miss the call.    Preoperative Instructions:   Do not eat after midnight the night before surgery!  This includes candy, mints, gum, lozenges or chewing tobacco for patients over 18.  You can drink as much clear liquids as you'd like until 3 hours before your arrival time.    Allowed Clear Liquids: Water, Ginger Ale, Apple Juice or Gatorade (or Pedialyte for children) ONLY.  Do not consume any other liquids, no apple cider, no orange juice, no coffee.  Do not to wear eye make-up. Do not to wear dark nail polish. If you wear contact lenses, we ask that you please wear your glasses to your procedure and remove any false eyelashes if you have them.  Do not use any eye ointments the night before and the morning of surgery. You may use eye drops the night before and the morning of surgery.  Please bring your photo ID and insurance card. Leave all jewelry, credit cards, cash and other valuables at home.    Wednesday, April 21, 2023 - Surgery.  [facility] ([address], [place], [place] [address]) Tristan Mcbride will need a personal driver to take you home, ride shares like UBER and LYFT are not adequate forms of transportation after surgery.    Wednesday, April 28, 2023  3:30p - Please report to Dr. Adela Mcbride office for the follow up post- op appointment at [facility] ([address] [place], [place] [address]). You may drive yourself to your post op visit if you feel comfortable driving, if not then please have an adult driver accompany you.     If you have any questions, please feel free to call me at [phone] or e-mail me at [email or web address]  Thank You,  Tristan Mcbride  Ophthalmology Lead Surgical Scheduler   [facility] at [place]  Phone: [phone] Fax: [phone]  [address]  [place], [place] [address]    ICARE:  Retail buyer, Compassion, Accountability, Respect, Excellence    *Confidential Information Intended for Use of Addressee Only: This information has been disclosed to you from confidential records, which are protected by [facility] law HIPAA. State law prohibits you from making any further disclosure of this information without the specific written consent of the person to whom it pertains, or as otherwise permitted by law. A general authorization for the release of medical or other information is not sufficient authorization for further disclosure of information, which is protected by [facility] law, Article 27-F or Title 42 of the Code of Federal Regulations Any unauthorized further disclosure is violation of State law may result in a fine or jail sentence or both.

## 2023-04-02 ENCOUNTER — Telehealth: Payer: Self-pay | Admitting: Cornea and External Diseases Specialist

## 2023-04-02 NOTE — Telephone Encounter (Signed)
Regarding Eye Surgery for PT: Vivas, Paton    Your eye surgery has been scheduled with Dr. Sharin Mons at [facility].    Included you will see a [facility] Procedure Prep Brochure. This brochure will have specific instructions regarding what you need to do prior to surgery.  Please read it thoroughly and follow the instructions included.   *However, DO NOT stop taking any medications without instruction from the PERSCRIBING doctor.   *The prescreening nurse can answer any questions you may have when they call you 2-3 days prior to surgery.    Tuesday, May 04, 2023 - [facility] will call you between 2:30 pm and 4:30 pm with your arrival time for day of surgery. They will leave a detailed message if you miss the call.    Preoperative Instructions:   Do not eat after midnight the night before surgery!  This includes candy, mints, gum, lozenges or chewing tobacco for patients over 18.  You can drink as much clear liquids as you'd like until 3 hours before your arrival time.    Allowed Clear Liquids: Water, Ginger Ale, Apple Juice or Gatorade (or Pedialyte for children) ONLY.  Do not consume any other liquids, no apple cider, no orange juice, no coffee.  Do not to wear eye make-up. Do not to wear dark nail polish. If you wear contact lenses, we ask that you please wear your glasses to your procedure and remove any false eyelashes if you have them.  Do not use any eye ointments the night before and the morning of surgery. You may use eye drops the night before and the morning of surgery.  Please bring your photo ID and insurance card. Leave all jewelry, credit cards, cash and other valuables at home.    Wednesday, May 05, 2023 - Surgery.  [facility] ([address], [place], [place] [address]) [name] will need a personal driver to take you home, ride shares like UBER and LYFT are not adequate forms of transportation after surgery.    Wednesday, May 12, 2023 11:15a - Please report to Dr. Adela Glimpse office for the follow up post- op appointment at [facility] ([address] [place], [place] [address]). You may drive yourself to your post op visit as long as you feel comfortable driving, if not then please have an adult driver accompany you.           If you have any questions, please feel free to call me at [phone] or e-mail me at [email or web address]  Thank You,  Hermenia Fiscal  Ophthalmology Lead Surgical Scheduler   [facility] at [place]  Phone: [phone] Fax: [phone]  [address]  [place], [place] [address]    ICARE:  Retail buyer, Compassion, Accountability, Respect, Excellence    *Confidential Information Intended for Use of Addressee Only: This information has been disclosed to you from confidential records, which are protected by [facility] law HIPAA. State law prohibits you from making any further disclosure of this information without the specific written consent of the person to whom it pertains, or as otherwise permitted by law. A general authorization for the release of medical or other information is not sufficient authorization for further disclosure of information, which is protected by [facility] law, Article 27-F or Title 42 of the Code of Federal Regulations Any unauthorized further disclosure is violation of State law may result  in a fine or jail sentence or both.

## 2023-04-13 ENCOUNTER — Other Ambulatory Visit: Payer: Self-pay

## 2023-04-13 ENCOUNTER — Other Ambulatory Visit: Payer: 59

## 2023-04-13 ENCOUNTER — Ambulatory Visit
Admission: RE | Admit: 2023-04-13 | Discharge: 2023-04-13 | Disposition: A | Discharge status: Home / Self Care | Payer: 59 | Source: Ambulatory Visit | Attending: Internal Medicine | Admitting: Internal Medicine

## 2023-04-13 ENCOUNTER — Ambulatory Visit
Admission: RE | Admit: 2023-04-13 | Discharge: 2023-04-13 | Disposition: A | Discharge status: Home / Self Care | Payer: 59 | Source: Ambulatory Visit

## 2023-04-13 DIAGNOSIS — D440 Neoplasm of uncertain behavior of thyroid gland: Secondary | ICD-10-CM | POA: Insufficient documentation

## 2023-04-13 DIAGNOSIS — R911 Solitary pulmonary nodule: Secondary | ICD-10-CM

## 2023-04-13 DIAGNOSIS — E042 Nontoxic multinodular goiter: Secondary | ICD-10-CM | POA: Insufficient documentation

## 2023-04-15 ENCOUNTER — Other Ambulatory Visit: Payer: Self-pay

## 2023-04-16 ENCOUNTER — Telehealth: Payer: Self-pay | Admitting: Cornea and External Diseases Specialist

## 2023-04-16 NOTE — Telephone Encounter (Signed)
 With the assistance of Interpreter, spoke with PT, confirmed surgical dates and that nothing needed to be cancelled.

## 2023-04-16 NOTE — Telephone Encounter (Signed)
 Patient calling about cancelled appointment, needs interpreter, EM to call back and reschedule patient [phone]

## 2023-04-19 NOTE — Anesthesia Preprocedure Evaluation (Signed)
Anesthesia Pre-operative History and Physical for [facility]    Highlighted Issues for this Procedure:  78 y.o. 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  BMI Readings from Last 1 Encounters:  01/22/23 : 24.87 kg/m        .  Marland Kitchen  Anesthesia Evaluation Information Source: patient     ANESTHESIA HISTORY     Denies anesthesia history    GENERAL     Denies general issues    HEENT     Denies HEENT issues PULMONARY  Pertinent(-):  No asthma or shortness of breath    CARDIOVASCULAR  Good(4+METs) Exercise Tolerance    + Hypertension    GI/HEPATIC/RENAL   NPO: > 8hrs ago (solids) and > 2hrs ago (clears)      + GERD    + Renal Issues (cyst) NEURO/PSYCH/ORTHO     Denies neuro/psych/ortho issues    ENDO/OTHER    + Diabetes Mellitus          HgA1c: 6.2    HEMATOLOGIC    + Blood dyscrasia          hyperlipidemia       Physical Exam    Airway            Mouth opening: normal            Mallampati: I            TM distance (fb): >3 FB            Neck ROM: full  Dental   Normal Exam   Cardiovascular           Rhythm: regular           Rate: normal      Neurologic    Normal Exam  No sensory deficit and motor deficit    General Survey    Normal Exam   Pulmonary     breath sounds clear to auscultation    No cough, decreased breath sounds    Mental Status   Normal Exam    oriented to person, place and time           ________________________________________________________________________  PLAN  ASA Score  2  Anesthetic Plan MAC      Standard Attestation    Informed Consent     Risks:          Risks discussed were commensurate with the plan listed above with the following specific points: N/V, aspiration, sore throat and fatigue, Damage to: eyes, nerves and teeth, allergic Rx, unexpected serious injury, awareness and death.    Anesthetic Consent:          Anesthetic plan (and risks as noted above) were discussed with patient    Responsible Anesthesia Provider Attestation:  I attest that the patient or proxy understands and accepts the risks and benefits of the anesthesia plan. I also attest that I have personally performed a pre-anesthetic examination and evaluation, and prescribed the anesthetic plan for this particular location within 48 hours prior to the anesthetic as documented. Rogers Seeds, MD  04/21/23, 12:21 PM

## 2023-04-20 ENCOUNTER — Other Ambulatory Visit: Payer: Self-pay

## 2023-04-20 IMAGING — MR RM - Abdome Inferior ou Pelve
16 series · 16 of 16 positions shown · non-contrast
Comparison: none

[Series 4: ep2d_diff_b50_800_tra_p2_tracew · axial · 5.0mm · 1.41mm/px · 1 of 72 slices shown]
[im 1/72]
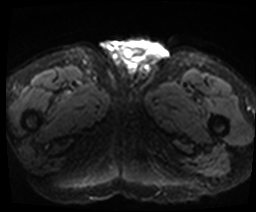

[Series 5: ep2d_diff_b50_800_tra_p2_adc · axial · 5.0mm · 1.41mm/px · 1 of 36 slices shown]
[im 1/36]
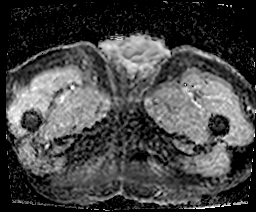

[Series 6: t2_blade_sag · sagittal · 4.0mm · 0.77mm/px · 1 of 36 slices shown]
[im 1/36]
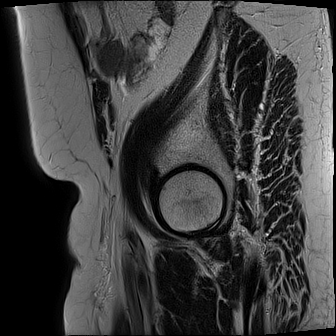

[Series 7: t2_blade_cor · coronal · 4.0mm · 0.76mm/px · 1 of 36 slices shown]
[im 1/36]
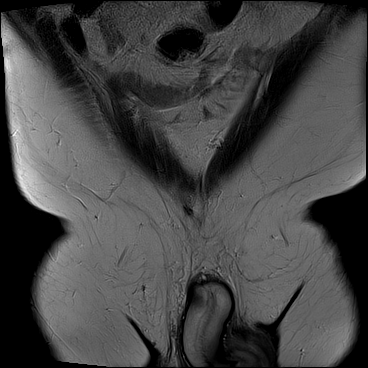

[Series 8: t2_blade_tra · axial · 4.0mm · 0.76mm/px · 1 of 42 slices shown]
[im 1/42]
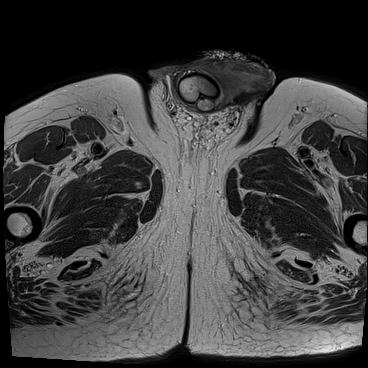

[Series 9: t1_tse_tra_p2 · axial · 4.0mm · 0.58mm/px · 1 of 42 slices shown]
[im 1/42]
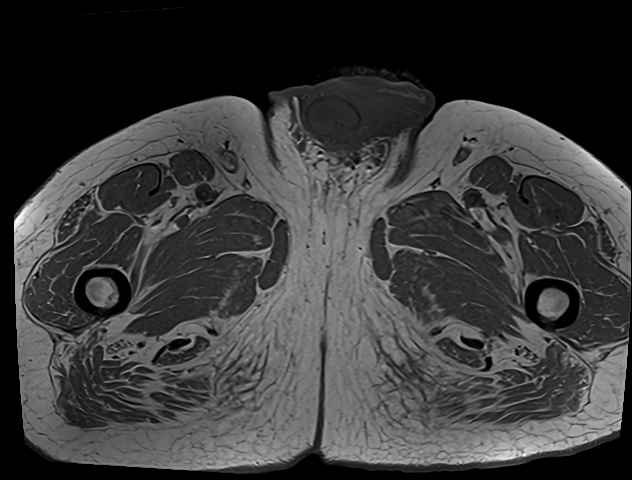

[Series 10: t1_tse_tra_fs_p2 · axial · 4.0mm · 0.67mm/px · 1 of 42 slices shown (1 of 3)]
[im 1/42]
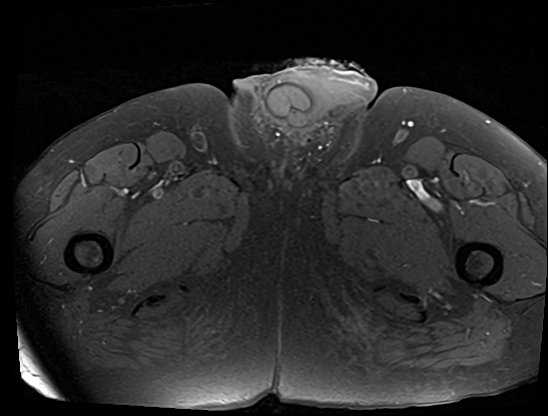

[Series 11: t2_tse_tra_p2 · axial · 3.0mm · 0.37mm/px · 1 of 40 slices shown]
[im 1/40]
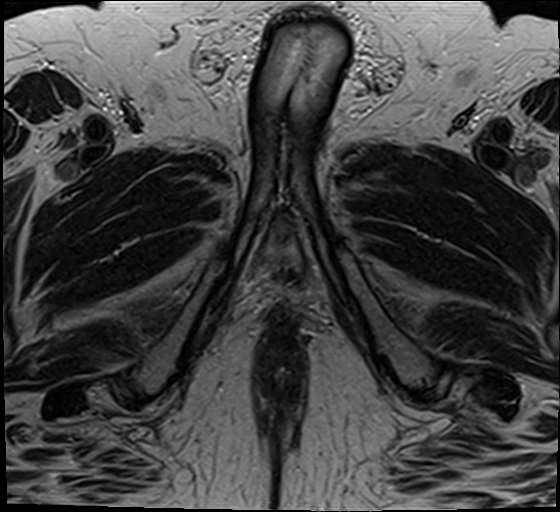

[Series 12: t2_tse_cor_p2 · coronal · 3.0mm · 0.40mm/px · 1 of 33 slices shown]
[im 1/33]
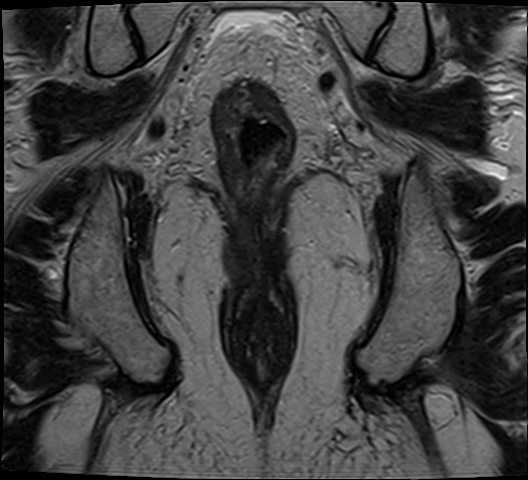

[Series 13: t1_vibe_dixon_tra_bh_pre_opp · axial · 2.3mm · 1.22mm/px · 1 of 98 slices shown]
[im 1/98]
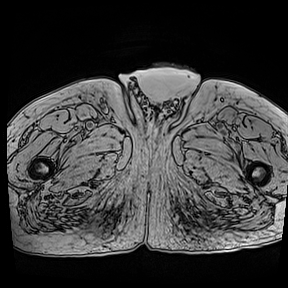

[Series 14: t1_vibe_dixon_tra_bh_pre_in · axial · 2.3mm · 1.22mm/px · 1 of 98 slices shown]
[im 1/98]
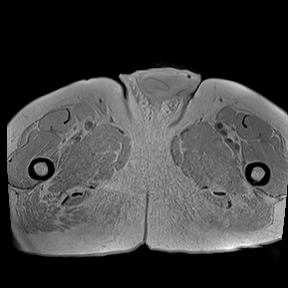

[Series 16: t1_vibe_dixon_tra_bh_pre_w · axial · 2.3mm · 1.22mm/px · 1 of 98 slices shown]
[im 1/98]
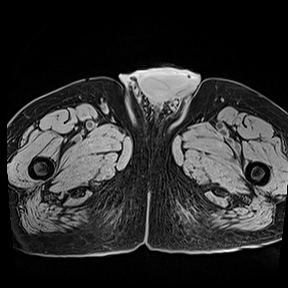

[Series 20: t1_vibe_dixon_tra_bh_pos_w · axial · 2.3mm · 1.22mm/px · 1 of 98 slices shown]
[im 1/98]
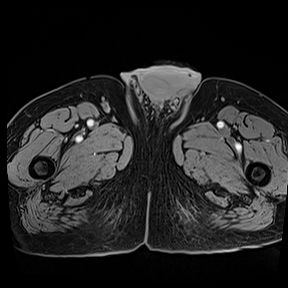

[Series 21: t1_tse_tra_fs_p2 · axial · 4.0mm · 0.62mm/px · 1 of 42 slices shown (2 of 3)]
[im 1/42]
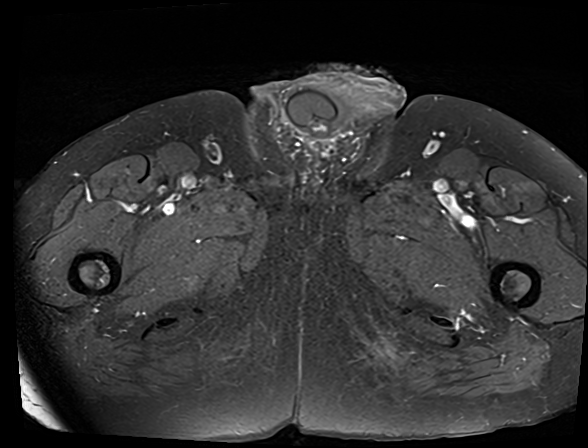

[Series 22: t1_tse_sag_fs_p2 · sagittal · 4.0mm · 0.58mm/px · 1 of 36 slices shown]
[im 1/36]
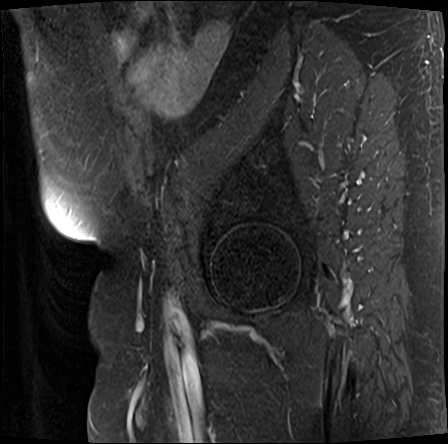

[Series 23: t1_tse_tra_fs_p2 · axial · 3.0mm · 0.45mm/px · 1 of 40 slices shown (3 of 3)]
[im 1/40]
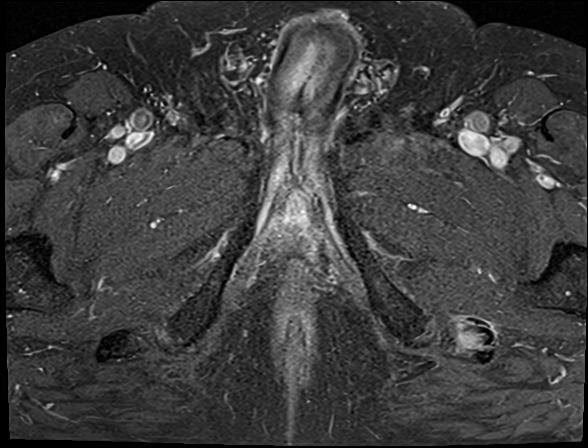

[16 of 16 positions shown; findings below may reference images not displayed]

RESSONÂNCIA MAGNÉTICA DE PELVE MASCULINA

TÉCNICA:
Exame realizado em equipamento de ressonância magnética com sequências, ponderações e planos específicos para o segmento de interesse, antes e após a administração endovenosa do meio de contraste.

RESULTADO:
Estruturas ósseas e musculatura regional íntegras.
Presença de extensa lesão expansiva comprometendo difusamente a próstata e estendendo-se ao assoalho vesical, medindo 4,6 x 4,6 cm nos eixos transversos, achado compatível com neoplasia de próstata, PI-RADS 5. 
Observa-se invasão da cápsula prostática, vesículas seminais e feixes vasculonervosos presença de pequena invasão da camada muscular anterior do reto baixo.
Ausência de linfonodomegalias, lesões expansivas ou coleções líquidas na pelve. Mama linha estruturas ósseas e partes moles sem sinais de lesões secundárias.
CONCLUSÃO: 
Presença de massa expansiva na próstata com invasão do assoalho vesical, vesículas seminais, cápsula prostática, feixes vasculonervosos e camada muscular da parede anterior do reto baixo, PI-RADS 5.

## 2023-04-20 NOTE — H&P (Signed)
General H&P for Inpatients    Chief Complaint: decreased vision left eye    History of Present Illness:  HPI    Past Medical History:   Diagnosis Date    BPH (benign prostatic hypertrophy)     DM (diabetes mellitus) 10/13/2017    Esophageal reflux     Fibrolipoma     intramuscular adipose tissue    Hyperlipidemia     Hypertension     Hypoglycemia     Kidney cyst     Rotator cuff tendinitis     Right    Sexual dysfunction     absent ejaculation    Type 2 diabetes mellitus     Weight loss      Past Surgical History:   Procedure Laterality Date    KNEE SURGERY      left    LEG TENDON SURGERY      Right     Family History   Problem Relation Age of Onset    Heart Disease Mother     Diabetes Mother     Diabetes Father     Stroke Father     Cerebral Palsy Father     Breast cancer Maternal Aunt     Colon cancer Neg Hx     Esophageal cancer Neg Hx     Liver cancer Neg Hx     Pancreatic Cancer Neg Hx     Rectal cancer Neg Hx     Stomach cancer Neg Hx      Social History     Socioeconomic History    Marital status: Married   Tobacco Use    Smoking status: Never    Smokeless tobacco: Never   Vaping Use    Vaping status: Never Used   Substance and Sexual Activity    Alcohol use: No    Drug use: No    Sexual activity: Yes     Partners: Female       Allergies:   Allergies   Allergen Reactions    Tetanus Toxoids      On Amb Int Med note from 08/20/09    No Known Latex Allergy        (Not in a hospital admission)     Current Outpatient Medications   Medication    acetaminophen (TYLENOL) 500 mg tablet    atorvastatin (LIPITOR) 20 mg tablet    lisinopril (PRINIVIL,ZESTRIL) 20 mg tablet    tamsulosin (FLOMAX) 0.4 mg capsule    tadalafil (CIALIS) 5 MG tablet    metFORMIN (GLUCOPHAGE) 500 mg tablet    senna (SENOKOT) 8.6 mg tablet    psyllium (METAMUCIL SMOOTH TEXTURE) 28.3 % POWD powder    ketoconazole (NIZORAL) 2 % cream    blood pressure monitor, automatic with arm cuff     No current facility-administered medications for this encounter.        Review of Systems:   ROS    Last Nursing documented pain:        No data found.         Physical Exam General: sitting in bed comfortably, NAD  HEENT: at/nc, please see prior ophthalmology note for full details on eye exam  Lungs: CTAB  CV: RRR, no murmur appreciated  Abd: +BS, soft, nt, nd  Ext: warm, well perfused, no edema  Neuro: grossly intact      Lab Results: none    Radiology Impressions (last 3 days):  No results found.    Currently Active/Followed [facility]  Problems:  There are no active hospital problems to display for this patient.      Assessment: cataract left eye    Plan: cataract removal with monofocal IOL implantation left eye    Author: Sharin Mons, MD  Note created: 04/20/2023  at: 12:57 PM

## 2023-04-21 ENCOUNTER — Encounter
Admission: RE | Disposition: A | Discharge status: Home / Self Care | Payer: Self-pay | Source: Ambulatory Visit | Attending: Cornea and External Diseases Specialist

## 2023-04-21 ENCOUNTER — Encounter: Payer: Self-pay | Admitting: Anesthesiology

## 2023-04-21 ENCOUNTER — Other Ambulatory Visit: Payer: Self-pay

## 2023-04-21 ENCOUNTER — Encounter: Payer: 59 | Admitting: Anesthesiology

## 2023-04-21 ENCOUNTER — Encounter: Payer: Self-pay | Admitting: Cornea and External Diseases Specialist

## 2023-04-21 ENCOUNTER — Ambulatory Visit
Admission: RE | Admit: 2023-04-21 | Discharge: 2023-04-21 | Disposition: A | Discharge status: Home / Self Care | Payer: 59 | Source: Ambulatory Visit | Attending: Cornea and External Diseases Specialist | Admitting: Cornea and External Diseases Specialist

## 2023-04-21 DIAGNOSIS — H524 Presbyopia: Secondary | ICD-10-CM | POA: Insufficient documentation

## 2023-04-21 DIAGNOSIS — H25812 Combined forms of age-related cataract, left eye: Secondary | ICD-10-CM | POA: Insufficient documentation

## 2023-04-21 DIAGNOSIS — K219 Gastro-esophageal reflux disease without esophagitis: Secondary | ICD-10-CM | POA: Insufficient documentation

## 2023-04-21 DIAGNOSIS — H579 Unspecified disorder of eye and adnexa: Secondary | ICD-10-CM | POA: Insufficient documentation

## 2023-04-21 DIAGNOSIS — Z7984 Long term (current) use of oral hypoglycemic drugs: Secondary | ICD-10-CM | POA: Insufficient documentation

## 2023-04-21 DIAGNOSIS — E1136 Type 2 diabetes mellitus with diabetic cataract: Secondary | ICD-10-CM | POA: Insufficient documentation

## 2023-04-21 DIAGNOSIS — H25813 Combined forms of age-related cataract, bilateral: Secondary | ICD-10-CM

## 2023-04-21 DIAGNOSIS — I10 Essential (primary) hypertension: Secondary | ICD-10-CM | POA: Insufficient documentation

## 2023-04-21 DIAGNOSIS — H52203 Unspecified astigmatism, bilateral: Secondary | ICD-10-CM | POA: Insufficient documentation

## 2023-04-21 HISTORY — PX: PR XCAPSL CTRC RMVL INSJ IO LENS PROSTH W/O ECP: 66984

## 2023-04-21 LAB — POCT GLUCOSE: Glucose POCT: 117 mg/dL — ABNORMAL HIGH (ref 60–99)

## 2023-04-21 SURGERY — PHACOEMULSIFICATION, CATARACT, EXTRACAPSULAR
Anesthesia: Monitor Anesthesia Care | Site: Eye | Laterality: Left | Wound class: Clean

## 2023-04-21 MED ORDER — ACETAMINOPHEN 325 MG PO TABS *I*
975.0000 mg | ORAL_TABLET | Freq: Once | ORAL | Status: AC
Start: 2023-04-21 — End: 2023-04-21
  Administered 2023-04-21: 975 mg via ORAL

## 2023-04-21 MED ORDER — PREDNISOLONE ACETATE 1 % OP SUSP *I*
OPHTHALMIC | Status: DC | PRN
Start: 2023-04-21 — End: 2023-04-21
  Administered 2023-04-21: 1 [drp] via OPHTHALMIC

## 2023-04-21 MED ORDER — EPINEPHRINE 1 MG/ML IJ SOLUTION WRAPPED *I*
INTRAOCULAR | Status: DC | PRN
Start: 2023-04-21 — End: 2023-04-21
  Administered 2023-04-21: 500 mL via INTRAOCULAR

## 2023-04-21 MED ORDER — PREDNISOLONE ACETATE 1 % OP SUSP *I*
1.0000 [drp] | Freq: Four times a day (QID) | OPHTHALMIC | 0 refills | Status: DC
Start: 2023-04-21 — End: 2023-06-09
  Filled 2023-04-21: qty 5, 25d supply, fill #0

## 2023-04-21 MED ORDER — BSS IO SOLN *I*
INTRAOCULAR | Status: DC | PRN
Start: 2023-04-21 — End: 2023-04-21
  Administered 2023-04-21: 15 mL via OPHTHALMIC

## 2023-04-21 MED ORDER — MIDAZOLAM HCL 1 MG/ML IJ SOLN *I* WRAPPED
INTRAMUSCULAR | Status: AC
Start: 2023-04-21 — End: 2023-04-21
  Filled 2023-04-21: qty 2

## 2023-04-21 MED ORDER — EPINEPHRINE PF/SF 1 MG/ML IJ SOLN *I*
INTRAMUSCULAR | Status: DC | PRN
Start: 2023-04-21 — End: 2023-04-21
  Administered 2023-04-21: .5 mg via INTRAOCULAR

## 2023-04-21 MED ORDER — MOXIFLOXACIN 0.5 MG/0.1 ML INTRACAMERAL INJECTION *I*
INTRACAMERAL | Status: DC | PRN
Start: 2023-04-21 — End: 2023-04-21
  Administered 2023-04-21: .5 mg via INTRACAMERAL

## 2023-04-21 MED ORDER — TROPICAMIDE 1 % OP SOLN *I*
1.0000 [drp] | OPHTHALMIC | Status: DC | PRN
Start: 2023-04-21 — End: 2023-04-22
  Administered 2023-04-21 (×3): 1 [drp] via OPHTHALMIC

## 2023-04-21 MED ORDER — LIDOCAINE HCL (PF) 1 % IJ SOLN *I*
INTRAMUSCULAR | Status: DC | PRN
Start: 2023-04-21 — End: 2023-04-21
  Administered 2023-04-21: .5 mL via INTRAOCULAR

## 2023-04-21 MED ORDER — MIDAZOLAM HCL 1 MG/ML IJ SOLN *I* WRAPPED
INTRAMUSCULAR | Status: DC | PRN
Start: 2023-04-21 — End: 2023-04-21
  Administered 2023-04-21: 1.5 mg via INTRAVENOUS

## 2023-04-21 MED ORDER — DEXMEDETOMIDINE HCL 200 MCG/2ML IV SOLN WRAPPED *I*
INTRAVENOUS | Status: DC | PRN
Start: 2023-04-21 — End: 2023-04-21
  Administered 2023-04-21: 4 ug via INTRAVENOUS

## 2023-04-21 MED ORDER — TETRACAINE HCL 0.5 % OP SOLN *I*
1.0000 [drp] | OPHTHALMIC | Status: DC | PRN
Start: 2023-04-21 — End: 2023-04-22
  Administered 2023-04-21 (×3): 1 [drp] via OPHTHALMIC

## 2023-04-21 MED ORDER — FENTANYL CITRATE 50 MCG/ML IJ SOLN *WRAPPED*
INTRAMUSCULAR | Status: DC | PRN
Start: 2023-04-21 — End: 2023-04-21
  Administered 2023-04-21: 50 ug via INTRAVENOUS

## 2023-04-21 MED ORDER — PHENYLEPHRINE HCL 10 % OP SOLN *I*
1.0000 [drp] | OPHTHALMIC | Status: DC | PRN
Start: 2023-04-21 — End: 2023-04-22
  Administered 2023-04-21 (×3): 1 [drp] via OPHTHALMIC

## 2023-04-21 MED ORDER — SODIUM CHLORIDE 0.9 % INJ (FLUSH) WRAPPED (FOR OSM ONLY) *I*
Status: DC | PRN
Start: 2023-04-21 — End: 2023-04-21
  Administered 2023-04-21: 10 mL via INTRAVENOUS

## 2023-04-21 MED ORDER — MOXIFLOXACIN HCL 0.5 % OP SOLN *I*
1.0000 [drp] | Freq: Four times a day (QID) | OPHTHALMIC | 0 refills | Status: DC
Start: 2023-04-21 — End: 2023-06-09
  Filled 2023-04-21: qty 3, 15d supply, fill #0

## 2023-04-21 MED ORDER — TETRACAINE HCL 0.5 % OP SOLN *I*
OPHTHALMIC | Status: AC
Start: 2023-04-21 — End: 2023-04-21
  Filled 2023-04-21: qty 1

## 2023-04-21 MED ORDER — PHENYLEPHRINE HCL 10 % OP SOLN *I*
OPHTHALMIC | Status: AC
Start: 2023-04-21 — End: 2023-04-21
  Filled 2023-04-21: qty 5

## 2023-04-21 MED ORDER — ACETAMINOPHEN 325 MG PO TABS *I*
ORAL_TABLET | ORAL | Status: AC
Start: 2023-04-21 — End: 2023-04-21
  Filled 2023-04-21: qty 3

## 2023-04-21 MED ORDER — POVIDONE-IODINE 5 % OP SOLN *I*
OPHTHALMIC | Status: DC | PRN
Start: 2023-04-21 — End: 2023-04-21
  Administered 2023-04-21: 30 mL via TOPICAL

## 2023-04-21 MED ORDER — LACTATED RINGERS IV SOLN *I*
20.0000 mL/h | INTRAVENOUS | Status: DC
Start: 2023-04-21 — End: 2023-04-22

## 2023-04-21 MED ORDER — LIDOCAINE HCL 1% IJ SOLN *WRAPPED* (FOR LINE PLACEMENT ONLY)
0.1000 mL | INTRAMUSCULAR | Status: DC | PRN
Start: 2023-04-21 — End: 2023-04-22

## 2023-04-21 MED ORDER — FENTANYL CITRATE 50 MCG/ML IJ SOLN *WRAPPED*
INTRAMUSCULAR | Status: AC
Start: 2023-04-21 — End: 2023-04-21
  Filled 2023-04-21: qty 2

## 2023-04-21 MED ORDER — TETRACAINE HCL 0.5 % OP SOLN *I*
OPHTHALMIC | Status: DC | PRN
Start: 2023-04-21 — End: 2023-04-21
  Administered 2023-04-21: 1 [drp] via TOPICAL

## 2023-04-21 MED ORDER — MOXIFLOXACIN HCL 0.5 % OP SOLN *I*
OPHTHALMIC | Status: DC | PRN
Start: 2023-04-21 — End: 2023-04-21
  Administered 2023-04-21: 1 [drp] via TOPICAL

## 2023-04-21 MED ORDER — TROPICAMIDE 1 % OP SOLN *I*
OPHTHALMIC | Status: AC
Start: 2023-04-21 — End: 2023-04-21
  Filled 2023-04-21: qty 3

## 2023-04-21 SURGICAL SUPPLY — 23 items
BAG BSS CENTURION 500ML (Drug) ×1 IMPLANT
CARTRIDGE D MONARCH III F/IOL (Supply) ×1 IMPLANT
DEVICE OPHTHALMIC 25GA 0.85ML VISCOSURGICAL HEALON ENDOCOAT (Supply) ×1 IMPLANT
DRAPE NONWOVEN 65X100 OVAL (Drape) ×1 IMPLANT
DRESSING TEGADERM W/LABEL 2 3/8 X 2 3/4 (Dressing) ×1 IMPLANT
GLOVE BIOGEL PI MICRO SZ 7.5 (Glove) ×2 IMPLANT
GOWN SIRIUS RAGLAN NONREINFORCED XL (Gown) ×1 IMPLANT
KNIFE OPTH 15 DEG STRAIGHT GREEN OPTIMUM (Supply) ×1 IMPLANT
KNIFE OPTH DIA2.4MM 45DEG SLIT W/HANDLE SHARP ANGLE POINT DELICATE DOUBLE BEVEL XSTAR (Supply) ×1 IMPLANT
KNIFE OPTH SIDEPORT 1MM ANGLED (Supply) IMPLANT
LENS  IOL N-PRELD +19.00D ENVISTA HYDROPHOBIC ACRYL (Implant) ×1 IMPLANT
PACK CATARACT PATIENT (Pack) ×1 IMPLANT
PACK CENTUR FMS ULTRA BAL 45DEG BEVEL UP (Pack) ×1 IMPLANT
PACK CUSTOM EYE SAWGRASS (Pack) ×1 IMPLANT
SET DISPOSABLE BIMANUAL POLYMER I/A (Supply) ×1 IMPLANT
SOL H2O IRRIG 500ML STERILE BTL (Solution) ×1 IMPLANT
SOL HEALON INJ 10MG .85ML PRO (Solution) IMPLANT
SOL TRYPAN .06PCT BLUE (Solution) IMPLANT
SPONGE EYE SPEAR 10PACK BLUE (Sponge) ×2 IMPLANT
SUTR ETHILON 10-0 CS175 BLACK (Suture) IMPLANT
SYRINGE LUERLOCK 1CC LF (Syringe) ×1 IMPLANT
SYRINGE LUERLOCK 30ML INDIVIDUAL WRAP (Syringe) ×1 IMPLANT
SYRINGE LUERLOCK 3ML INDIVIDUAL WRAP (Syringe) ×3 IMPLANT

## 2023-04-21 NOTE — Anesthesia Case Conclusion (Signed)
CASE CONCLUSION  Emergence  Criteria Used for Airway Removal:  Adequate Tv & RR and acceptable O2 saturation  Assessment:  Routine  Transport  Directly to: PACU  Airway:  Nasal cannula (pt maintaining own airway)  Oxygen Delivery:  2 lpm  Position:  Recumbent  Patient Condition on Handoff  Level of Consciousness:  Mildly sedated  Patient Condition:  Stable  Handoff Report to:  RN

## 2023-04-21 NOTE — Progress Notes (Signed)
Reviewed discharge instructions with in-person interpreter. Patient and family verbalized understanding and no further questions.

## 2023-04-21 NOTE — Discharge Instructions (Addendum)
Patient Instructions After Cataract Surgery    Name: Tristan Mcbride  DOB: [birth year]     Age: 78 y.o.  MRN: [id number]    Date: 04/21/2023    Procedure: Cataract extraction with placement of intraocular lens, left eye      Medications   - Please resume all of your oral / systemic medications at this time, including any blood thinners (aspirin, warfarin (Coumadin), or Plavix), if they were held prior to surgery   - Please start your postoperative eye drops (shake well):         Moxifloxacin  (Beige top)     Prednisolone Acetate   (Pink top)   Eye Shield   Day of Surgery Four times a day Four times a day All day   Week 1 Four times a day Four times a day At bedtime   Week 2 Four times a day Three times a day stop   Week 3 stop Twice a day stop   Week 4 stop Once a day stop          - You may also take Tylenol 325 mg four times daily as needed for discomfort    Activities   - The recovery for vision is variable depending on the amount of swelling in your eye. It may take several days to weeks for your vision to reach its' maximum. In general, vision is reasonable and allows most of our normal activities with only minor limitations.   - Avoid strenuous activity, heavy lifting or swimming for the first week after surgery.   - You may drive if the vision in your other eye is sufficient for safe driving and your surgical eye is comfortable.    Wound Care   - Wear the shield for the rest of the day and night of surgery.                - Please wear the shield only at night time on the day after surgery for the first week.               - You can shower starting on the day after surgery - avoid directly bathing the surgical eye with water.   - Use a moistened cotton ball to clean the debris from your lashes in the morning.               - Avoid bending over for the first week    Diet   - As usual    Questions or Concerns   - Patients usually experience some mild discomfort after surgery. If you have any discomfort after surgery, you  can take 2 tablets of Tylenol (325 mg per tablet) every four hours.   - If you develop significant pain, vomiting, redness, tearing, sensitivity to light, discharge, or loss of vision in your surgical eye, please call [phone] for an urgent appointment.   - After office hours, you will be connected to the answering service.     Laser Surgery after Cataract Surgery    In cataract surgery today, your natural lens is removed, leaving behind a membrane or capsule. This capsule supports the new artificial lens. Over time, the capsule may become cloudy and blur your vision. If this occurs, a laser may be used to create a pupil in the membrane. The laser is a simple procedure. However, it is only recommended if the haze develops in the capsule. You will be monitored for this occurrence after cataract surgery.  Authored by Sharin Mons, MD on 04/21/2023 at 8:41 AM    About your medications from today    [x]  Prescription information provided from onsite pharmacy.   []  Prescription information given to patient and/or patient representative, prescription not filled at onsite pharmacy.  []  No Prescription given.      Your last pain medication was given to you at: Tylenol given at 9 am    Your next dose of pain medication is due after: Tylenol ok to take after 3 pm

## 2023-04-21 NOTE — OR Nursing (Signed)
IOL calculation sheet unavailable, used surgeon's copy of sheet to verify lenses. Surgeon aware and decided to continue with procedure.

## 2023-04-21 NOTE — Anesthesia Procedure Notes (Signed)
---------------------------------------------------------------------------------------------------------------------------------------    AIRWAY   GENERAL INFORMATION AND STAFF    Patient location during procedure: OR       Date of Procedure: 04/21/2023 9:31 AM  CONDITION PRIOR TO MANIPULATION     Current Airway/Neck Condition:  Normal        For more airway physical exam details, see Anesthesia PreOp Evaluation  AIRWAY METHOD     Patient Position:  Sniffing    Preoxygenated: no      Maintained In-Line Stability: not needed, normal c-spine condition          To see details of medications used, see MAR    Induction: Not needed    Mask Difficulty Assessment:  0 - not attempted    Number of Attempts at Approach:  1  FINAL AIRWAY DETAILS    Final Airway Type:  Nasal cannula    Head position required to avoid obstruction:  Neutral  ----------------------------------------------------------------------------------------------------------------------------------------

## 2023-04-21 NOTE — Progress Notes (Signed)
Tristan Mcbride was seen after cataract surgery 04/21/23    After the speculum was removed, the anterior chamber of the operated eye was observed to be formed. The eye appears to have normal intraocular pressure by digital palpation.  In the postop recovery unit, the patient reports that their eye is comfortable.    Assessment and Plan:    The patient has been oriented to the use of drops in their discharge instructions and their followup appointment.

## 2023-04-21 NOTE — DOS Update (DOS) (Signed)
Day of Service   Surgeon/Proceduralist Documentation   for   Tomi Polzin's  Procedure(s) (LRB):  PHACO W/ IOL; CAPSULE STAIN; +/- IRIS HOOKS; +/- OMIDRIA (Left)    Documentation related to        History of Present Illness/Indication for Procedure        Review of Systems Specific to Indicated Procedure/Operative Site        Exam Specific to Indicated Procedure/Operative Site        Planned Procedure  can be found in document listed below or in Procedure Pass Linked Documents report.     Progress Notes by Sharin Mons, MD on 02/05/2023  1:15 PM    By signing this note, I attest that the information documented above is correct, reflecting the current state of the patient today, and that it is appropriate to proceed with the planned surgery/procedure.

## 2023-04-21 NOTE — Op Note (Signed)
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        Fluid Totals: No intake/output data recorded.       Estimated Blood Loss: 0 mL       Specimens to Pathology:  * No specimens in log *       Temporary Implants:        Packing:                 Patient Condition: good       Indications: Decreased vision left eye       Findings (Including unexpected complications): Cataract left eye     Description of Procedure: Description of Procedure:  Prior to the day of surgery, the risks, benefits, and alternatives of cataract surgery were discussed, including the risks and benefits and written informed consent was obtained.     On the day of surgery, the patient was examined in the pre-operative staging area, questions were answered, and the correct operative eye was marked. The eye was anesthestized with tetracaine drops and the pupil dilated with 2.5% Neo synephrine and 1% Mydriacyl x3. The patient was then transported to the operating room and was placed in the supine position.  A time out confirmed that the correct eye was being operated on. The lids and lashes were prepped with 5% Betadine solution and draped in a sterile ophthalmic fashion.     Two sideport incisions were made and trypan blue was injected into the eye, followed by non-preserved intracameral lidocaine and viscoelastic to expand the anterior chamber. A temporal incision was made with a keratome. A continuous curvilinear  capsulorhexis was initiated with a cystotome and completed with micro-Utrata forceps. Gentle hydrodissection was performed with BSS and the nucleus was rotated. The phacoemulsification handpiece was inserted into the eye and the lens fragments were separated and removed using a divide and conquer technique. The cortex was then removed with bimanual irrigation and aspiration. The capsular bag was filled with viscoelastic and the EE 19.00 Intraocular lens implant was injected into the posterior capsular bag and rotated to ensure good positioning.  I/A was used to remove the residual viscoelastic.  Moxifloxacin 0.5mg /0.104ml was injected intracamerally.  The wounds were hydrated and found to be water tight. A drop of Vigamox, and Pred Forte were placed on the ocular surface. The eye was shielded and the patient was escorted to the recovery room in good condition.      Signed:  Sharin Mons, MD  on 04/21/2023 at 9:51 AM

## 2023-04-21 NOTE — Anesthesia Postprocedure Evaluation (Signed)
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] Surgeon(s):  Sharin Mons, MD Responsible Anesthesia Provider:  Rogers [employer]eds, MD         Recovery Vitals  BP: 123/80 (04/21/2023 10:20 AM)  Heart Rate: 69 (04/21/2023  8:59 AM)  Heart Rate (via Pulse Ox): 58 (04/21/2023 10:20 AM)  Resp: 18 (04/21/2023  8:59 AM)  Temp: 36 C (96.8 F) (04/21/2023 10:33 AM)  SpO2: 97 % (04/21/2023 10:20 AM)   0-10 Scale: 0 (04/21/2023 10:20 AM)    Anesthesia type:  MAC  Complications Noted During Procedure or in PACU:  None   Comment:    Patient Location:  PACU  Level of Consciousness:    Recovered to baseline, awake, alert and oriented  Patient Participation:     Able to participate  Temperature Status:    Normothermic  Oxygen Saturation:    Within patient's normal range  Cardiac Status:   within patient's normal range  Fluid Status:    Stable  Airway Patency:     Yes  Pulmonary Status:    Baseline  Pain Management:    Adequate analgesia and satisfactory to patient  Nausea and Vomiting:  None    Post Op Assessment:    Tolerated procedure well  Responsible Anesthesia Provider Attestation:  All indicated post anesthesia care provided       -

## 2023-04-22 ENCOUNTER — Encounter: Payer: Self-pay | Admitting: Cornea and External Diseases Specialist

## 2023-04-22 ENCOUNTER — Encounter: Payer: 59 | Admitting: Cornea and External Diseases Specialist

## 2023-04-28 ENCOUNTER — Encounter: Payer: Self-pay | Admitting: Cornea and External Diseases Specialist

## 2023-04-28 ENCOUNTER — Other Ambulatory Visit: Payer: Self-pay

## 2023-04-28 ENCOUNTER — Ambulatory Visit: Payer: 59 | Attending: Cornea and External Diseases Specialist | Admitting: Cornea and External Diseases Specialist

## 2023-04-28 DIAGNOSIS — Z961 Presence of intraocular lens: Secondary | ICD-10-CM | POA: Insufficient documentation

## 2023-04-28 DIAGNOSIS — H11822 Conjunctivochalasis, left eye: Secondary | ICD-10-CM | POA: Insufficient documentation

## 2023-04-28 DIAGNOSIS — Z9842 Cataract extraction status, left eye: Secondary | ICD-10-CM | POA: Insufficient documentation

## 2023-04-28 DIAGNOSIS — H25811 Combined forms of age-related cataract, right eye: Secondary | ICD-10-CM

## 2023-04-28 NOTE — Patient Instructions (Addendum)
 SEMANA 1: Prednisolone (la rosada) tres veces al dia por una semana  [facility] 2: Dos veces al dia  [facility] 3: Pollyann Savoy al dia     Parar vigamox (el marron/cafe)      Lagrimas artificiales (Refresh Preservative free) 4 veces al dia

## 2023-04-28 NOTE — Progress Notes (Signed)
 Outpatient Visit      Patient name: Tristan Mcbride  DOB: 1945/12/08       Age: 78 y.o.  MR#: [id number]    Encounter Date: 04/28/2023    Subjective:      Chief Complaint   Patient presents with    Post-op     1 WK SP PHACO IOL OS       HPI     Post-op     Additional comments: 1 WK SP PHACO IOL OS             Comments    Adhrit Krenz is a 78 y.o. male here for 1 week SP PHACO IOL OS.   Pt reports foreign body sensation temporally OS. Pt denies any other pain.     Pt reports vision has improved.    BG: checked last week but does not recall number  Lab Results       Component                Value               Date                       HA1C                     6.2 (H)             09/25/2022            Ocular meds:  Artifical tears PRN OU  Vigamox QID OS  Pred Forte QID OS                   Last edited by Sharin Mons, MD on 04/28/2023  4:08 PM.        has a current medication list which includes the following prescription(s): moxifloxacin, prednisolone acetate, acetaminophen, atorvastatin, lisinopril, tamsulosin, tadalafil, metformin, ketoconazole, senna, blood pressure monitor, automatic with arm cuff, metamucil smooth texture, and [DISCONTINUED] insulin glargine.     is allergic to tetanus toxoids and no known latex allergy.      Past Medical History:   Diagnosis Date    BPH (benign prostatic hypertrophy)     DM (diabetes mellitus) 10/13/2017    Esophageal reflux     Fibrolipoma     intramuscular adipose tissue    Hyperlipidemia     Hypertension     Hypoglycemia     Kidney cyst     Rotator cuff tendinitis     Right    Sexual dysfunction     absent ejaculation    Type 2 diabetes mellitus     Weight loss       Past Surgical History:   Procedure Laterality Date    KNEE SURGERY      left    LEG TENDON SURGERY      Right    PR XCAPSL CTRC RMVL INSJ IO LENS PROSTH W/O ECP Left 04/21/2023    Procedure: PHACO W/ IOL; CAPSULE STAIN; +/- IRIS HOOKS; +/- OMIDRIA;  Surgeon: Sharin Mons, MD;  Location: [facility];  Service:  Ophthalmology        Specialty Problems          Ophthalmology Problems    DM (diabetes mellitus)                 Objective:     Base Eye Exam       Visual Acuity (  Snellen - Linear)         Right Left    Dist sc 20/150 +2 20/40 -2    Dist ph sc 20/100 +2 NI              Pupils         Dark Light Shape React APD    Right 4 2 Round Brisk None    Left 3 2 Round Brisk None              Neuro/Psych       Oriented x3: Yes    Mood/Affect: Normal                  Slit Lamp and Fundus Exam       Slit Lamp Exam         Right Left    Lids/Lashes  Normal structure & position    Conjunctiva/Sclera  conjunctivochalasis    Cornea  Normal epithelium, stroma, endothelium, tear film    Anterior Chamber  Clear & deep    Iris  Normal shape, size, morphology    Lens  Posterior chamber intraocular lens                  Refraction       Manifest Refraction (Auto)         Sphere Cylinder Axis Dist VA    Right        Left -1.00 +1.25 160 20/40              Manifest Refraction #2         Sphere Cylinder Axis Dist VA    Right        Left -1.75 +1.75 173 20/30 +2                            No annotated images are attached to the encounter.      Assessment/Plan:       1. S/P cataract extraction and insertion of intraocular lens, left             SP CEPCIOL OS 02/05/23  Doing good, happy     Biometry interpretation second eye  Good to move forward with second eye    Plan  Pred forte and vigamox continue as indicated                     Patient was seen and examined.  Findings, assessment, and plan were discussed in detail with the patient, who verbalized understanding of and agreement with plan. All questions were answered. The patient was instructed to call or come in if there are any new symptoms or existing symptoms persist or worsen. To call if questions or concerns: [phone], number provided. Follow up was arranged. Certain parts of this note may have been carried over from prior Ophthalmology notes to maintain accuracy of patient's  pertinent medical history and continuity of care. The details were verified and edited as appropriate.

## 2023-04-29 ENCOUNTER — Other Ambulatory Visit: Payer: Self-pay

## 2023-04-30 NOTE — Anesthesia Preprocedure Evaluation (Addendum)
 Anesthesia Pre-operative History and Physical for [facility]    Highlighted Issues for this Procedure:  78 y.o. 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  BMI Readings from Last 1 Encounters:  04/21/23 : 23.96 kg/m          Left eye completed on 04/21/23- no changes in health or issue with anesthesia      .  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    + Urinary Issues          BPH NEURO/PSYCH/ORTHO     Denies neuro/psych/ortho issues    ENDO/OTHER    + Diabetes Mellitus          HgA1c: 6.2    HEMATOLOGIC    + Blood dyscrasia          hyperlipidemia     Physical Exam    Airway            Mouth opening: normal            Mallampati: I            TM distance (fb): >3 FB            Neck ROM: full            Airway Impression: easy  Dental   Normal Exam   Cardiovascular           Rhythm: regular           Rate: normal         Pulmonary     breath sounds clear to auscultation    Mental Status     oriented to person, place and time         ________________________________________________________________________  PLAN  ASA Score  3  Anesthetic Plan MAC       General Anesthesia/Sedation Maintenance Plan (IV bolus); Airway (nasal cannula); Line ( use current access); Monitoring (standard ASA); Positioning (supine and arms tucked); Pain (per surgical team); PostOp  (ASC)Standard Attestation  Informed Consent     Risks:          Risks discussed were commensurate with the plan listed above with the following specific points: sore throat, Damage to:Marland Kitchen    Anesthetic Consent:         Anesthetic plan (and risks as noted above) were discussed with patient    Plan also discussed with team members including:       CRNA    Responsible Anesthesia Provider Attestation:  I attest that the patient or proxy understands and accepts the risks and benefits of the anesthesia plan. I also attest that I have personally  performed a pre-anesthetic examination and evaluation, and prescribed the anesthetic plan for this particular location within 48 hours prior to the anesthetic as documented. Kathlene Cote, MD  05/05/23, 8:11 AM

## 2023-05-04 ENCOUNTER — Other Ambulatory Visit: Payer: Self-pay

## 2023-05-04 NOTE — H&P (Signed)
 General H&P for Inpatients    Chief Complaint: decreased vision right eye    History of Present Illness:  HPI    Past Medical History:   Diagnosis Date    BPH (benign prostatic hypertrophy)     DM (diabetes mellitus) 10/13/2017    Esophageal reflux     Fibrolipoma     intramuscular adipose tissue    Hyperlipidemia     Hypertension     Hypoglycemia     Kidney cyst     Rotator cuff tendinitis     Right    Sexual dysfunction     absent ejaculation    Type 2 diabetes mellitus     Weight loss      Past Surgical History:   Procedure Laterality Date    KNEE SURGERY      left    LEG TENDON SURGERY      Right    PR XCAPSL CTRC RMVL INSJ IO LENS PROSTH W/O ECP Left 04/21/2023    Procedure: PHACO W/ IOL; CAPSULE STAIN; +/- IRIS HOOKS; +/- OMIDRIA;  Surgeon: Sharin Mons, MD;  Location: [facility];  Service: Ophthalmology     Family History   Problem Relation Age of Onset    Heart Disease Mother     Diabetes Mother     Diabetes Father     Stroke Father     Cerebral Palsy Father     Breast cancer Maternal Aunt     Colon cancer Neg Hx     Esophageal cancer Neg Hx     Liver cancer Neg Hx     Pancreatic Cancer Neg Hx     Rectal cancer Neg Hx     Stomach cancer Neg Hx      Social History     Socioeconomic History    Marital status: Married   Tobacco Use    Smoking status: Never    Smokeless tobacco: Never   Vaping Use    Vaping status: Never Used   Substance and Sexual Activity    Alcohol use: No    Drug use: No    Sexual activity: Yes     Partners: Female       Allergies:   Allergies   Allergen Reactions    Tetanus Toxoids      On Amb Int Med note from 08/20/09    No Known Latex Allergy        (Not in a hospital admission)     Current Outpatient Medications   Medication    moxifloxacin (VIGAMOX) 0.5 % ophthalmic solution    prednisoLONE acetate (PRED FORTE) 1 % ophthalmic suspension    acetaminophen (TYLENOL) 500 mg tablet    atorvastatin (LIPITOR) 20 mg tablet    lisinopril (PRINIVIL,ZESTRIL) 20 mg tablet    tamsulosin (FLOMAX)  0.4 mg capsule    tadalafil (CIALIS) 5 MG tablet    metFORMIN (GLUCOPHAGE) 500 mg tablet    ketoconazole (NIZORAL) 2 % cream    senna (SENOKOT) 8.6 mg tablet    blood pressure monitor, automatic with arm cuff    psyllium (METAMUCIL SMOOTH TEXTURE) 28.3 % POWD powder     No current facility-administered medications for this encounter.       Review of Systems:   ROS    Last Nursing documented pain:        No data found.         Physical Exam General: sitting in bed comfortably, NAD  HEENT: at/nc, please see  prior ophthalmology note for full details on eye exam  Lungs: CTAB  CV: RRR, no murmur appreciated  Abd: +BS, soft, nt, nd  Ext: warm, well perfused, no edema  Neuro: grossly intact      Lab Results: none    Radiology Impressions (last 3 days):  No results found.    Currently Active/Followed Hospital Problems:  There are no active hospital problems to display for this patient.      Assessment: cataract right eye    Plan: cataract removal with monofocal Iol implantation right eye    Author: Sharin Mons, MD  Note created: 05/04/2023  at: 3:02 PM

## 2023-05-04 NOTE — Invasive Procedure Plan of Care (Signed)
 CONSENT FOR MEDICAL  OR SURGICAL PROCEDURE                            Patient Name: Tristan Mcbride  [facility] [address]                                                              DOB: Mar 31, 1946         Please read this form or have someone read it to you.   It's important to understand all parts of this form. If something isn't clear, ask [place] to explain.   When you sign it, that means you understand the form and give [place] permission to do this surgery or procedure.     I agree for Sharin Mons, MD along with any assistants* they may choose, to treat the following condition(s): VISUALLY SIGNIFICANT CATARACT OF THE EYE (CLOUDY LENS)   By doing this surgery or procedure on me: CLOUDY LENS REMOVAL WITH PLACEMENT OF A LENS IMPLANT    LENS IMPLANT TYPE: IOL: Monofocal    GOAL FOR AFTER SURGERY BEST UNCORRECTED VISION: Distance           This is also known as: CATARACT EXTRACTION WITH PLACEMENT OF AN INTRAOCULAR LENS IMPLANT        Laterality: Right     *if you'd like a list of the assistants, please ask. We can give that to you.    1. The care provider has explained my condition to me. They have told me how the procedure can help me. They have told me about other ways of treating my condition. I understand the care provider cannot guarantee the result of the procedure. If I don't have this procedure, my other choices are: DEFER SURGERY AT THIS TIME WITH CONTINUED OBSERVATION          2. The care provider has told me the risks (problems that can happen) of the procedure. I understand there may be unwanted results. The risks that are related to this procedure include: BLEEDING, INFECTION, PROLONGED INFLAMMATION, PAIN, RAISED EYE PRESSURE, DETACHED RETINA, MACULAR EDEMA, CORNEAL EDEMA, RETAINED LENS FRAGMENTS, LID DROOP, TEMPORARY OR PERMANENT VISION LOSS, UNEXPECTED    NEED FOR GLASSES, NEED FOR ADDITIONAL SURGERY, IMPLANT COMPLICATIONS INCLUDING REMOVAL / REPOSITIONING / EXCHANGE, NEED TO PUT IN DIFFERENT LENS IMPLANT OTHER  THAN WHAT WAS CHOSEN, LENS RELATED VISUAL DISTURBANCES (DEPENDING UPON YOUR EYE AND THE TYPE OF IOL, THIS INCLUDES BUT IS NOT LIMITED TO INCREASED GLARE OR HALOS, DOUBLE VISION, GHOST IMAGES, IMPAIRED DEPTH PERCEPTION, BLURRY VISION, OR TROUBLE DRIVING AT NIGHT.), REACTION TO ANESTHESIA.    There is no guarantee that cataract surgery will improve your vision, it is possible that your vision could be made worse. Although unlikely, complications may occur weeks, months or even years later. These and other complications may result in poor or total loss of vision, or, in rare occasion, even loss of the eye. You may need additional treatment, procedures, or surgery to treat these complications.    3. I understand that during the procedure, my care provider may find a condition that we didn't know about before the treatment started. Therefore, I agree that my care provider can perform any other treatment which they think is necessary  and available.    4. I give permission to the hospital and/or its departments to examine and keep tissue, blood, body parts, fluids or materials removed from my body during the procedure(s) to aid in diagnosis and treatment, after which they may be used for scientific research or teaching by appropriate persons. If these materials are used for science or teaching, my identity will be protected. I will no longer own or have any rights to these materials regardless of how they may be used.    5. My care provider might want a representative from a medical device company to be there during my procedure. I understand that person works for:          The ways they might help my care provider during my procedure include:            6. Here are my decisions about receiving blood, blood products, or tissues. I understand my decisions cover the time before, during and after my procedure, my treatment, and my time in the hospital. After my procedure, if my condition changes a lot, my care provider will  talk with me again about receiving blood or blood products. At that time, my care provider might need me to review and sign another consent form, about getting or refusing blood.    I understand that the blood is from the community blood supply. Volunteers donated the blood, the volunteers were screened for health problems. The blood was examined with very sensitive and accurate tests to look for hepatitis, HIV/AIDS, and other diseases. Before I receive blood, it is tested again to make sure it is the correct type.    My chances of getting a sickness from blood products are small. But no transfusion is 100% safe. I understand that my care provider feels the good I will receive from the blood is greater than the chances of something going wrong. My care provider has answered my questions about blood products.      My decision  about blood or  blood products   Not applicable.        My decision   about tissue  Implants     Not applicable.          I understand this  form.    My care provider  or his/her  assistants have  explained:   What I am having done and why I need it.  What other choices I can make instead of having this done.  The benefits and possible risks (problems) to me of having this done.  The benefits and possible risks (problems) to me of receiving transplants, blood, or blood products.  There is no guarantee of the results.  The care provider may not stay with me the entire time that I am in the operating or procedure room.  My provider has explained how this may affect my procedure. My provider has answered my  questions about this.         I give my  permission for  this surgery or  procedure.            _______________________________________________                                     My signature  (or parent or other person authorized to sign for you, if you are unable to sign for  yourself or if you are  under 10 years old)        ______           Date        _____        Time   Electronic  Signatures will display at the bottom of the consent form.    Care provider's statement: I have discussed the planned procedure, including the possibility for transfusion of blood  products or receipt of tissue as necessary; expected benefits; the possible complications and risks; and possible alternatives  and their benefits and risks with the patients or the patient's surrogate. In my opinion, the patient or the patient's surrogate  understands the proposed procedure, its risks, benefits and alternatives.              Electronically signed by: Sharin Mons, MD                                                05/04/2023         Date        3:03 PM        Time

## 2023-05-05 ENCOUNTER — Encounter: Payer: 59 | Admitting: Anesthesiology

## 2023-05-05 ENCOUNTER — Ambulatory Visit
Admission: RE | Admit: 2023-05-05 | Discharge: 2023-05-05 | Disposition: A | Discharge status: Home / Self Care | Payer: 59 | Source: Ambulatory Visit | Attending: Cornea and External Diseases Specialist | Admitting: Cornea and External Diseases Specialist

## 2023-05-05 ENCOUNTER — Encounter
Admission: RE | Disposition: A | Discharge status: Home / Self Care | Payer: Self-pay | Source: Ambulatory Visit | Attending: Cornea and External Diseases Specialist

## 2023-05-05 ENCOUNTER — Encounter: Payer: Self-pay | Admitting: Anesthesiology

## 2023-05-05 ENCOUNTER — Other Ambulatory Visit: Payer: Self-pay

## 2023-05-05 ENCOUNTER — Other Ambulatory Visit: Payer: Self-pay | Admitting: Internal Medicine

## 2023-05-05 ENCOUNTER — Encounter: Payer: Self-pay | Admitting: Cornea and External Diseases Specialist

## 2023-05-05 DIAGNOSIS — H25811 Combined forms of age-related cataract, right eye: Secondary | ICD-10-CM

## 2023-05-05 DIAGNOSIS — E1136 Type 2 diabetes mellitus with diabetic cataract: Secondary | ICD-10-CM | POA: Insufficient documentation

## 2023-05-05 DIAGNOSIS — N400 Enlarged prostate without lower urinary tract symptoms: Secondary | ICD-10-CM

## 2023-05-05 DIAGNOSIS — H25813 Combined forms of age-related cataract, bilateral: Secondary | ICD-10-CM

## 2023-05-05 DIAGNOSIS — I10 Essential (primary) hypertension: Secondary | ICD-10-CM | POA: Insufficient documentation

## 2023-05-05 DIAGNOSIS — K219 Gastro-esophageal reflux disease without esophagitis: Secondary | ICD-10-CM | POA: Insufficient documentation

## 2023-05-05 HISTORY — PX: PR XCAPSL CTRC RMVL INSJ IO LENS PROSTH W/O ECP: 66984

## 2023-05-05 LAB — POCT GLUCOSE: Glucose POCT: 122 mg/dL — ABNORMAL HIGH (ref 60–99)

## 2023-05-05 SURGERY — PHACOEMULSIFICATION, CATARACT, EXTRACAPSULAR
Anesthesia: Monitor Anesthesia Care | Site: Eye | Laterality: Right | Wound class: Clean

## 2023-05-05 MED ORDER — TETRACAINE HCL 0.5 % OP SOLN *I*
OPHTHALMIC | Status: DC | PRN
Start: 2023-05-05 — End: 2023-05-05
  Administered 2023-05-05: 2 [drp] via TOPICAL

## 2023-05-05 MED ORDER — TROPICAMIDE 1 % OP SOLN *I*
1.0000 [drp] | OPHTHALMIC | Status: DC | PRN
Start: 2023-05-05 — End: 2023-05-06
  Administered 2023-05-05 (×3): 1 [drp] via OPHTHALMIC

## 2023-05-05 MED ORDER — CEFAZOLIN 1000 MG IN STERILE WATER 10ML SYRINGE *I*
2000.0000 mg | PREFILLED_SYRINGE | Freq: Once | INTRAVENOUS | Status: DC
Start: 2023-05-05 — End: 2023-05-06

## 2023-05-05 MED ORDER — SODIUM CHLORIDE 0.9 % INJ (FLUSH) WRAPPED (FOR OSM ONLY) *I*
Status: DC | PRN
Start: 2023-05-05 — End: 2023-05-05
  Administered 2023-05-05 (×3): 10 mL via INTRAVENOUS

## 2023-05-05 MED ORDER — PHENYLEPHRINE HCL 10 % OP SOLN *I*
OPHTHALMIC | Status: AC
Start: 2023-05-05 — End: 2023-05-05
  Filled 2023-05-05: qty 5

## 2023-05-05 MED ORDER — LIDOCAINE HCL (PF) 1 % IJ SOLN *I*
0.1000 mL | Freq: Once | INTRAMUSCULAR | Status: AC | PRN
Start: 2023-05-05 — End: 2023-05-05

## 2023-05-05 MED ORDER — FENTANYL CITRATE 50 MCG/ML IJ SOLN *WRAPPED*
INTRAMUSCULAR | Status: AC
Start: 2023-05-05 — End: 2023-05-05
  Filled 2023-05-05: qty 2

## 2023-05-05 MED ORDER — LIDOCAINE HCL (PF) 1 % IJ SOLN *I*
INTRAMUSCULAR | Status: DC | PRN
Start: 2023-05-05 — End: 2023-05-05
  Administered 2023-05-05: 1.2 mL via INTRAOCULAR

## 2023-05-05 MED ORDER — MOXIFLOXACIN HCL 0.5 % OP SOLN *I*
1.0000 [drp] | Freq: Four times a day (QID) | OPHTHALMIC | 0 refills | Status: DC | PRN
Start: 2023-05-05 — End: 2023-06-09
  Filled 2023-05-05: qty 3, 15d supply, fill #0

## 2023-05-05 MED ORDER — TROPICAMIDE 1 % OP SOLN *I*
OPHTHALMIC | Status: AC
Start: 2023-05-05 — End: 2023-05-05
  Filled 2023-05-05: qty 3

## 2023-05-05 MED ORDER — BSS IO SOLN *I*
INTRAOCULAR | Status: DC | PRN
Start: 2023-05-05 — End: 2023-05-05
  Administered 2023-05-05: 15 mL via OPHTHALMIC

## 2023-05-05 MED ORDER — HYDROMORPHONE HCL PF 1 MG/ML IJ SOLN *WRAPPED*
0.5000 mg | INTRAMUSCULAR | Status: DC | PRN
Start: 2023-05-05 — End: 2023-05-06

## 2023-05-05 MED ORDER — TAMSULOSIN HCL 0.4 MG PO CAPS *I*
0.4000 mg | ORAL_CAPSULE | Freq: Every evening | ORAL | 0 refills | Status: DC
Start: 2023-05-05 — End: 2023-07-26

## 2023-05-05 MED ORDER — POVIDONE-IODINE 5 % OP SOLN *I*
OPHTHALMIC | Status: DC | PRN
Start: 2023-05-05 — End: 2023-05-05
  Administered 2023-05-05: 30 mL via TOPICAL

## 2023-05-05 MED ORDER — PREDNISOLONE ACETATE 1 % OP SUSP *I*
OPHTHALMIC | Status: DC | PRN
Start: 2023-05-05 — End: 2023-05-05
  Administered 2023-05-05: 1 [drp] via OPHTHALMIC

## 2023-05-05 MED ORDER — ACETAMINOPHEN 325 MG PO TABS *I*
975.0000 mg | ORAL_TABLET | Freq: Once | ORAL | Status: AC
Start: 2023-05-05 — End: 2023-05-05
  Administered 2023-05-05: 975 mg via ORAL

## 2023-05-05 MED ORDER — TETRACAINE HCL 0.5 % OP SOLN *I*
1.0000 [drp] | OPHTHALMIC | Status: DC | PRN
Start: 2023-05-05 — End: 2023-05-06
  Administered 2023-05-05 (×3): 1 [drp] via OPHTHALMIC

## 2023-05-05 MED ORDER — ACETAMINOPHEN 325 MG PO TABS *I*
ORAL_TABLET | ORAL | Status: AC
Start: 2023-05-05 — End: 2023-05-05
  Filled 2023-05-05: qty 3

## 2023-05-05 MED ORDER — LACTATED RINGERS IV SOLN *I*
20.0000 mL/h | INTRAVENOUS | Status: DC
Start: 2023-05-05 — End: 2023-05-06

## 2023-05-05 MED ORDER — TETRACAINE HCL 0.5 % OP SOLN *I*
OPHTHALMIC | Status: AC
Start: 2023-05-05 — End: 2023-05-05
  Filled 2023-05-05: qty 1

## 2023-05-05 MED ORDER — DEXMEDETOMIDINE HCL 200 MCG/2ML IV SOLN WRAPPED *I*
INTRAVENOUS | Status: DC | PRN
Start: 2023-05-05 — End: 2023-05-05
  Administered 2023-05-05: 12 ug via INTRAVENOUS

## 2023-05-05 MED ORDER — ACETAMINOPHEN 160 MG/5 ML WRAPPED *I*
975.0000 mg | Freq: Once | Status: DC
Start: 2023-05-05 — End: 2023-05-06

## 2023-05-05 MED ORDER — PHENYLEPHRINE HCL 10 % OP SOLN *I*
1.0000 [drp] | OPHTHALMIC | Status: DC | PRN
Start: 2023-05-05 — End: 2023-05-06
  Administered 2023-05-05 (×2): 1 [drp] via OPHTHALMIC

## 2023-05-05 MED ORDER — MIDAZOLAM HCL 1 MG/ML IJ SOLN *I* WRAPPED
INTRAMUSCULAR | Status: DC | PRN
Start: 2023-05-05 — End: 2023-05-05
  Administered 2023-05-05: 2 mg via INTRAVENOUS

## 2023-05-05 MED ORDER — HYDROCODONE-ACETAMINOPHEN 5-325 MG PO TABS *I*
2.0000 | ORAL_TABLET | Freq: Once | ORAL | Status: DC | PRN
Start: 2023-05-05 — End: 2023-05-06

## 2023-05-05 MED ORDER — ACETAMINOPHEN 325 MG PO TABS *I*
975.0000 mg | ORAL_TABLET | Freq: Once | ORAL | Status: DC
Start: 2023-05-05 — End: 2023-05-06

## 2023-05-05 MED ORDER — PREDNISOLONE ACETATE 1 % OP SUSP *I*
1.0000 [drp] | Freq: Four times a day (QID) | OPHTHALMIC | 0 refills | Status: DC
Start: 2023-05-05 — End: 2023-06-09
  Filled 2023-05-05: qty 5, 25d supply, fill #0

## 2023-05-05 MED ORDER — OXYCODONE HCL 5 MG/5ML PO SOLN *I*
10.0000 mg | Freq: Once | ORAL | Status: DC | PRN
Start: 2023-05-05 — End: 2023-05-06

## 2023-05-05 MED ORDER — SODIUM CHLORIDE 0.9 % IV SOLN WRAPPED *I*
20.0000 mL/h | Status: DC
Start: 2023-05-05 — End: 2023-05-06

## 2023-05-05 MED ORDER — MOXIFLOXACIN HCL 0.5 % OP SOLN *I*
OPHTHALMIC | Status: DC | PRN
Start: 2023-05-05 — End: 2023-05-05
  Administered 2023-05-05: 1 [drp] via TOPICAL

## 2023-05-05 MED ORDER — MOXIFLOXACIN 0.5 MG/0.1 ML INTRACAMERAL INJECTION *I*
INTRACAMERAL | Status: DC | PRN
Start: 2023-05-05 — End: 2023-05-05
  Administered 2023-05-05: .5 mg via INTRACAMERAL

## 2023-05-05 MED ORDER — FENTANYL CITRATE 50 MCG/ML IJ SOLN *WRAPPED*
INTRAMUSCULAR | Status: DC | PRN
Start: 2023-05-05 — End: 2023-05-05
  Administered 2023-05-05: 50 ug via INTRAVENOUS

## 2023-05-05 MED ORDER — HYDROMORPHONE HCL PF 1 MG/ML IJ SOLN *WRAPPED*
0.2500 mg | INTRAMUSCULAR | Status: DC | PRN
Start: 2023-05-05 — End: 2023-05-06

## 2023-05-05 MED ORDER — HALOPERIDOL LACTATE 5 MG/ML IJ SOLN *I*
0.5000 mg | Freq: Once | INTRAMUSCULAR | Status: AC | PRN
Start: 2023-05-05 — End: 2023-05-05

## 2023-05-05 MED ORDER — PHENYLEPHRINE-KETOROLAC 1-0.3 % IO SOLN *OR USE ONLY*
INTRAOCULAR | Status: DC | PRN
Start: 2023-05-05 — End: 2023-05-05
  Administered 2023-05-05: 500 mL via INTRAOCULAR

## 2023-05-05 MED ORDER — HYDROCODONE-ACETAMINOPHEN 5-325 MG PO TABS *I*
1.0000 | ORAL_TABLET | Freq: Once | ORAL | Status: DC | PRN
Start: 2023-05-05 — End: 2023-05-06

## 2023-05-05 MED ORDER — MIDAZOLAM HCL 1 MG/ML IJ SOLN *I* WRAPPED
INTRAMUSCULAR | Status: AC
Start: 2023-05-05 — End: 2023-05-05
  Filled 2023-05-05: qty 2

## 2023-05-05 MED ORDER — LIDOCAINE HCL 1% IJ SOLN *WRAPPED* (FOR LINE PLACEMENT ONLY)
0.1000 mL | INTRAMUSCULAR | Status: DC | PRN
Start: 2023-05-05 — End: 2023-05-06

## 2023-05-05 MED ORDER — OXYCODONE HCL 5 MG/5ML PO SOLN *I*
5.0000 mg | Freq: Once | ORAL | Status: DC | PRN
Start: 2023-05-05 — End: 2023-05-12

## 2023-05-05 SURGICAL SUPPLY — 18 items
AGENT VISCOELASTIC HEALON DUET PRO DUAL PACK (Supply) ×1 IMPLANT
BAG BSS CENTURION 500ML (Drug) ×1 IMPLANT
DRAPE NONWOVEN 65X100 OVAL (Drape) ×1 IMPLANT
DRESSING TEGADERM W/LABEL 2 3/8 X 2 3/4 (Dressing) ×1 IMPLANT
GLOVE BIOGEL PI MICRO SZ 7.5 (Glove) ×5 IMPLANT
GOWN SIRIUS RAGLAN NONREINFORCED XL (Gown) ×2 IMPLANT
KNIFE OPTH 15 DEG STRAIGHT GREEN OPTIMUM (Supply) ×1 IMPLANT
KNIFE OPTH DIA2.4MM 45DEG SLIT W/HANDLE SHARP ANGLE POINT DELICATE DOUBLE BEVEL XSTAR (Supply) ×1 IMPLANT
LENS  IOL N-PRELD +19.00D ENVISTA HYDROPHOBIC ACRYL (Implant) ×1 IMPLANT
PACK CATARACT PATIENT (Pack) ×1 IMPLANT
PACK CENTUR FMS ULTRA BAL 45DEG BEVEL UP (Pack) ×1 IMPLANT
PACK CUSTOM EYE SAWGRASS (Pack) ×1 IMPLANT
SET DISPOSABLE BIMANUAL POLYMER I/A (Supply) ×1 IMPLANT
SOL H2O IRRIG 500ML STERILE BTL (Solution) ×1 IMPLANT
SPONGE EYE SPEAR 10PACK BLUE (Sponge) ×2 IMPLANT
SYRINGE LUERLOCK 1CC LF (Syringe) ×1 IMPLANT
SYRINGE LUERLOCK 30ML INDIVIDUAL WRAP (Syringe) ×1 IMPLANT
SYRINGE LUERLOCK 3ML INDIVIDUAL WRAP (Syringe) ×3 IMPLANT

## 2023-05-05 NOTE — Anesthesia Procedure Notes (Signed)
---------------------------------------------------------------------------------------------------------------------------------------    AIRWAY   GENERAL INFORMATION AND STAFF    Patient location during procedure: OR       Date of Procedure: 05/05/2023 8:26 AM  FINAL AIRWAY DETAILS    Final Airway Type:  Nasal cannula  ----------------------------------------------------------------------------------------------------------------------------------------

## 2023-05-05 NOTE — Op Note (Signed)
 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       Estimated Blood Loss: * No values recorded between 05/05/2023  8:24 AM and 05/05/2023  8:53 AM *       Specimens to Pathology:  * No specimens in log *       Temporary Implants:        Packing:                 Patient Condition: good       Indications: Decreased vision right eye       Findings (Including unexpected complications): Cataract right eye     Description of Procedure: Description of Procedure:  Prior to the day of surgery, the risks, benefits, and alternatives of cataract surgery were discussed, including the risks and benefits and written informed consent was obtained.     On the day of surgery, the patient was examined in the pre-operative staging area, questions were answered, and the correct operative eye was marked. The eye was anesthestized with tetracaine drops and the pupil dilated with 2.5% Neo synephrine and 1% Mydriacyl x3. The patient was then transported to the operating room and was placed in the supine position.  A time out confirmed that the correct eye was being operated on. The lids and lashes were prepped with 5% Betadine solution and draped in a sterile ophthalmic fashion.     Two sideport incisions were made and trypan blue was injected into the eye, followed by trypan blue, non-preserved intracameral lidocaine and viscoelastic to expand the  anterior chamber. A temporal incision was made with a keratome. A continuous curvilinear capsulorhexis was initiated with a cystotome and completed with micro-Utrata forceps. Gentle hydrodissection was performed with BSS and the nucleus was rotated. The phacoemulsification handpiece was inserted into the eye and the lens fragments were separated and removed using a divide and conquer technique. The cortex was then removed with bimanual irrigation and aspiration. The capsular bag was filled with viscoelastic and the EE 19.00 Intraocular lens implant was injected into the posterior capsular bag and rotated to ensure good positioning.  I/A was used to remove the residual viscoelastic.  Moxifloxacin 0.5mg /0.52ml was injected intracamerally.  The wounds were hydrated and found to be water tight. A drop of Vigamox, and Pred Forte were placed on the ocular surface. The eye was shielded and the patient was escorted to the recovery room in good condition.      Signed:  Sharin Mons, MD  on 05/05/2023 at 8:53 AM

## 2023-05-05 NOTE — DOS Update (DOS) (Signed)
 Day of Service   Surgeon/Proceduralist Documentation   for   Tristan Mcbride's  Procedure(s) (LRB):  PHACO W/ IOL; CAPSULE STAIN; +/- IRIS HOOKS; +/- OMIDRIA (Right)    Documentation related to        History of Present Illness/Indication for Procedure        Review of Systems Specific to Indicated Procedure/Operative Site        Exam Specific to Indicated Procedure/Operative Site        Planned Procedure  can be found in document listed below or in Procedure Pass Linked Documents report.     Progress Notes by Sharin Mons, MD on 02/05/2023  1:15 PM    By signing this note, I attest that the information documented above is correct, reflecting the current state of the patient today, and that it is appropriate to proceed with the planned surgery/procedure.

## 2023-05-05 NOTE — Telephone Encounter (Signed)
 Refill request routed to Dr. Jonny Ruiz   05/05/2023 9:30 AM

## 2023-05-05 NOTE — Anesthesia Postprocedure Evaluation (Signed)
 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] Surgeon(s):  Sharin Mons, MD  Levander Campion, MD Responsible Anesthesia Provider:  Kathlene Cote, MD         Recovery Vitals  BP: 104/67 (05/05/2023  9:26 AM)  Heart Rate: 63 (05/05/2023  8:56 AM)  Heart Rate (via Pulse Ox): 52 (05/05/2023  9:26 AM)  Resp: 18 (05/05/2023  9:26 AM)  Temp: 36 C (96.8 F) (05/05/2023  9:45 AM)  SpO2: 95 % (05/05/2023  9:26 AM)   0-10 Scale: 0 (05/05/2023  9:26 AM)    Anesthesia type:  MAC  Complications Noted During Procedure or in PACU:  None   Comment:    Patient Location:  ASC  Level of Consciousness:    Recovered to baseline, alert, oriented and awake  Patient Participation:     Able to participate  Temperature Status:    Normothermic  Oxygen Saturation:    Within patient's normal range  Cardiac Status:   within patient's normal range  Fluid Status:    Stable  Airway Patency:     Yes  Pulmonary Status:    Baseline  Pain Management:    Adequate analgesia  Nausea and Vomiting:  None    Post Op Assessment:    Tolerated procedure well  Responsible Anesthesia Provider Attestation:  All indicated post anesthesia care provided   -

## 2023-05-05 NOTE — Anesthesia Case Conclusion (Signed)
CASE CONCLUSION  Emergence  Assessment:  Routine  Transport  Directly to: PACU  Airway:  Nasal cannula  Patient Condition on Handoff  Level of Consciousness:  Alert/talking/calm  Patient Condition:  Stable  Handoff Report to:  RN

## 2023-05-05 NOTE — Discharge Instructions (Addendum)
 Patient Instructions After Cataract Surgery    Name: Tristan Mcbride  DOB: [birth year]     Age: 78 y.o.  MRN: [id number]    Date: 05/05/2023    Procedure: Cataract extraction with placement of intraocular lens, right eye        Medications   - Please resume all of your oral / systemic medications at this time, including any blood thinners (aspirin, warfarin (Coumadin), or Plavix), if they were held prior to surgery   - Please start your postoperative eye drops (shake well):         Moxifloxacin  (Beige top)     Prednisolone Acetate   (Pink top)   Eye Shield   Day of Surgery Four times a day Four times a day All day   Week 1 Four times a day Four times a day At bedtime   Week 2 Four times a day Three times a day stop   Week 3 stop Twice a day stop   Week 4 stop Once a day stop          - You may also take Tylenol 325 mg four times daily as needed for discomfort    Activities   - The recovery for vision is variable depending on the amount of swelling in your eye. It may take several days to weeks for your vision to reach its' maximum. In general, vision is reasonable and allows most of our normal activities with only minor limitations.   - Avoid strenuous activity, heavy lifting or swimming for the first week after surgery.   - You may drive if the vision in your other eye is sufficient for safe driving and your surgical eye is comfortable.    Wound Care   - Wear the shield for the rest of the day and night of surgery.                - Please wear the shield only at night time on the day after surgery for the first week.               - You can shower starting on the day after surgery - avoid directly bathing the surgical eye with water.   - Use a moistened cotton ball to clean the debris from your lashes in the morning.               - Avoid bending over for the first week    Diet   - As usual    Questions or Concerns   - Patients usually experience some mild discomfort after surgery. If you have any discomfort after surgery,  you can take 2 tablets of Tylenol (325 mg per tablet) every four hours.   - If you develop significant pain, vomiting, redness, tearing, sensitivity to light, discharge, or loss of vision in your surgical eye, please call [phone] for an urgent appointment.   - After office hours, you will be connected to the answering service.     Laser Surgery after Cataract Surgery    In cataract surgery today, your natural lens is removed, leaving behind a membrane or capsule. This capsule supports the new artificial lens. Over time, the capsule may become cloudy and blur your vision. If this occurs, a laser may be used to create a pupil in the membrane. The laser is a simple procedure. However, it is only recommended if the haze develops in the capsule. You will be monitored for this occurrence after cataract  surgery.    Authored by Sharin Mons, MD on 05/05/2023 at 8:11 AM      About your medications from today    [x]  Prescription information provided from onsite pharmacy.   []  Prescription information given to patient and/or patient representative, prescription not filled at onsite pharmacy.  []  No Prescription given.      Your last pain medication was given to you at: tylenol at 745am    Your next dose of pain medication is due after: tylenol can be taken at 145pm

## 2023-05-05 NOTE — Progress Notes (Signed)
 Kiwan Gadsden was seen after cataract surgery 05/05/23    After the speculum was removed, the anterior chamber of the operated eye was observed to be formed. The eye appears to have normal intraocular pressure by digital palpation.  In the postop recovery unit, the patient reports that their eye is comfortable.    Assessment and Plan:    The patient has been oriented to the use of drops in their discharge instructions and their followup appointment.

## 2023-05-06 ENCOUNTER — Encounter: Payer: Self-pay | Admitting: Cornea and External Diseases Specialist

## 2023-05-07 ENCOUNTER — Ambulatory Visit: Payer: 59 | Attending: Internal Medicine

## 2023-05-07 ENCOUNTER — Other Ambulatory Visit: Payer: Self-pay

## 2023-05-07 DIAGNOSIS — Z1211 Encounter for screening for malignant neoplasm of colon: Secondary | ICD-10-CM

## 2023-05-07 DIAGNOSIS — R911 Solitary pulmonary nodule: Secondary | ICD-10-CM

## 2023-05-07 DIAGNOSIS — Z23 Encounter for immunization: Secondary | ICD-10-CM

## 2023-05-07 NOTE — Patient Instructions (Addendum)
 Call to schedule the DEXA scan to check your bone health: [phone]     Please call this phone number to schedule an appointment with endocrinology as soon as possible: [phone]     Please schedule your CT scan of your chest in April.

## 2023-05-07 NOTE — Progress Notes (Signed)
 Received a high dose of flu  vaccine in left  deltoid. Tolerated well.

## 2023-05-07 NOTE — Progress Notes (Signed)
 [facility] Office Note:    Subjective     Tristan Mcbride is a 78 y.o. male with PMHx below but most significant for hypertension, type 2 diabetes mellitus, who presents to clinic today to discuss the following topics:     Today, Tristan Mcbride is examined with the assistance of a [employer] Spanish language interpreter.    Since his last visit, Tristan Mcbride got cataract removal in both eyes by ophthalmology and feels that he is recovering well from his procedure and his vision is improved.  He is scheduled for follow-up with them on 1/29.  He reports no other changes to his health since last visit.    We discussed the ongoing workup for his hypercalcemia and discussed the imaging findings of his ultrasound of his thyroid/parathyroid as well as CT scan of his chest.  He is amenable to getting a follow-up CT scan in 3 months.  He has not yet scheduled endocrinology appointment but I encouraged him to call their office to schedule an appointment and will provide the phone number in his patient instructions.    Denies symptoms of hypocalcemia such as bone pain, muscle weakness, difficulty concentrating, and does not feel constipated though he reports having 2 bowel movements per week which is his baseline.  He says his stools are not very hard and he does not need to strain.  He does not feel bloated or have abdominal pain.  He continues to use fiber supplements.    He cannot remember the specific year of his last colonoscopy.  Chart review suggests possibly 2016, with recommended follow-up in 2021 which was not completed. He says he his mother passed away with cancer but is unsure if pancreatic cancer or colon cancer. Denies hematochezia, melena. He has bowel movements about twice per week which he says is typical for him.     He is amenable to getting a flu shot today.    Medications & Allergies     Current Outpatient Medications   Medication Sig    moxifloxacin (VIGAMOX) 0.5 % ophthalmic solution Use as directed in  discharge instructions.  For refills, contact doctor's office.    prednisoLONE acetate (PRED FORTE) 1 % ophthalmic suspension Use as directed in discharge instructions.  For refills, contact doctor's office.    tamsulosin (FLOMAX) 0.4 mg capsule Take 1 capsule (0.4 mg total) by mouth every evening.    moxifloxacin (VIGAMOX) 0.5 % ophthalmic solution Place 1 drop into the left eye 4 times daily as directed in discharge instructions.  For refills, contact doctor's office.    prednisoLONE acetate (PRED FORTE) 1 % ophthalmic suspension Place 1 drop into the left eye 4 times daily  as directed in discharge instructions.  For refills, contact doctor's office.    acetaminophen (TYLENOL) 500 mg tablet 1 tablet as needed Orally every 6 hrs for 5 days    atorvastatin (LIPITOR) 20 mg tablet Take 1 tablet (20 mg total) by mouth daily (with dinner).    lisinopril (PRINIVIL,ZESTRIL) 20 mg tablet Take 1 tablet (20 mg total) by mouth daily.    tadalafil (CIALIS) 5 MG tablet Take 1 tablet (5 mg total) by mouth daily.    metFORMIN (GLUCOPHAGE) 500 mg tablet Take 1 tablet (500 mg total) by mouth 2 times daily (with meals)    blood pressure monitor, automatic with arm cuff Use as directed to monitor blood pressure    psyllium (METAMUCIL SMOOTH TEXTURE) 28.3 % POWD powder 1 tablespoon in 8 oz of  water daily by mouth.    ketoconazole (NIZORAL) 2 % cream Apply topically daily (Patient not taking: Reported on 05/07/2023)    senna (SENOKOT) 8.6 mg tablet Take 2 tablets by mouth daily (Patient not taking: Reported on 05/07/2023)        He is allergic to tetanus toxoids.      Past Medical & Surgical History     Past Medical History:   Diagnosis Date    BPH (benign prostatic hypertrophy)     DM (diabetes mellitus) 10/13/2017    Esophageal reflux     Fibrolipoma     intramuscular adipose tissue    Hyperlipidemia     Hypertension     Hypoglycemia     Kidney cyst     Rotator cuff tendinitis     Right    Sexual dysfunction     absent ejaculation     Type 2 diabetes mellitus     Weight loss      Past Surgical History:   Procedure Laterality Date    KNEE SURGERY      left    LEG TENDON SURGERY      Right    PR XCAPSL CTRC RMVL INSJ IO LENS PROSTH W/O ECP Left 04/21/2023    Procedure: PHACO W/ IOL; CAPSULE STAIN; +/- IRIS HOOKS; +/- OMIDRIA;  Surgeon: Sharin Mons, MD;  Location: [facility];  Service: Ophthalmology    PR XCAPSL CTRC RMVL INSJ IO LENS PROSTH W/O ECP Right 05/05/2023    Procedure: PHACO W/ IOL; CAPSULE STAIN;  + OMIDRIA;  Surgeon: Sharin Mons, MD;  Location: [facility];  Service: Ophthalmology         Social & Family History     Family History   Problem Relation Age of Onset    Heart Disease Mother     Diabetes Mother     Diabetes Father     Stroke Father     Cerebral Palsy Father     Breast cancer Maternal Aunt     Colon cancer Neg Hx     Esophageal cancer Neg Hx     Liver cancer Neg Hx     Pancreatic Cancer Neg Hx     Rectal cancer Neg Hx     Stomach cancer Neg Hx      Social History     Socioeconomic History    Marital status: Married   Tobacco Use    Smoking status: Never    Smokeless tobacco: Never   Vaping Use    Vaping status: Never Used   Substance and Sexual Activity    Alcohol use: No    Drug use: No    Sexual activity: Yes     Partners: Female         Vitals & Physical Exam     Current Vitals Vitals Range (24 Hours)   BP 134/70   Pulse 74   Temp 35.8 C (96.4 F) (Temporal)   Wt 70.3 kg (155 lb)   SpO2 96%   BMI 24.28 kg/m  Temp:  [35.8 C (96.4 F)] 35.8 C (96.4 F)  Heart Rate:  [74] 74  BP: (134-146)/(70-81) 134/70     Physical Exam:  General: Patient sitting comfortably in chair. Calm, cooperative, in no acute distress.  HEENT: NC/AT, anicteric.  Neck: no JVD.  Cardiovascular: RRR, no m/r/g, normal S1 and S2.  Pulmonary: NWOB, CTAB.  Abdominal: Soft, NTND, +ve BS.  Neuro: Alert and oriented x3. Speech is fluent, comprehension is  intact.      Assessment & Plan       This patient's hypercalcemia may be multifactorial  based on pulmonary lab findings.  May have primary hyperparathyroidism given his inappropriately normal PTH.  However, PTH related protein of 3 is elevated and coexisting paraneoplastic process cannot be excluded. May benefit from nuclear medicine scan of parathyroid to rule out adenoma but will defer to endocinology regarding further evaluation and work up. Will pursue follow up CT Chest for left lung nodule. Other causes such as multiple myeloma, familial hypocalciuric hypercalcemia, granulomatous disease are less likely based on labs.      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  -CT Chest w/o contrast showed left lung nodule; ordered 3 month follow up scan     Primary hyperparathyroidism  -thyroid/parathyroid ultrasound showed no definite parathyroid nodule but did show one thyroid nodule that would be amenable to FNA  -Encouraged patient to schedule previously ordered DEXA scan (phone number provided)  -Encouraged patient to call endocrinology to schedule an appointment (phone number provided)     # Thyroid Nodule  -Defer management to endocrinology    #Type 2 Diabetes mellitus  -Urine microalbumin/creatinine completed February 2024, will order at next visit  -Diabetic foot exam completed February 2024, will complete at next visit    # Colorectal cancer screening  Referral placed today      Not discussed today:  #Benign Prostatic Hyperplasia   -Continue with tamsulosin 0.4 mg nightly & Cialis 5 mg daily PRN.  -Follows with Urology     #Chronic Kidney Disease, Stage 3A  -Follow CMP    Health Maintenance   Abdominal Aortic Aneurysm (one-time screening in men 65-75 with history of tobacco use) - n/a  Breast Cancer (biennial screening in women 40-74) - n/a   Cervical Cancer (Pap smear every 3 years in women 21-65) - n/a  Colorectal Cancer (colonoscopy every 10 years in adults 45-75) - last in 2016, due for colonoscopy in  2021  Diabetes (annual A1c in overweight or adults >45) - 6.2 in June 2024  Immunizations - Flu today, shingles and TDAP at next visit  Lipid Disorder (annual screening in adults with increased CHD risk) - Feb 20

## 2023-05-12 ENCOUNTER — Ambulatory Visit: Payer: 59 | Attending: Cornea and External Diseases Specialist | Admitting: Cornea and External Diseases Specialist

## 2023-05-12 ENCOUNTER — Other Ambulatory Visit: Payer: Self-pay

## 2023-05-12 DIAGNOSIS — Z961 Presence of intraocular lens: Secondary | ICD-10-CM | POA: Insufficient documentation

## 2023-05-12 DIAGNOSIS — Z9842 Cataract extraction status, left eye: Secondary | ICD-10-CM | POA: Insufficient documentation

## 2023-05-12 DIAGNOSIS — Z9841 Cataract extraction status, right eye: Secondary | ICD-10-CM | POA: Insufficient documentation

## 2023-05-12 DIAGNOSIS — E119 Type 2 diabetes mellitus without complications: Secondary | ICD-10-CM

## 2023-05-12 MED ORDER — METFORMIN HCL 500 MG PO TABS *I*
500.0000 mg | ORAL_TABLET | Freq: Two times a day (BID) | ORAL | 3 refills | Status: DC
Start: 2023-05-12 — End: 2023-09-27

## 2023-05-12 NOTE — Telephone Encounter (Signed)
 Medication request routed to Dr. Ayesha Rumpf for processing 05/12/2023 4:01 PM    If appropriate, please fill the script for 90 days.

## 2023-05-12 NOTE — Progress Notes (Signed)
 Outpatient Visit      Patient name: Tristan Mcbride  DOB: 08/05/1945       Age: 78 y.o.  MR#: [id number]    Encounter Date: 05/12/2023    Subjective:      Chief Complaint   Patient presents with    Post-op     1 WK SP PHACO IOL OD     HPI     Post-op     Additional comments: 1 WK SP PHACO IOL OD           Comments    Tristan Mcbride is a 78 y.o. male here for 1 WK SP PHACO IOL OD.     Ocular meds:   Artifical tears PRN OU  Vigamox QID   Pred Forte QID                  Last edited by Sharin Mons, MD on 05/12/2023 10:48 AM.        has a current medication list which includes the following prescription(s): moxifloxacin, prednisolone acetate, tamsulosin, moxifloxacin, prednisolone acetate, acetaminophen, atorvastatin, lisinopril, tadalafil, metformin, ketoconazole, senna, blood pressure monitor, automatic with arm cuff, metamucil smooth texture, and [DISCONTINUED] insulin glargine.     is allergic to tetanus toxoids.      Past Medical History:   Diagnosis Date    BPH (benign prostatic hypertrophy)     DM (diabetes mellitus) 10/13/2017    Esophageal reflux     Fibrolipoma     intramuscular adipose tissue    Hyperlipidemia     Hypertension     Hypoglycemia     Kidney cyst     Rotator cuff tendinitis     Right    Sexual dysfunction     absent ejaculation    Type 2 diabetes mellitus     Weight loss       Past Surgical History:   Procedure Laterality Date    KNEE SURGERY      left    LEG TENDON SURGERY      Right    PR XCAPSL CTRC RMVL INSJ IO LENS PROSTH W/O ECP Left 04/21/2023    Procedure: PHACO W/ IOL; CAPSULE STAIN; +/- IRIS HOOKS; +/- OMIDRIA;  Surgeon: Sharin Mons, MD;  Location: [facility];  Service: Ophthalmology    PR XCAPSL CTRC RMVL INSJ IO LENS PROSTH W/O ECP Right 05/05/2023    Procedure: PHACO W/ IOL; CAPSULE STAIN;  + OMIDRIA;  Surgeon: Sharin Mons, MD;  Location: [facility];  Service: Ophthalmology        Specialty Problems          Ophthalmology Problems    DM (diabetes mellitus)                  Objective:     Base Eye Exam       Visual Acuity (Snellen - Linear)         Right Left    Dist sc 20/40 20/40              Neuro/Psych       Oriented x3: Yes    Mood/Affect: Normal                  Slit Lamp and Fundus Exam       Slit Lamp Exam         Right Left    Lids/Lashes Normal structure & position Normal structure & position    Conjunctiva/Sclera Normal bulbar/palpebral, conjunctiva, sclera conjunctivochalasis  Cornea Normal epithelium, stroma, endothelium, tear film Normal epithelium, stroma, endothelium, tear film    Anterior Chamber Clear & deep Clear & deep    Iris Normal shape, size, morphology Normal shape, size, morphology    Lens Posterior chamber intraocular lens Posterior chamber intraocular lens                  Refraction       Manifest Refraction (Auto)         Sphere Cylinder Axis    Right       Left -1.00 +1.25 160              Manifest Refraction #2         Sphere Cylinder Axis    Right       Left -1.75 +1.75 173                            No annotated images are attached to the encounter.      Assessment/Plan:       1. S/P cataract extraction and insertion of intraocular lens, left        2. S/P cataract extraction and insertion of intraocular lens, right               SP CEPCIOL OS 02/05/23 OS 05/05/23  Doing good, happy       Plan  Pred forte and vigamox continue as indicated  Follow up with Dr Lady Saucier in 3 weeks to continue care                   Patient was seen and examined.  Findings, assessment, and plan were discussed in detail with the patient, who verbalized understanding of and agreement with plan. All questions were answered. The patient was instructed to call or come in if there are any new symptoms or existing symptoms persist or worsen. To call if questions or concerns: [phone], number provided. Follow up was arranged. Certain parts of this note may have been carried over from prior Ophthalmology notes to maintain accuracy of patient's pertinent medical history and  continuity of care. The details were verified and edited as appropriate.

## 2023-06-09 ENCOUNTER — Encounter: Payer: Self-pay | Admitting: Optometry

## 2023-06-09 ENCOUNTER — Other Ambulatory Visit: Payer: Self-pay

## 2023-06-09 ENCOUNTER — Ambulatory Visit: Payer: 59 | Attending: Optometry | Admitting: Optometry

## 2023-06-09 DIAGNOSIS — H35353 Cystoid macular degeneration, bilateral: Secondary | ICD-10-CM | POA: Insufficient documentation

## 2023-06-09 DIAGNOSIS — H43813 Vitreous degeneration, bilateral: Secondary | ICD-10-CM | POA: Insufficient documentation

## 2023-06-09 DIAGNOSIS — Z961 Presence of intraocular lens: Secondary | ICD-10-CM | POA: Insufficient documentation

## 2023-06-09 DIAGNOSIS — E119 Type 2 diabetes mellitus without complications: Secondary | ICD-10-CM | POA: Insufficient documentation

## 2023-06-09 DIAGNOSIS — H40003 Preglaucoma, unspecified, bilateral: Secondary | ICD-10-CM | POA: Insufficient documentation

## 2023-06-09 DIAGNOSIS — H524 Presbyopia: Secondary | ICD-10-CM | POA: Insufficient documentation

## 2023-06-09 DIAGNOSIS — H04123 Dry eye syndrome of bilateral lacrimal glands: Secondary | ICD-10-CM | POA: Insufficient documentation

## 2023-06-09 MED ORDER — KETOROLAC TROMETHAMINE 0.5 % OP SOLN *I*
1.0000 [drp] | Freq: Four times a day (QID) | OPHTHALMIC | 4 refills | Status: DC
Start: 2023-06-09 — End: 2023-09-09

## 2023-06-09 MED ORDER — PREDNISOLONE ACETATE 1 % OP SUSP *I*
1.0000 [drp] | Freq: Four times a day (QID) | OPHTHALMIC | 2 refills | Status: DC
Start: 2023-06-09 — End: 2023-10-19

## 2023-06-09 NOTE — Progress Notes (Signed)
 Outpatient Visit      Patient name: Tristan Mcbride  DOB: 11/28/1945       Age: 78 y.o.  MR#: [id number]    Encounter Date: 06/09/2023    Subjective:      Chief Complaint   Patient presents with    Post-op     HPI    Tristan Mcbride is a 78 y.o. male is here for a 4 weeks post op. Patient is here   with an in person interpreter.     Patient reports no new flashes, floaters, swelling, pain or double vision.   States his vision seems blurry for him for NV. He says his vision seemed   better before.     Ocular meds: Artifical tears PRN OU                        Vigamox QID (states he has completed)                        Pred Forte QID (still using in both eyes)    He is going to [place] March 10th to May 20th  Last edited by Jearld Fenton, OD on 06/09/2023  1:59 PM.        has a current medication list which includes the following prescription(s): prednisolone acetate, ketorolac, metformin, tamsulosin, acetaminophen, atorvastatin, lisinopril, tadalafil, ketoconazole, senna, blood pressure monitor, automatic with arm cuff, metamucil smooth texture, and [DISCONTINUED] insulin glargine.     is allergic to tetanus toxoids.      Past Medical History:   Diagnosis Date    BPH (benign prostatic hypertrophy)     DM (diabetes mellitus) 10/13/2017    Esophageal reflux     Fibrolipoma     intramuscular adipose tissue    Hyperlipidemia     Hypertension     Hypoglycemia     Kidney cyst     Rotator cuff tendinitis     Right    Sexual dysfunction     absent ejaculation    Type 2 diabetes mellitus     Weight loss       Past Surgical History:   Procedure Laterality Date    KNEE SURGERY      left    LEG TENDON SURGERY      Right    PR XCAPSL CTRC RMVL INSJ IO LENS PROSTH W/O ECP Left 04/21/2023    Procedure: PHACO W/ IOL; CAPSULE STAIN; +/- IRIS HOOKS; +/- OMIDRIA;  Surgeon: Sharin Mons, MD;  Location: [facility];  Service: Ophthalmology    PR XCAPSL CTRC RMVL INSJ IO LENS PROSTH W/O ECP Right 05/05/2023    Procedure: PHACO W/ IOL;  CAPSULE STAIN;  + OMIDRIA;  Surgeon: Sharin Mons, MD;  Location: [facility];  Service: Ophthalmology        Specialty Problems          Ophthalmology Problems    DM (diabetes mellitus)            ROS    Positive for: Eyes  Negative for: Constitutional, Gastrointestinal, Neurological, Skin,   Genitourinary, Musculoskeletal, HENT, Endocrine, Cardiovascular,   Respiratory, Psychiatric, Allergic/Imm, Heme/Lymph  Last edited by Jearld Fenton, OD on 06/09/2023  1:55 PM.         Objective:     Base Eye Exam       Visual Acuity (Snellen - Linear)         Right Left  Dist sc 20/60 -1 20/60 +2    Dist ph sc 20/50 -1 20/50 -1    Near sc J5    Patient stated with OD hat the letters looked like they were moving.              Tonometry ([facility] tonometry (ICT), 1:03 PM)         Right Left    Pressure 11 15              Pupils         Shape React APD    Right Round Brisk None    Left Round Brisk None              Extraocular Movement         Right Left     Full Full              Neuro/Psych       Oriented x3: Yes    Mood/Affect: Normal              Dilation       Both eyes: 2.5% Phenylephrine, 1.0% Tropicamide, 0.5% Proparacaine @ 1:15 PM                  Slit Lamp and Fundus Exam       External Exam         Right Left    External Normal ocular adnexae, lacrimal gland & drainage, orbits Normal ocular adnexae, lacrimal gland & drainage, orbits              Slit Lamp Exam         Right Left    Lids/Lashes 1+ Meibomian gland dysfunction 1+ Meibomian gland dysfunction    Conjunctiva/Sclera Normal bulbar/palpebral, conjunctiva, sclera conjunctivochalasis    Cornea Reduced tear film break up time, TCCI Reduced tear film break up time, Punctate epithelial erosions inferiorly trace, with trace PEE sup temp at incision sight    Anterior Chamber Trace Cell, 1+ pigmented cells Trace Cell, trace pigmented cells    Iris Normal shape, size, morphology Normal shape, size, morphology    Lens Posterior chamber intraocular lens  Posterior chamber intraocular lens    Anterior Vitreous Posterior vitreous detachment Posterior vitreous detachment              Fundus Exam         Right Left    Disc Normal size, appearance, nerve fiber layer Normal size, appearance, nerve fiber layer, triangular shaped cup    C/D Ratio 0.7 0.7    Macula trace ERM central/slight nasal CME trace sup ERM, CME    Vessels Normal (-) DR OU Normal    Periphery Normal, few peripheral floaters Normal, peripheral floaters                  Refraction       Manifest Refraction (Auto)         Sphere Cylinder Axis Dist [place]    Right +0.50 -0.75 093     Left -0.50 -0.75 066               Manifest Refraction #2         Sphere Cylinder Axis Dist VA    Right +0.50 -1.00 093 20/40-2    Left [place] -0.50 066 20/50              Manifest Refraction Comments    Patient stated letters looked like they disappeared  with OS.    States one letter out of the line looked as if there was a line through it per patient.                          Assessment/Plan:      1. CME (cystoid macular edema), bilateral        2. Vitreous degeneration of both eyes  OCT, mac-OU      3. Presence of intraocular lens        4. Dry eye syndrome of both eyes        5. Presbyopia        6. Glaucoma suspect of both eyes        7. Type 2 diabetes mellitus without retinopathy             PLAN:    1-5. Patient here for 1 month s/p cataract extraction/intraocular lens. IOP is normal, however  vision is worse consistent with CME OU. I personally ordered and reviewed the OCT today which confirmed CME OU and recommend holding off on glasses due to the fluid in the back of the eye. Dilated examination post-cataract extraction shows no retinal detachment. He has stable floaters and some dryness is noted as well. PCIOL is clear and well centered. There are trace cells/inflammation in both eyes. Educated on the following treatment plan:  +2.25 over the counter reading glasses recommended in the meantime  -wear sunglasses on  bright sunny days  -Continue prednisolone acetate in both eye four times and begin ketorolac in both eye four times per day  -separate all drops by 5-10 minutes  -If you experience flashing lights (like lightning bolts that last for only a second), new/worsening floating spots (like cobwebs that move/float as you move your eyes) a curtain coming down on your vision, or complete loss of vision, these could be signs/symptoms of a retinal detachment therefore it is important to call and schedule an appointment ASAP  -Patient educated on warm compresses with a warm washcloth or bruder mask on both eyelids 1x/day for 10 minutes  -Call or return to clinic as needed if notices changes in vision, loss of vision, new ocular symptoms or worsening of symptoms.  Follow up in 1 month (patient has to push out further due to travel out of the country) for dilated examination and OCT MAC- CALL ASAP Sooner with changes/worsening vision    6. Not addressed today: Glaucoma suspect vs. Physiologic cupping OU, ?progression comparing 2008 photos and 2019 photos, although rim tissue appears healthy today and is stable to 2022 dilated exam therefore is likely not progressing at this time.   - IOP:  Normal IOP today  Last OCT    OD: normal average rnfl thickness at 84 um, no signs of damage, green all quadrants, stable to previous scan (slightly worse but more  consistent with cataracts)  OS: normal average rnfl thickness at 80 um, no signs of damage, green all quadrants, stable to previous scan  - last HVF from 2012 appeared normal  -pachymetry is normal  - Monitor annually with dilated examination/OCT RNFL OU/IOP check     7. The patient is without signs of diabetic retinopathy or diabetic macular edema. The patient was counseled to report any visual changes, to regularly check serum glucose levels as directed, to optimize diet, to take prescribed medications, to keep scheduled appointments, and to optimize blood glucose control with  HgA1C less than 7 or as  primary physician or endocrinologist has specified.  Communication will be forwarded to the responsible physician managing the patient's diabetes.  Follow up has been arranged for 1 year.     RTC 1 month for follow up with dilated examination/OCT MAC OU or sooner as needed

## 2023-06-09 NOTE — Patient Instructions (Addendum)
+  2.25 over the counter reading glasses recommended in the meantime  -wear sunglasses on bright sunny days  -Continue prednisolone acetate in both eye four times and begin ketorolac in both eye four times per day  -separate all drops by 5-10 minutes  -If you experience flashing lights (like lightning bolts that last for only a second), new/worsening floating spots (like cobwebs that move/float as you move your eyes) a curtain coming down on your vision, or complete loss of vision, these could be signs/symptoms of a retinal detachment therefore it is important to call and schedule an appointment ASAP  -Patient educated on warm compresses with a warm washcloth or bruder mask on both eyelids 1x/day for 10 minutes  -Call or return to clinic as needed if notices changes in vision, loss of vision, new ocular symptoms or worsening of symptoms.

## 2023-06-11 ENCOUNTER — Other Ambulatory Visit: Payer: Self-pay | Admitting: Internal Medicine

## 2023-06-11 DIAGNOSIS — I10 Essential (primary) hypertension: Secondary | ICD-10-CM

## 2023-06-11 NOTE — Telephone Encounter (Signed)
 Refill request routed to Dr. Jonny Ruiz   06/11/2023 3:23 PM

## 2023-06-12 MED ORDER — LISINOPRIL 20 MG PO TABS *I*
20.0000 mg | ORAL_TABLET | Freq: Every day | ORAL | 0 refills | Status: DC
Start: 2023-06-12 — End: 2023-09-10

## 2023-07-07 ENCOUNTER — Encounter: Payer: Self-pay | Admitting: Optometry

## 2023-07-12 ENCOUNTER — Ambulatory Visit: Payer: 59

## 2023-07-14 ENCOUNTER — Other Ambulatory Visit: Payer: 59 | Admitting: Radiology

## 2023-07-26 ENCOUNTER — Other Ambulatory Visit: Payer: Self-pay | Admitting: Internal Medicine

## 2023-07-26 DIAGNOSIS — N400 Enlarged prostate without lower urinary tract symptoms: Secondary | ICD-10-CM

## 2023-07-26 MED ORDER — TAMSULOSIN HCL 0.4 MG PO CAPS *I*
0.4000 mg | ORAL_CAPSULE | Freq: Every evening | ORAL | 0 refills | Status: DC
Start: 2023-07-26 — End: 2023-09-27

## 2023-07-26 NOTE — Telephone Encounter (Signed)
 Dr. Autry Legions unavailable. Refill request routed to Dr. Harles Lied 07/26/2023 11:07 AM     Refill request received from patient's pharmacy via fax.

## 2023-08-09 IMAGING — CT TC - Pelve / Bacia / Abdomen Inferior
1 series · 16 of 32 positions shown, 20 images · non-contrast
Comparison: none

[Series 201: s/contraste · axial · U · 0.80mm/px · z∈[+1121,+1430]mm · 16 of 344 slices shown, 20 images]
[im 23/344  soft-tissue]
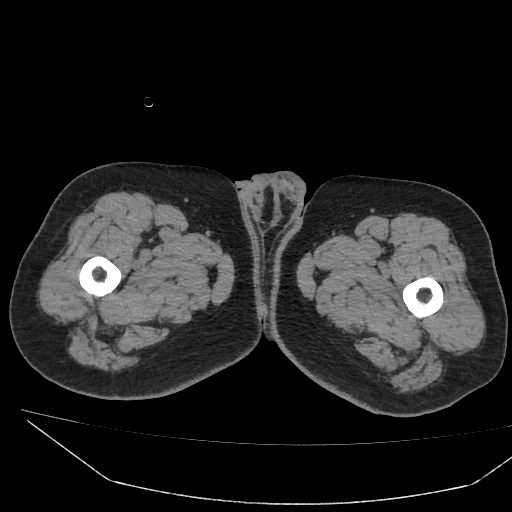
[im 23/344  bone]
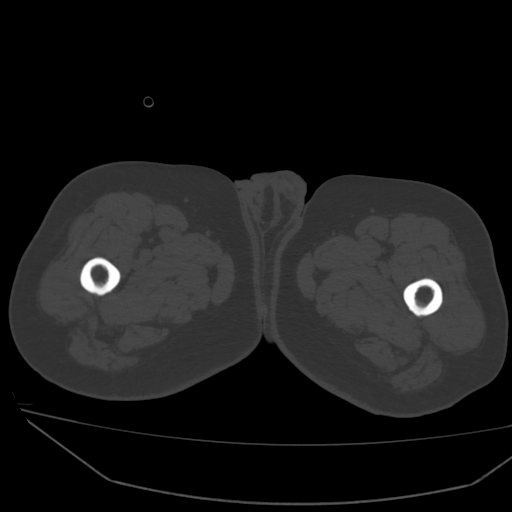
[im 45/344  soft-tissue]
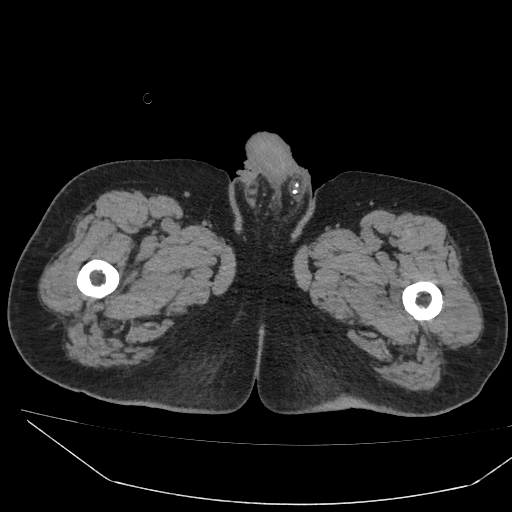
[im 67/344  soft-tissue]
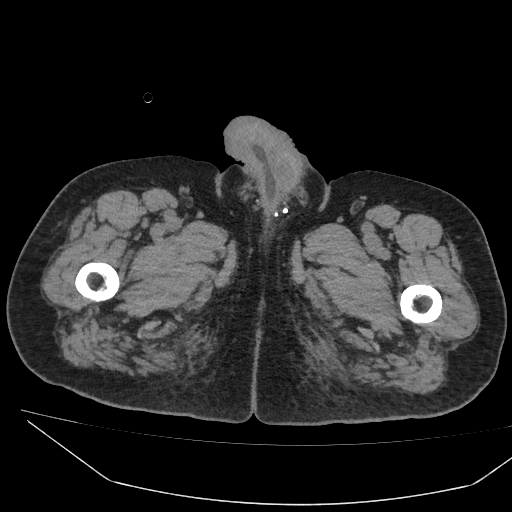
[im 89/344  soft-tissue]
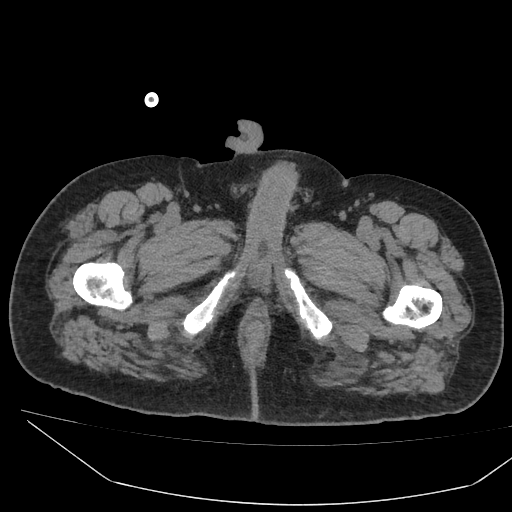
[im 111/344  soft-tissue]
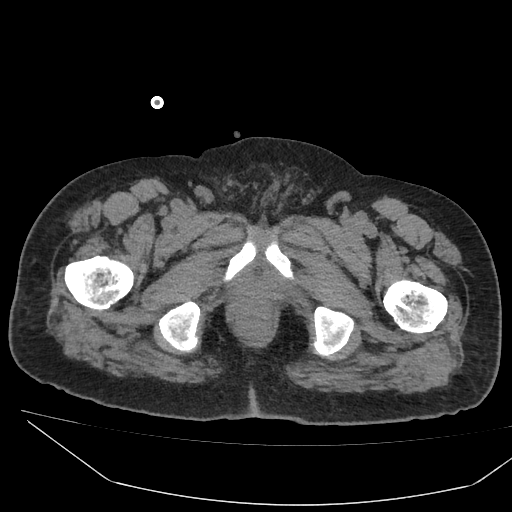
[im 133/344  soft-tissue]
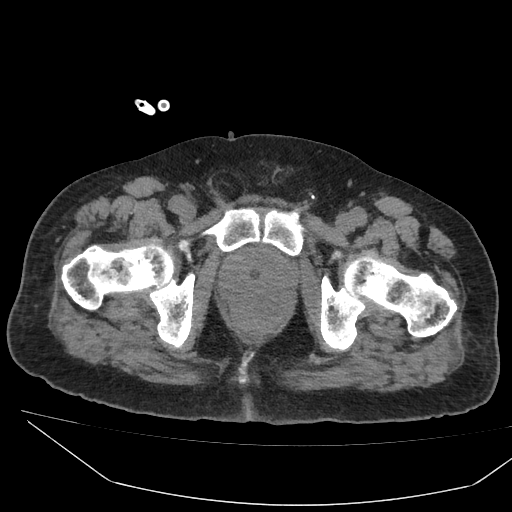
[im 155/344  soft-tissue]
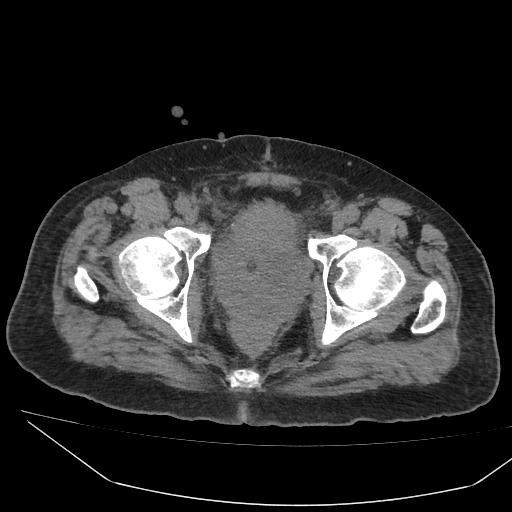
[im 189/344  soft-tissue]
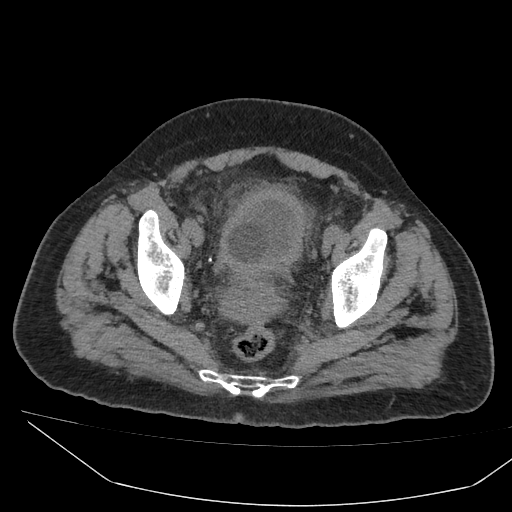
[im 211/344  soft-tissue]
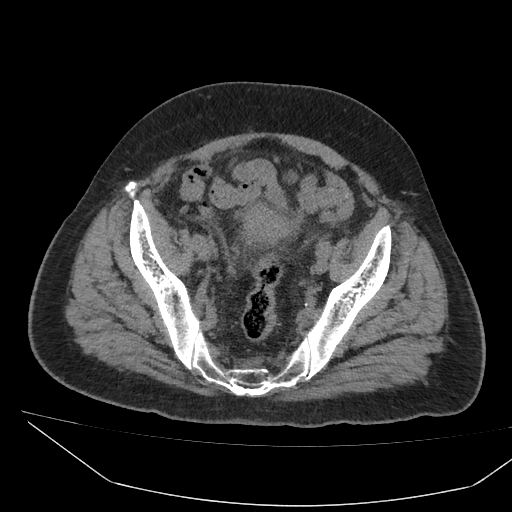
[im 211/344  bone]
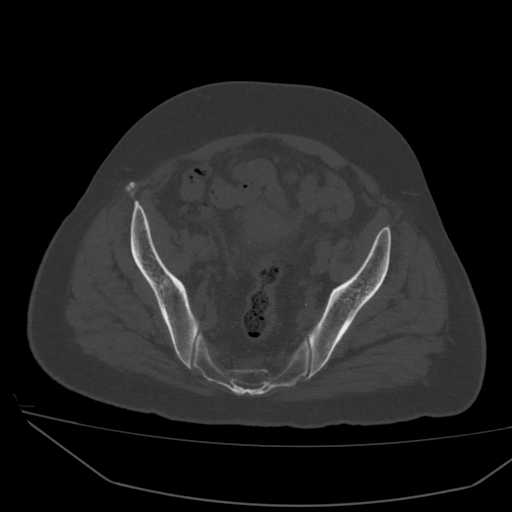
[im 233/344  soft-tissue]
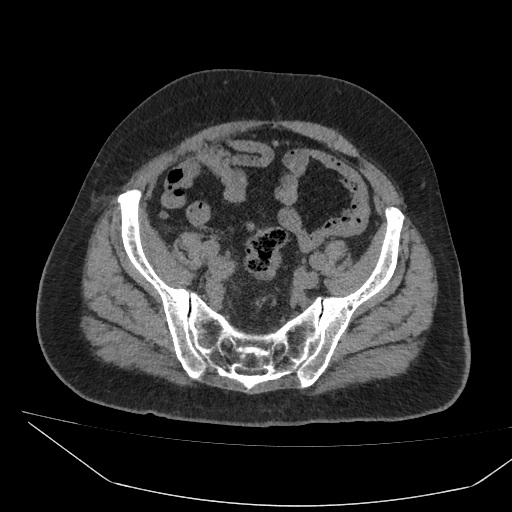
[im 255/344  soft-tissue]
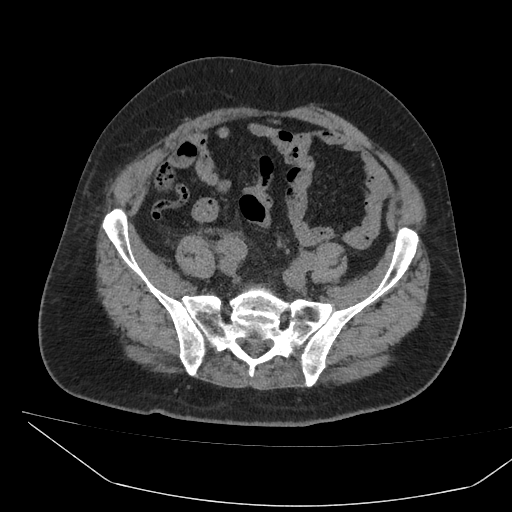
[im 277/344  soft-tissue]
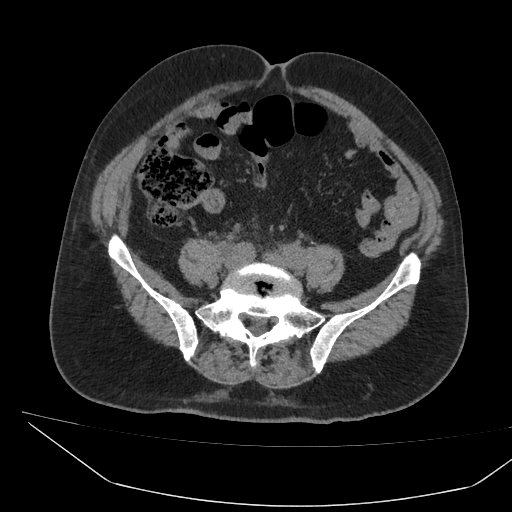
[im 299/344  soft-tissue]
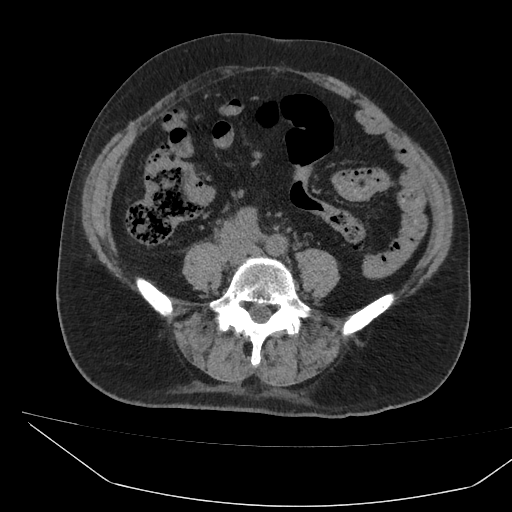
[im 299/344  lung]
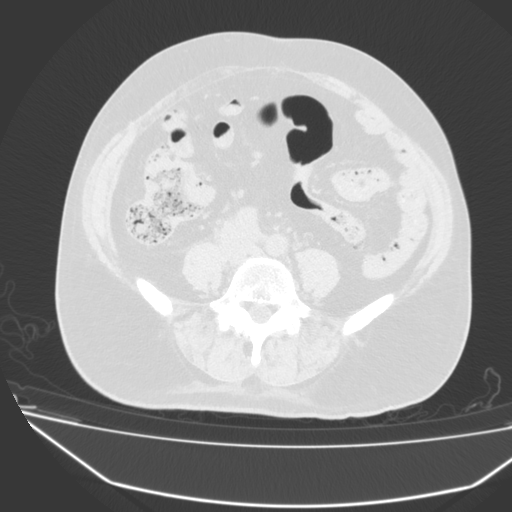
[im 310/344  lung]
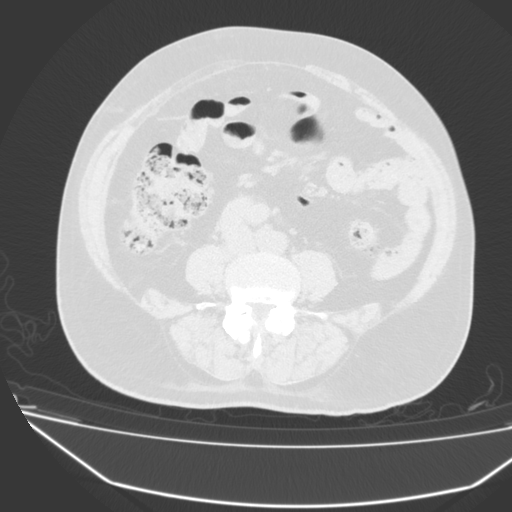
[im 321/344  soft-tissue]
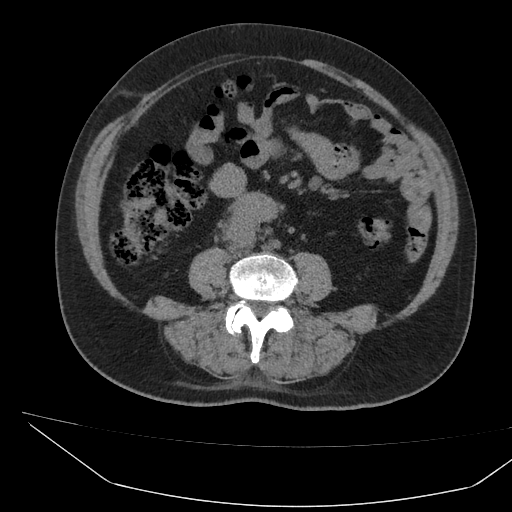
[im 321/344  lung]
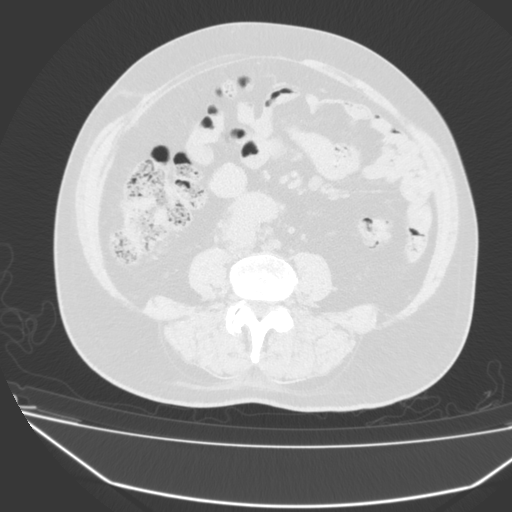
[im 332/344  lung]
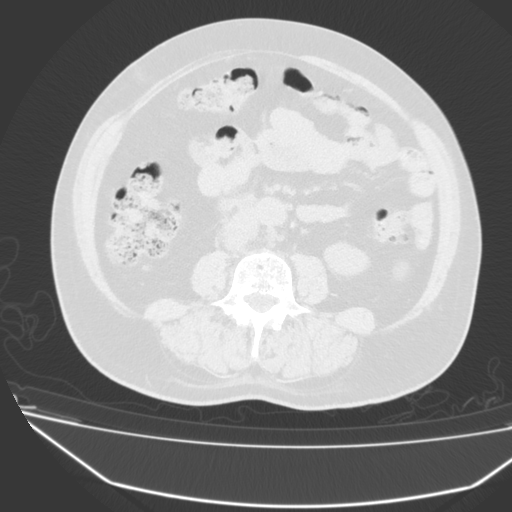

[16 of 32 positions shown; findings below may reference images not displayed]

Gênero:Masculino
Técnica:
Aquisições volumétricas com reconstruções multiplanares.
Relatório:
Bexiga urinária sondada, pouco repelta, contendo gás, apresentando espessamento parietal difuso com
TOMOGRAFIA COMPUTADORIZADA DA PELVE MASCULINA
densificação da gordura adjacente, sugerindo processo inflamatório.
Para melhor avaliação do conteúdo intravesical, correlacionar com estudo ultrassonográfico com bexiga sob
boa repleção.
Alças intestinais visualizadas de distribuição habitual.
Ausência de linfonodomegalias ou líquido livre na cavidade pélvica.
Impressão:
Bexiga urinária sondada, pouco repelta, contendo gás, apresentando espessamento parietal difuso com
densificação da gordura adjacente, sugerindo processo inflamatório.
Para melhor avaliação do conteúdo intravesical, correlacionar com estudo ultrassonográfico com bexiga sob
boa repleção.
[facility] - [phone], [place] [phone], [place]

## 2023-08-10 IMAGING — CT TC - Articulacoes de Membro Inferior
3 of 4 series · 7 of 14 positions shown, 8 images · non-contrast
Comparison: none

[Series 201: p. moles · axial · B · 0.98mm/px · z∈[+1065,+1439]mm · 3 of 750 slices shown, 4 images]
[im 188/750  soft-tissue]
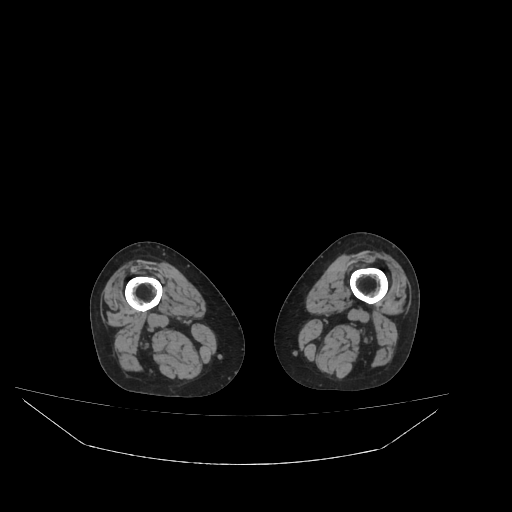
[im 188/750  bone]
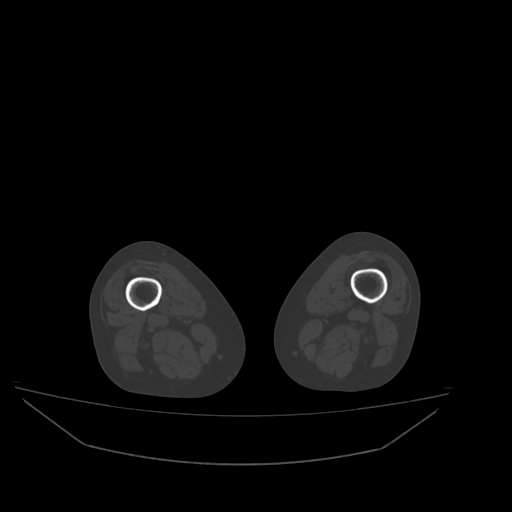
[im 375/750  bone]
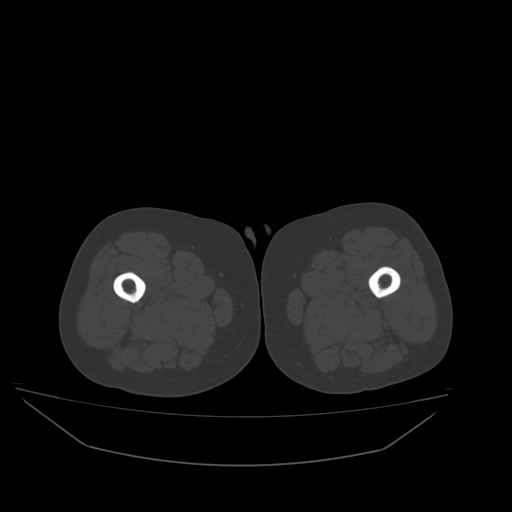
[im 562/750  bone]
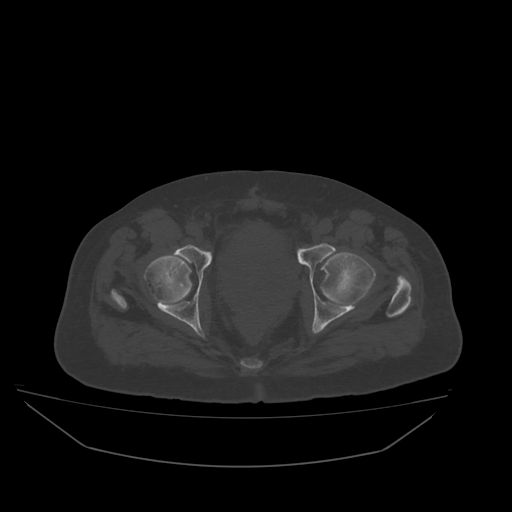

[Series 301: p. moles c/c · axial · B · 0.97mm/px · z∈[+1128,+1380]mm · 2 of 756 slices shown (1 of 2)]
[im 252/756  bone]
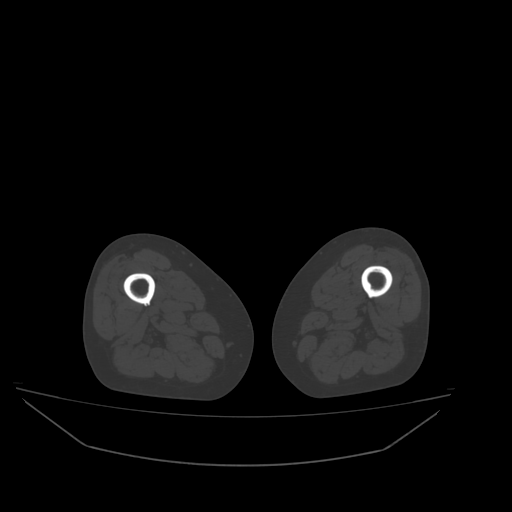
[im 504/756  bone]
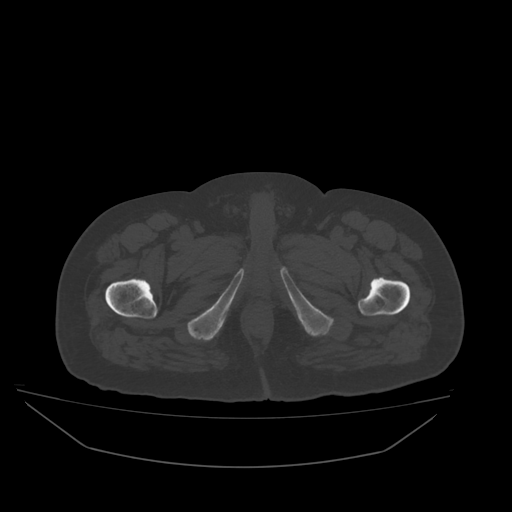

[Series 401: p. moles c/c · axial · B · 0.97mm/px · z∈[+1128,+1380]mm · 2 of 756 slices shown (2 of 2)]
[im 252/756  bone]
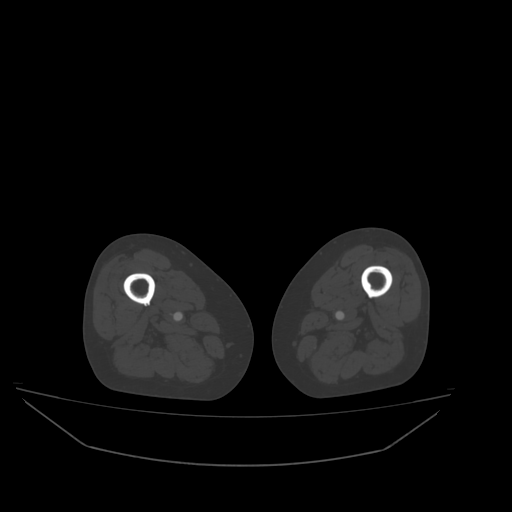
[im 504/756  bone]
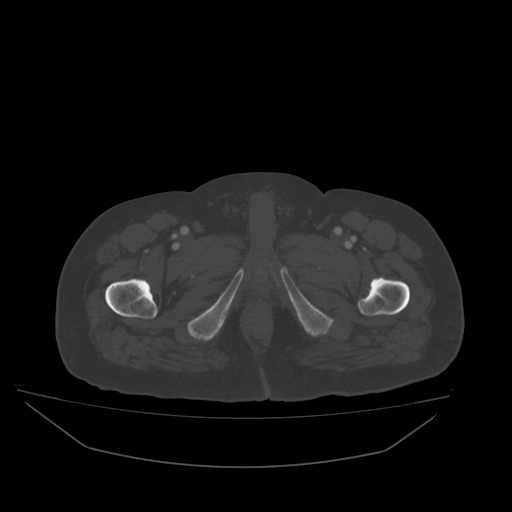

[7 of 14 positions shown; findings below may reference images not displayed]

Gênero:Masculino
Técnica: Realizadas aquisições volumétricas seguidas de reconstruções multiplanares antes e após a
administração de meio de contraste iodado.
Relatório:
Ausência de fraturas femorais.
Diminuta lesão óssea, bem delimitada, com margens estreitas, intramedular, com calcificação de aspecto
puntiforme, medindo cerca de 2,4 x 1,7 x 1,5 cm, medular central, localizada na metáfise distal femoral, sem
TOMOGRAFIA COMPUTADORIZADA DA COXA ESQUERDA
recorte endosteal, sugestiva de representar lesão de baixa agressividade de matriz condral, devendo-se
considerar a possibilidade de encondroma dentre os diagnósticos principais.
Ausência de demais lesões osteolíticas ou blásticas.
Alterações degenerativas da articulação coxofemoral evidenciadas por osteofitose marginal e redução do
espaço articular.
Ausência de coleções evidentes ao método.
Tendões, músculos e demais estruturas de partes moles sem anormalidades, salientando a sensibilidade
limitada do método para avaliação destas estruturas.
Impressão:
Lesão óssea em metáfise distal femoral sugestiva de lesão de baixa agressividade de matriz condral,
devendo-se considerar a possibilidade de encondroma dentre os diagnósticos principais.
Achado adicional: próstata heterogênea de dimensões muito aumentadas associada à espessamento parietal
e realce mucoso vesical.
[facility] [address] [place] [id number], [place]
[employer]

## 2023-08-19 IMAGING — CT TC - Pelve / Bacia / Abdomen Inferior
3 of 7 series · 13 of 32 positions shown, 18 images · non-contrast
Comparison: none

------------- REPORT [id number] -------------
Gender: Male
TECHNIQUE: Volumetric acquisitions with multiplanar reconstructions. Intravenous contrast administered.
Report:
Urinary bladder with little capacity, significant parietal thickening without clear cleavage planes
with the prostate, which shows a significant increase in size, heterogeneity of the parenchyma,
irregular contours, and uptake of the intravenous iodinated contrast.
COMPUTED TOMOGRAPHY OF THE MALE PELVIS
Seminal vesicles with preserved morphology and attenuation.
Retroperitoneal periaortocaval and pelvic lymphadenopathies up to 3.6 cm.
Liver with normal dimensions, morphology, and density. Multiple portosystemic collaterals in the upper
abdomen.
COMPUTED TOMOGRAPHY OF THE UPPER ABDOMEN
Gallbladder not identified due to probable surgical resection with aerobilia.
Pancreas with preserved morphology and attenuation coefficient. There is no dilation of the main pancreatic duct.
Spleen with normal topography, dimensions, and density.
Adrenals with preserved appearance.
Topical kidneys, with preserved dimensions and contours. There are no calculi or hydronephrosis. Simple renal cysts
up to 5.0 cm.
Aorta and inferior vena cava with normal course and caliber.
Absence of free fluid in the cavity.
Retroperitoneal periaortocaval lymphadenomegaly up to 3.6 cm.
Pulmonary parenchyma with preserved attenuation.
Rare non-calcified micronodules in the lungs, nonspecific.
Absence of pleural effusion.
CHEST COMPUTED TOMOGRAPHY
Trachea and main bronchi patent, with normal caliber.
Mediastinal vascular structures with preserved caliber. Discrete aortic atheromatosis.
Preserved cardiac area.
Absence of mediastinal, hilar, or axillary lymphadenomegaly.
Bony structures without relevant changes.

[Series 201: tx+abd.pelve s/c · axial · U · 0.88mm/px · z∈[+1390,+1622]mm · 3 of 695 slices shown]
[im 116/695  soft-tissue]
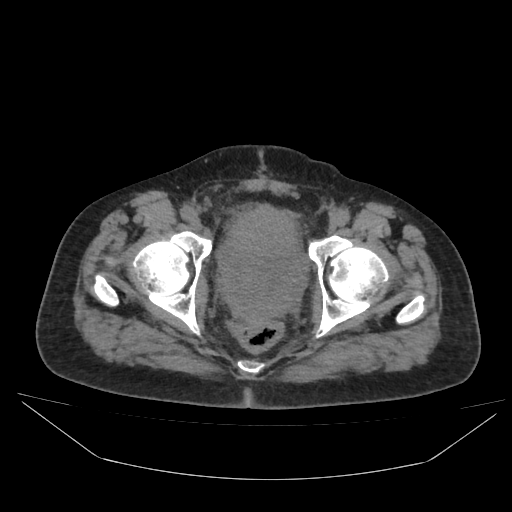
[im 232/695  soft-tissue]
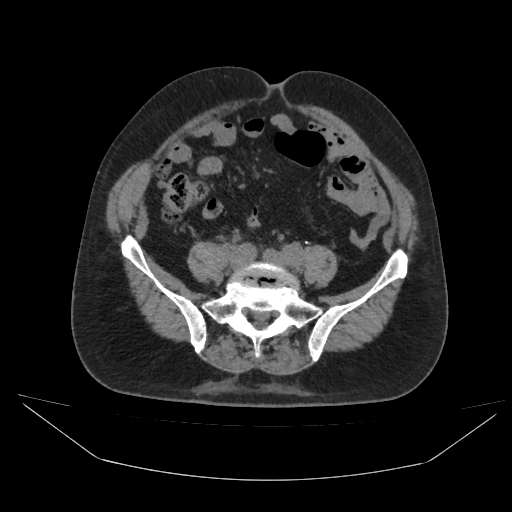
[im 348/695  soft-tissue]
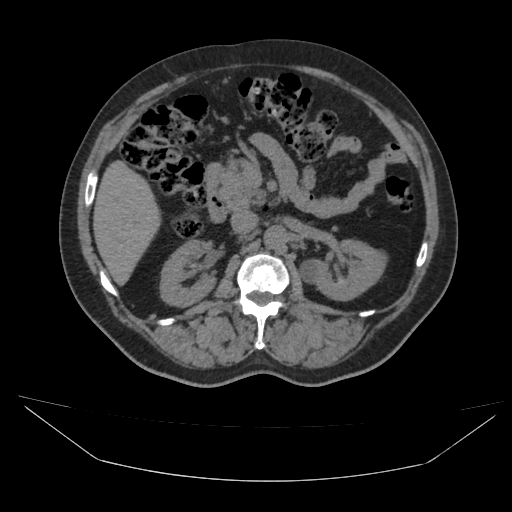

[Series 501: tx+abd sup.arterial · axial · arterial · U · 0.88mm/px · z∈[+1373,+1869]mm · 6 of 696 slices shown]
[im 100/696  soft-tissue]
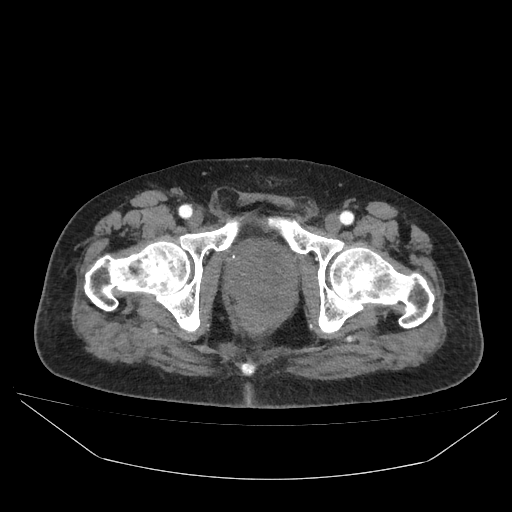
[im 199/696  soft-tissue]
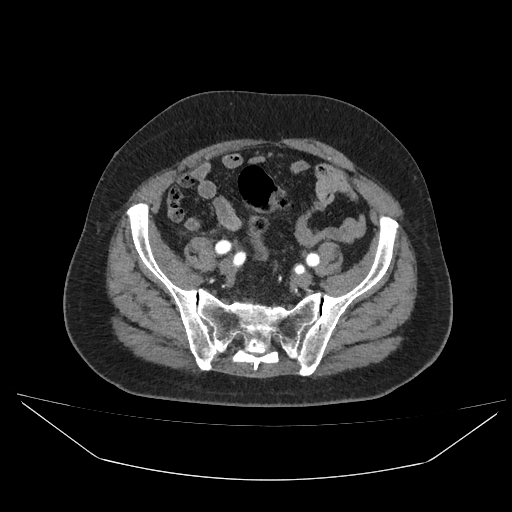
[im 298/696  soft-tissue]
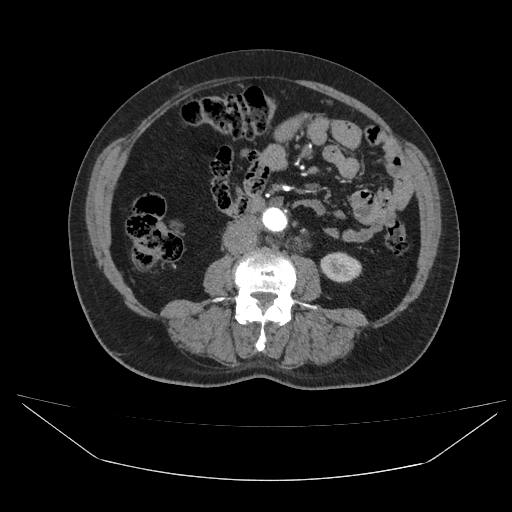
[im 398/696  soft-tissue]
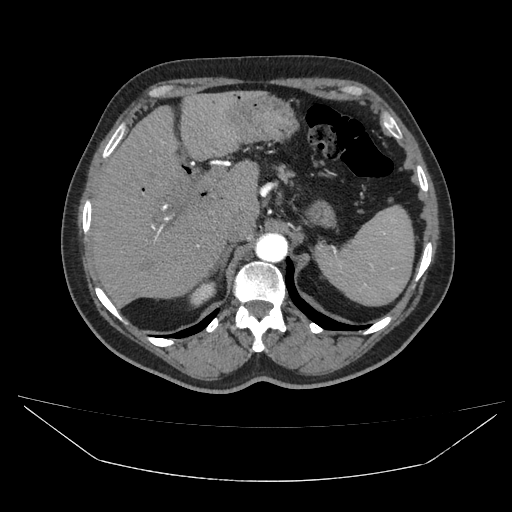
[im 497/696  soft-tissue]
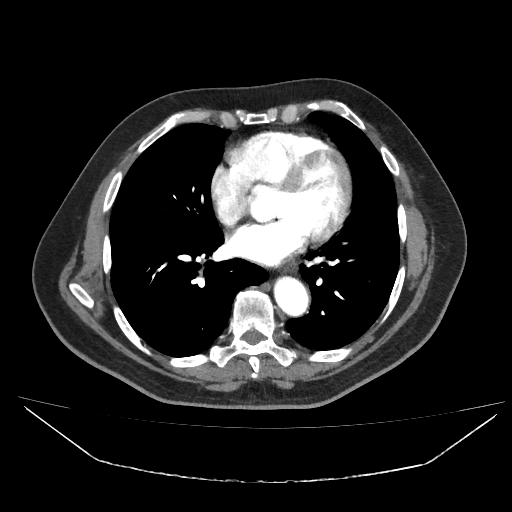
[im 596/696  soft-tissue]
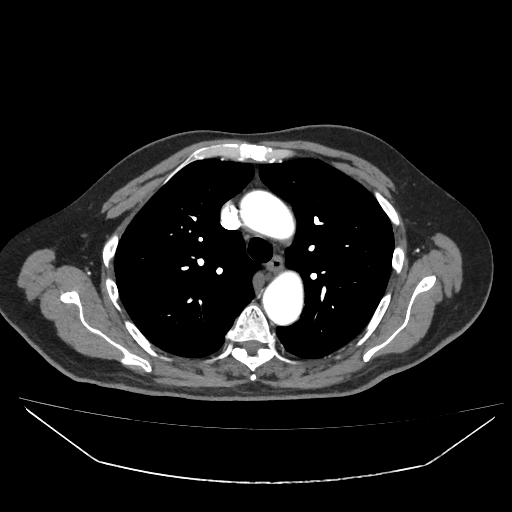

[Series 601: portal · axial · portal-venous · U · 0.88mm/px · z∈[+1378,+1690]mm · 4 of 522 slices shown, 9 images]
[im 105/522  soft-tissue]
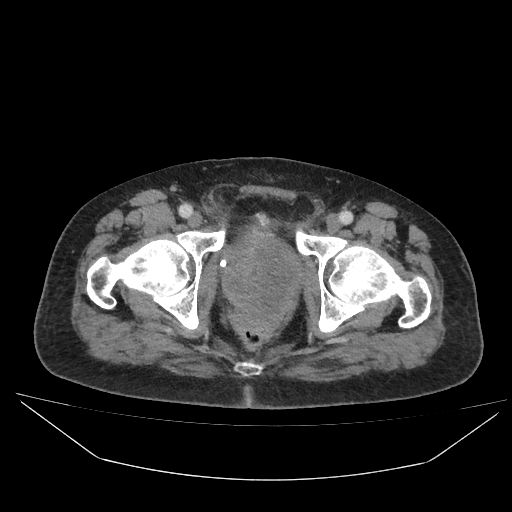
[im 105/522  lung]
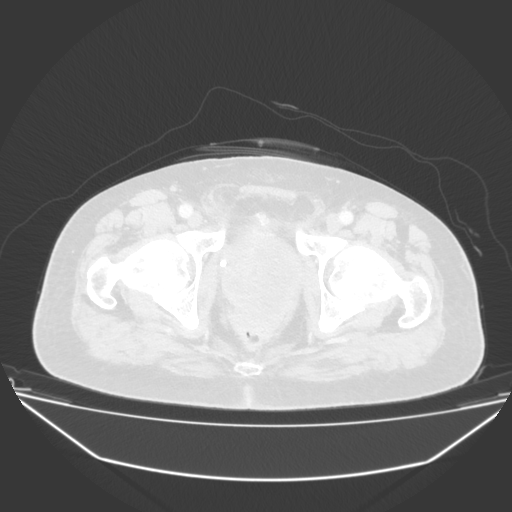
[im 105/522  bone]
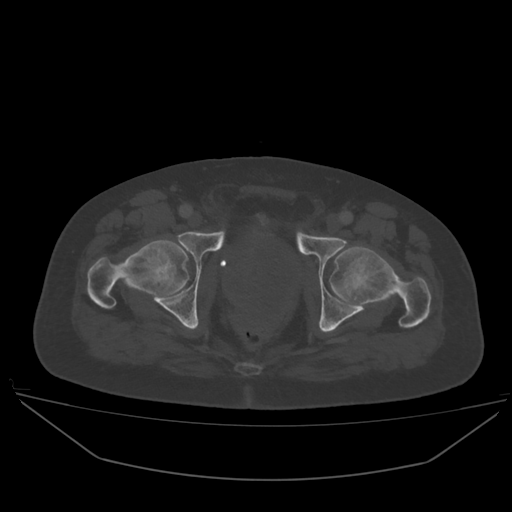
[im 209/522  soft-tissue]
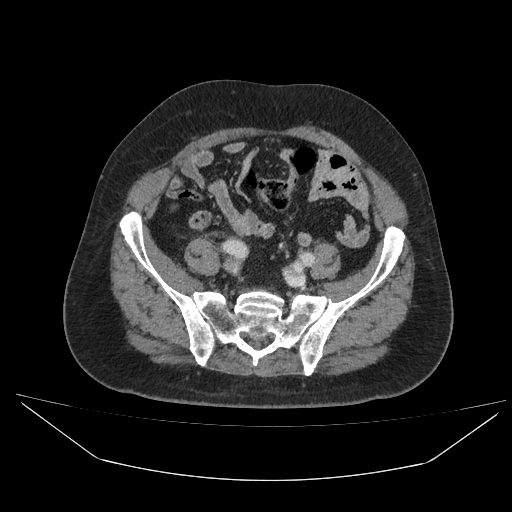
[im 209/522  lung]
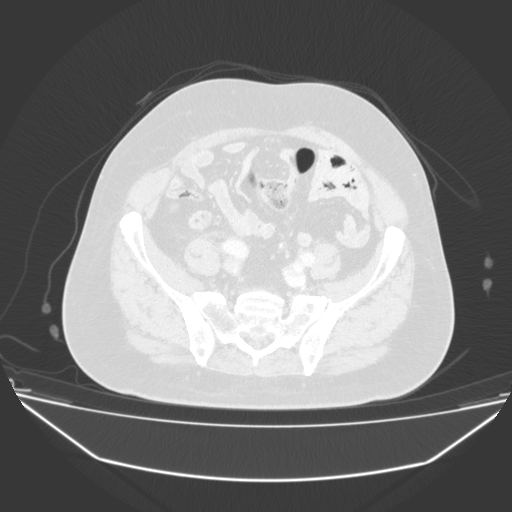
[im 313/522  soft-tissue]
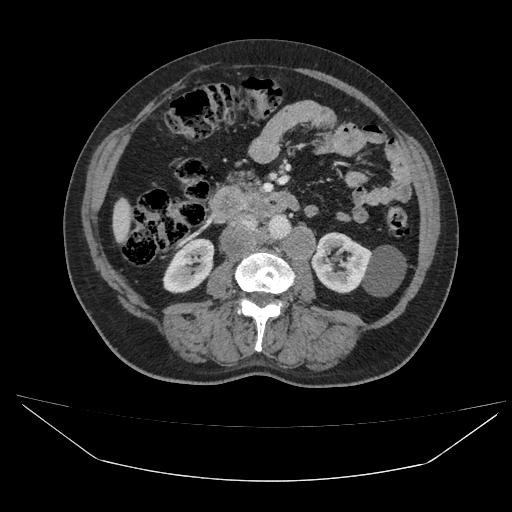
[im 313/522  lung]
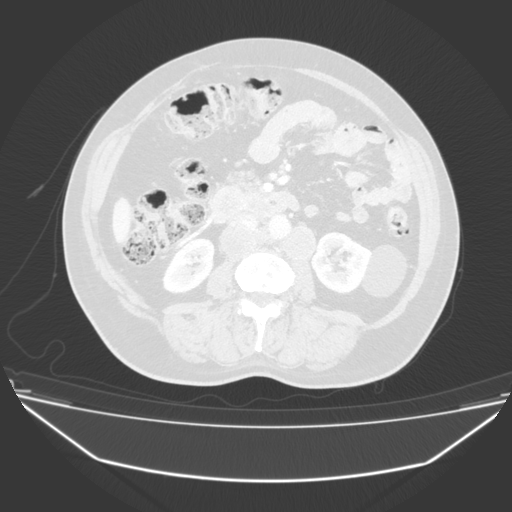
[im 417/522  soft-tissue]
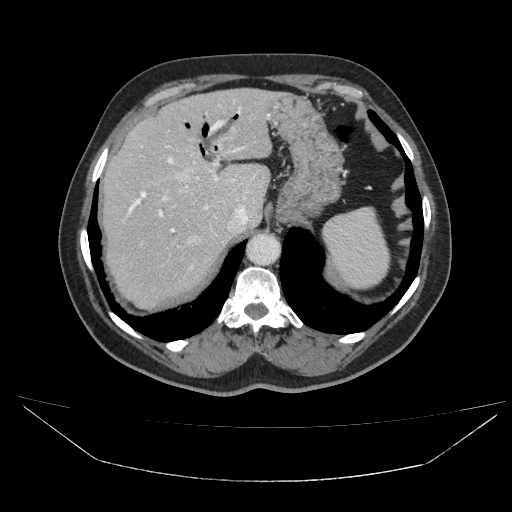
[im 417/522  lung]
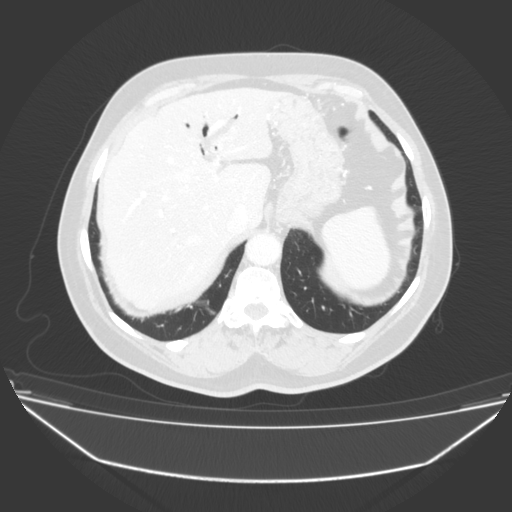

[13 of 32 positions shown; findings below may reference images not displayed]

IMPRESSION: Expansive prostatic process with probable infiltration/invasion of the urinary bladder and associated lymphadenopathies.
[facility] - [REDACTED[address] [place] [id number], [place]


------------- REPORT [id number] -------------
Gender: Male
IMPRESSION: Multiple portosystemic collaterals in the upper abdomen.
Gallbladder not identified due to probable surgical resection with aerobilia.
Renal cysts (Bosniak I).
Retroperitoneal periaortocaval lymphadenomegaly
[facility] - [REDACTED[address] [place] [id number], [place]


------------- REPORT [id number] -------------
Gender: Male
IMPRESSION: Rare non-calcified micronodules in the lungs, nonspecific.
Discrete aortic atheromatosis.
[facility] - [REDACTED] [address] [place] [id number], [place]

## 2023-08-25 ENCOUNTER — Other Ambulatory Visit: Payer: Self-pay

## 2023-09-08 ENCOUNTER — Encounter: Payer: Self-pay | Admitting: Optometry

## 2023-09-08 ENCOUNTER — Other Ambulatory Visit: Payer: Self-pay

## 2023-09-08 ENCOUNTER — Ambulatory Visit: Payer: 59 | Attending: Optometry | Admitting: Optometry

## 2023-09-08 DIAGNOSIS — Z961 Presence of intraocular lens: Secondary | ICD-10-CM | POA: Insufficient documentation

## 2023-09-08 DIAGNOSIS — H524 Presbyopia: Secondary | ICD-10-CM | POA: Insufficient documentation

## 2023-09-08 DIAGNOSIS — H43813 Vitreous degeneration, bilateral: Secondary | ICD-10-CM | POA: Insufficient documentation

## 2023-09-08 DIAGNOSIS — H04123 Dry eye syndrome of bilateral lacrimal glands: Secondary | ICD-10-CM | POA: Insufficient documentation

## 2023-09-08 DIAGNOSIS — H40003 Preglaucoma, unspecified, bilateral: Secondary | ICD-10-CM | POA: Insufficient documentation

## 2023-09-08 DIAGNOSIS — E119 Type 2 diabetes mellitus without complications: Secondary | ICD-10-CM | POA: Insufficient documentation

## 2023-09-08 DIAGNOSIS — H35353 Cystoid macular degeneration, bilateral: Secondary | ICD-10-CM | POA: Insufficient documentation

## 2023-09-08 NOTE — Progress Notes (Signed)
 Outpatient Visit      Patient name: Tristan Mcbride  DOB: 1945/05/17       Age: 78 y.o.  MR#: [id number]    Encounter Date: 09/08/2023    Subjective:      Chief Complaint   Patient presents with    Follow-up     CME (cystoid macular edema), bilateral     HPI     Follow-up     Additional comments: CME (cystoid macular edema), bilateral           Comments    Tristan Mcbride is a 78 y.o. male here for 4 month FUV for DFE and OCT MAC. But   he was supposed to return in 1 month.    In-person intreper has present for the exam    Patient denies change in vision. He reports having a foreign body   sensation in his left eye. He denies eye pain, pressure or discomfort. He   denies floaters, blind spots, and flashes of light.    Ocular medication:  prednisolone  acetate 4x both eyes  ketorolac  4x both eyes              Last edited by Tereso Fenton, OD on 09/08/2023  1:13 PM.        has a current medication list which includes the following prescription(s): tamsulosin , lisinopril , prednisolone  acetate, ketorolac , metformin , acetaminophen , atorvastatin , tadalafil , ketoconazole , senna, blood pressure monitor, automatic with arm cuff, metamucil smooth texture, and [DISCONTINUED] insulin  glargine.     is allergic to tetanus toxoids.      Past Medical History:   Diagnosis Date    BPH (benign prostatic hypertrophy)     DM (diabetes mellitus) 10/13/2017    Esophageal reflux     Fibrolipoma     intramuscular adipose tissue    Hyperlipidemia     Hypertension     Hypoglycemia     Kidney cyst     Rotator cuff tendinitis     Right    Sexual dysfunction     absent ejaculation    Type 2 diabetes mellitus     Weight loss       Past Surgical History:   Procedure Laterality Date    KNEE SURGERY      left    LEG TENDON SURGERY      Right    PR XCAPSL CTRC RMVL INSJ IO LENS PROSTH W/O ECP Left 04/21/2023    Procedure: PHACO W/ IOL; CAPSULE STAIN; +/- IRIS HOOKS; +/- OMIDRIA;  Surgeon: Michael Ades, MD;  Location: [facility];  Service: Ophthalmology     PR XCAPSL CTRC RMVL INSJ IO LENS PROSTH W/O ECP Right 05/05/2023    Procedure: PHACO W/ IOL; CAPSULE STAIN;  + OMIDRIA;  Surgeon: Michael Ades, MD;  Location: [facility];  Service: Ophthalmology        Specialty Problems          Ophthalmology Problems    DM (diabetes mellitus)            ROS    Positive for: Eyes  Negative for: Constitutional, Gastrointestinal, Neurological, Skin,   Genitourinary, Musculoskeletal, HENT, Endocrine, Cardiovascular,   Respiratory, Psychiatric, Allergic/Imm, Heme/Lymph  Last edited by Conchetta Deeds, COA on 09/08/2023 10:54 AM.         Objective:     Base Eye Exam       Visual Acuity (Snellen - Linear)         Right Left    Dist sc  20/30 20/40 -1    Near sc J3 J2              Tonometry (Tonopen, 12:45 PM)         Right Left    Pressure 17 18              Pupils         Shape React APD    Right Round Brisk None    Left Round Brisk None              Extraocular Movement         Right Left     Full Full              Neuro/Psych       Oriented x3: Yes    Mood/Affect: Normal              Dilation       Both eyes: 2.5% Phenylephrine , 1.0% Tropicamide , 0.5% Proparacaine @ 12:45 PM                  Slit Lamp and Fundus Exam       External Exam         Right Left    External Normal ocular adnexae, lacrimal gland & drainage, orbits Normal ocular adnexae, lacrimal gland & drainage, orbits              Slit Lamp Exam         Right Left    Lids/Lashes 1+ Meibomian gland dysfunction 1+ Meibomian gland dysfunction    Conjunctiva/Sclera Normal bulbar/palpebral, conjunctiva, sclera conjunctivochalasis    Cornea Reduced tear film break up time, TCCI Reduced tear film break up time, TCCI    Anterior Chamber Clear & deep, 1 cell total Clear & deep    Iris Normal shape, size, morphology Normal shape, size, morphology    Lens Posterior chamber intraocular lens Posterior chamber intraocular lens    Anterior Vitreous Posterior vitreous detachment Posterior vitreous detachment              Fundus Exam          Right Left    Disc Normal size, appearance, nerve fiber layer Normal size, appearance, nerve fiber layer, triangular shaped cup    C/D Ratio 0.7 0.7    Macula trace ERM central, resolved CME trace sup ERM, resolved CME    Vessels Normal (-) DR OU Normal    Periphery Normal, few peripheral floaters Normal, peripheral floaters                  Refraction       Cycloplegic Refraction         Sphere Cylinder Axis Dist VA Add    Right -0.50 +1.00 150 20/25 +2.50    Left -0.75 +0.50 152 20/30 +2.50              Final Rx         Sphere Cylinder Axis Dist VA Add    Right -0.50 +1.00 150 20/25 +2.50    Left -0.75 +0.50 152 20/30 +2.50      Type: PAL    Expiration Date: 09/07/2025                  Final Rx         Sphere Cylinder Axis Dist VA Add    Right -0.50 +1.00 150 20/25 +2.50    Left -0.75 +0.50 152 20/30 +  2.50      Type: PAL    Expiration Date: 09/07/2025                Assessment/Plan:      1. CME (cystoid macular edema), bilateral Inactive OCT, mac-OU    OCT, mac-OU      2. Vitreous degeneration of both eyes        3. Presence of intraocular lens        4. Dry eye syndrome of both eyes        5. Presbyopia        6. Glaucoma suspect of both eyes        7. Type 2 diabetes mellitus without retinopathy             PLAN:    1-5. Patient here for follow up with a history of CME, which is now resolved. . IOP is normal, however  vision is worse consistent with CME OU. I personally ordered and reviewed the OCT today which showed:  Trace ERM without any CME anymore, retina is flat. Vision is improved.  Since CME is resolved,  -+2.50 over the counter reading glasses recommended in the meantime  -Given new Rx for PAL/bifocal  Educated on the following treatment plan:  -decrease prednisolone  acetate and ketorolac  in both eye three times per day for 1 month, then 2x/day for 1 month, then 1x/day for 1 month then discontinue  -separate all drops by 5-10 minutes  Dilated examination shows no retinal detachment. He has stable  floaters and some dryness is noted as well. PCIOL is clear and well centered. There is no inflammation in both eyes.   -If you experience flashing lights (like lightning bolts that last for only a second), new/worsening floating spots (like cobwebs that move/float as you move your eyes) a curtain coming down on your vision, or complete loss of vision, these could be signs/symptoms of a retinal detachment therefore it is important to call and schedule an appointment ASAP  -Patient educated on warm compresses with a warm washcloth or bruder mask on both eyelids 1x/day for 10 minutes  -Call or return to clinic as needed if notices changes in vision, loss of vision, new ocular symptoms or worsening of symptoms.  Follow up in 3 months for dilated examination and OCT MAC OU and RNFL OU- CALL ASAP Sooner with changes/worsening vision    6. Glaucoma suspect vs. Physiologic cupping OU, ?progression comparing 2008 photos and 2019 photos, although rim tissue appears healthy today and is stable to 2022 dilated exam therefore is likely not progressing at this time.  - IOP:  Normal IOP today  Last OCT:  OD: normal average rnfl thickness at 84 um, no signs of damage, green all quadrants, stable to previous scan (slightly worse but more  consistent with cataracts)  OS: normal average rnfl thickness at 80 um, no signs of damage, green all quadrants, stable to previous scan  - last HVF from 2012 appeared normal  -pachymetry is normal  - Monitor at next  visit with repeat OCT RNFL OU/IOP check     7. The patient is without signs of diabetic retinopathy or diabetic macular edema. The patient was counseled to report any visual changes, to regularly check serum glucose levels as directed, to optimize diet, to take prescribed medications, to keep scheduled appointments, and to optimize blood glucose control with HgA1C less than 7 or as primary physician or endocrinologist has specified.  Communication will be forwarded to  the responsible  physician managing the patient's diabetes.  Follow up has been arranged for 1 year.     RTC 3 month for follow up with dilated examination/OCT MAC OU/OCT RNFL OU or sooner as needed

## 2023-09-08 NOTE — Patient Instructions (Signed)
+  2.50 over the counter reading glasses recommended in the meantime  Given new Rx for PAL/bifocal  -decrease prednisolone  acetate and ketorolac  in both eye three times per day for 1 month, then 2x/day for 1 month, then 1x/day for 1 month then discontinue  -separate all drops by 5-10 minutes    -If you experience flashing lights (like lightning bolts that last for only a second), new/worsening floating spots (like cobwebs that move/float as you move your eyes) a curtain coming down on your vision, or complete loss of vision, these could be signs/symptoms of a retinal detachment therefore it is important to call and schedule an appointment ASAP  -Patient educated on warm compresses with a warm washcloth or bruder mask on both eyelids 1x/day for 10 minutes  -Call or return to clinic as needed if notices changes in vision, loss of vision, new ocular symptoms or worsening of symptoms.

## 2023-09-09 ENCOUNTER — Other Ambulatory Visit: Payer: Self-pay | Admitting: Optometry

## 2023-09-09 MED ORDER — KETOROLAC TROMETHAMINE 0.5 % OP SOLN *I*
1.0000 [drp] | Freq: Three times a day (TID) | OPHTHALMIC | 4 refills | Status: DC
Start: 2023-09-09 — End: 2023-10-19

## 2023-09-09 NOTE — Telephone Encounter (Signed)
 Patient called to get refill for medication.

## 2023-09-09 NOTE — Telephone Encounter (Signed)
 Last seen 5/28 scheduled 9/10

## 2023-09-10 ENCOUNTER — Other Ambulatory Visit: Payer: Self-pay | Admitting: Internal Medicine

## 2023-09-10 DIAGNOSIS — I10 Essential (primary) hypertension: Secondary | ICD-10-CM

## 2023-09-10 NOTE — Telephone Encounter (Signed)
 Refill request routed to Dr. Autry Legions   09/10/2023 10:30 AM     Refill request received from patient's pharmacy via fax.

## 2023-09-13 ENCOUNTER — Other Ambulatory Visit: Payer: Self-pay

## 2023-09-13 ENCOUNTER — Ambulatory Visit
Admission: RE | Admit: 2023-09-13 | Discharge: 2023-09-13 | Disposition: A | Discharge status: Home / Self Care | Source: Ambulatory Visit | Attending: Internal Medicine | Admitting: Internal Medicine

## 2023-09-13 DIAGNOSIS — R911 Solitary pulmonary nodule: Secondary | ICD-10-CM | POA: Insufficient documentation

## 2023-09-13 DIAGNOSIS — E049 Nontoxic goiter, unspecified: Secondary | ICD-10-CM | POA: Insufficient documentation

## 2023-09-13 MED ORDER — LISINOPRIL 20 MG PO TABS *I*
20.0000 mg | ORAL_TABLET | Freq: Every day | ORAL | 0 refills | Status: DC
Start: 2023-09-13 — End: 2023-09-27

## 2023-09-15 ENCOUNTER — Ambulatory Visit

## 2023-09-15 ENCOUNTER — Telehealth: Payer: Self-pay | Admitting: Internal Medicine

## 2023-09-15 NOTE — Telephone Encounter (Signed)
 Copied from CRM #[id number]. Topic: Appointments - Reschedule Appointment  >> Sep 15, 2023 12:36 PM Jerilynn Montenegro wrote:  Tristan Mcbride -spouse to [place], called same day to cancel their appointment which is scheduled at 09/15/2023 3:00 PM [name], MD.    Patient is aware of the no show policy? Yes    Patient can be reached if necessary at Telephone Information:  Mobile          [phone].    Reason for cancellation schedule conflict    Was appointment rescheduled? Yes 09/27/2023

## 2023-09-15 NOTE — Telephone Encounter (Signed)
 Noted 09/15/2023

## 2023-09-16 ENCOUNTER — Ambulatory Visit: Payer: Self-pay

## 2023-09-16 ENCOUNTER — Encounter: Payer: Self-pay | Admitting: Internal Medicine

## 2023-09-17 ENCOUNTER — Other Ambulatory Visit: Payer: Self-pay | Admitting: Internal Medicine

## 2023-09-17 DIAGNOSIS — Z794 Long term (current) use of insulin: Secondary | ICD-10-CM

## 2023-09-17 DIAGNOSIS — Z1389 Encounter for screening for other disorder: Secondary | ICD-10-CM

## 2023-09-17 NOTE — Progress Notes (Unsigned)
 Damin is due for an A1c and  Microalbumin.  he has an appointment on 09/27/2023   Please review-sign the pending ordered    Thank you

## 2023-09-20 ENCOUNTER — Telehealth: Payer: Self-pay | Admitting: Internal Medicine

## 2023-09-20 NOTE — Telephone Encounter (Signed)
 Care Coordination  by Data Coordinator     Select F2 to Complete Form     Spoke with: Tristan Mcbride    Topic: Diabetic Labs   Outcome: Spoke to patient -Labs Ordered by Provider-spoke with PT agreed to completed-prior to appt.09/27/23.      Is the outreach for a Metric? Yes      By: Alfreda Anna   Date: 09/20/23

## 2023-09-22 ENCOUNTER — Other Ambulatory Visit
Admission: RE | Admit: 2023-09-22 | Discharge: 2023-09-22 | Disposition: A | Discharge status: Home / Self Care | Source: Ambulatory Visit | Attending: Internal Medicine | Admitting: Internal Medicine

## 2023-09-22 ENCOUNTER — Telehealth: Payer: Self-pay | Admitting: Internal Medicine

## 2023-09-22 DIAGNOSIS — E119 Type 2 diabetes mellitus without complications: Secondary | ICD-10-CM | POA: Insufficient documentation

## 2023-09-22 DIAGNOSIS — Z794 Long term (current) use of insulin: Secondary | ICD-10-CM | POA: Insufficient documentation

## 2023-09-22 LAB — MICROALBUMIN, URINE, RANDOM
Creatinine,UR: 158 mg/dL (ref 20–300)
Microalb/Creat Ratio: 125.5 mg MA/g CR — ABNORMAL HIGH (ref 0.0–29.9)
Microalbumin,UR: 19.83 mg/dL

## 2023-09-22 LAB — HEMOGLOBIN A1C: Hemoglobin A1C: 6 % — ABNORMAL HIGH

## 2023-09-22 NOTE — Telephone Encounter (Signed)
 Message Details  Received: 5 days ago  Bullock, Diane O, LPN sent to [facility] Nurse [place]         09/13/2023 - Results: Edi, External Ris In and others  (Newest Message First)              09/17/23  7:53 AM  Bullock, Diane O, LPN routed this conversation to [facility] Nurse [place] (Selected Message)    09/17/23  7:49 AM  Cleora Daft, RN routed this conversation to [facility]  John, Deepak, MD to [facility] Nurse Arlee Bellows  DJ      09/16/23  8:28 PM  Hello, can you please call [facility] to say that his CT Chest to look at his left lung nodule was stable and unchanged from his previous, not growing in size. Thank you!  View older events

## 2023-09-22 NOTE — Telephone Encounter (Signed)
Call to patient but no answer.  Left message to call office.  Call back number provided.    Please reach out to nursing using teams if patient calls back.

## 2023-09-23 NOTE — Telephone Encounter (Signed)
 Using language line interpreter services [phone] attempted to call patient. Left a message to call office; number provided [phone].

## 2023-09-26 ENCOUNTER — Ambulatory Visit: Payer: Self-pay

## 2023-09-27 ENCOUNTER — Other Ambulatory Visit
Admission: RE | Admit: 2023-09-27 | Discharge: 2023-09-27 | Disposition: A | Discharge status: Home / Self Care | Source: Ambulatory Visit | Attending: Internal Medicine | Admitting: Internal Medicine

## 2023-09-27 ENCOUNTER — Ambulatory Visit: Attending: Internal Medicine

## 2023-09-27 ENCOUNTER — Other Ambulatory Visit: Payer: Self-pay

## 2023-09-27 DIAGNOSIS — Z794 Long term (current) use of insulin: Secondary | ICD-10-CM

## 2023-09-27 DIAGNOSIS — E119 Type 2 diabetes mellitus without complications: Secondary | ICD-10-CM

## 2023-09-27 DIAGNOSIS — N400 Enlarged prostate without lower urinary tract symptoms: Secondary | ICD-10-CM

## 2023-09-27 DIAGNOSIS — I10 Essential (primary) hypertension: Secondary | ICD-10-CM

## 2023-09-27 DIAGNOSIS — E785 Hyperlipidemia, unspecified: Secondary | ICD-10-CM

## 2023-09-27 LAB — CBC AND DIFFERENTIAL
Baso # K/uL: 0.1 10*3/uL (ref 0.0–0.2)
Eos # K/uL: 0.2 10*3/uL (ref 0.0–0.5)
Hematocrit: 44 % (ref 37–52)
Hemoglobin: 13.9 g/dL (ref 12.0–17.0)
IMM Granulocytes #: 0.1 10*3/uL — ABNORMAL HIGH
IMM Granulocytes: 0.4 %
Lymph # K/uL: 3.9 10*3/uL (ref 1.0–5.0)
MCV: 86 fL (ref 75–100)
Mono # K/uL: 0.8 10*3/uL (ref 0.1–1.0)
Neut # K/uL: 6.3 10*3/uL (ref 1.5–6.5)
Nucl RBC # K/uL: 0 10*3/uL (ref 0.0–0.1)
Nucl RBC %: 0 /100{WBCs} (ref 0.0–0.2)
Platelets: 269 10*3/uL (ref 150–450)
RBC: 5.1 MIL/uL (ref 4.0–6.0)
RDW: 14.1 % (ref 0.0–15.0)
Seg Neut %: 55.7 %
WBC: 11.4 10*3/uL — ABNORMAL HIGH (ref 3.5–11.0)

## 2023-09-27 LAB — BASIC METABOLIC PANEL
Anion Gap: 13 (ref 7–16)
CO2: 25 mmol/L (ref 20–28)
Calcium: 10.6 mg/dL — ABNORMAL HIGH (ref 8.6–10.2)
Chloride: 104 mmol/L (ref 96–108)
Creatinine: 1.3 mg/dL — ABNORMAL HIGH (ref 0.67–1.17)
Glucose: 90 mg/dL (ref 60–99)
Lab: 21 mg/dL — ABNORMAL HIGH (ref 6–20)
Potassium: 4 mmol/L (ref 3.3–5.1)
Sodium: 142 mmol/L (ref 133–145)
eGFR BY CREAT: 57 — AB

## 2023-09-27 MED ORDER — ATORVASTATIN CALCIUM 20 MG PO TABS *I*
20.0000 mg | ORAL_TABLET | Freq: Every day | ORAL | 0 refills | Status: DC
Start: 2023-09-27 — End: 2023-12-24

## 2023-09-27 MED ORDER — TAMSULOSIN HCL 0.4 MG PO CAPS *I*
0.4000 mg | ORAL_CAPSULE | Freq: Every evening | ORAL | 0 refills | Status: DC
Start: 2023-09-27 — End: 2023-12-31

## 2023-09-27 MED ORDER — METFORMIN HCL 500 MG PO TABS *I*
500.0000 mg | ORAL_TABLET | Freq: Two times a day (BID) | ORAL | 3 refills | Status: AC
Start: 2023-09-27 — End: ?

## 2023-09-27 MED ORDER — LISINOPRIL 20 MG PO TABS *I*
20.0000 mg | ORAL_TABLET | Freq: Every day | ORAL | 0 refills | Status: DC
Start: 2023-09-27 — End: 2024-02-18

## 2023-09-27 NOTE — Progress Notes (Signed)
 [facility] Note     Reason For Visit:   No chief complaint on file.       HPI:     Tristan Mcbride is a 78 y.o. male with pmhx notable for HTN, T2DM, BPH, CKD, hypercalcemia with elevated PTHrP  who presents for follow up appointment.    Hypercalcemia   - LLL pulmonary nodule is unchanged on FU CT scan, recommending an additional scan in 12 months   - patient is a never smoking, does have a maternal hx of cancer but doesn't know any details about this   - denies any unintentional weight loss or night sweat, has excellent appetite  - denies cough or hemoptysisor chest pain     - does endorse back pain on the sides after lifting heavy objects about two months ago   - states he has chronic constipation for which he drinks plenty of water  and takes daily mirilax     Past Medical History: has Hypoglycemia; Hyperlipidemia; Essential Hypertension; Esophageal Reflux; Intramuscular Adipose Tissue Fibrolipoma; Rotator Cuff Tendonitis; BPH without obstruction/lower urinary tract symptoms; Sexual Dysfunction, NOS; Elevated PSA; Hyperglycemia; and DM (diabetes mellitus) on their problem list.   Medications:   Current Outpatient Medications   Medication Sig    lisinopril  (PRINIVIL ,ZESTRIL ) 20 mg tablet Take 1 tablet (20 mg total) by mouth daily.    ketorolac  (ACULAR ) 0.5 % ophthalmic solution Place 1 drop into both eyes 3 times daily for Cystoid Macular Edema  For 1 month then 2x/day for 1 month, then 1x/day for 1 month, then discontinue    tamsulosin  (FLOMAX ) 0.4 mg capsule Take 1 capsule (0.4 mg total) by mouth every evening.    prednisoLONE  acetate (PRED FORTE ) 1 % ophthalmic suspension Place 1 drop into both eyes 4 times daily    metFORMIN  (GLUCOPHAGE ) 500 mg tablet Take 1 tablet (500 mg total) by mouth 2 times daily (with meals).    acetaminophen  (TYLENOL ) 500 mg tablet 1 tablet as needed Orally every 6 hrs for 5 days    atorvastatin  (LIPITOR) 20 mg tablet Take 1 tablet (20 mg total) by mouth daily  (with dinner).    tadalafil  (CIALIS ) 5 MG tablet Take 1 tablet (5 mg total) by mouth daily.    ketoconazole  (NIZORAL ) 2 % cream Apply topically daily (Patient not taking: Reported on 05/07/2023)    senna (SENOKOT) 8.6 mg tablet Take 2 tablets by mouth daily (Patient not taking: Reported on 05/07/2023)    blood pressure monitor, automatic with arm cuff Use as directed to monitor blood pressure    psyllium (METAMUCIL SMOOTH TEXTURE) 28.3 % POWD powder 1 tablespoon in 8 oz of water  daily by mouth.     No current facility-administered medications for this visit.        Physical Exam:     Vitals:    09/27/23 1403   BP: 140/76   BP Location: Left arm   Patient Position: Sitting   Cuff Size: adult   Pulse: 72   Temp: 35.9 C (96.7 F)   TempSrc: Temporal   SpO2: 98%   Weight: 71.6 kg (157 lb 12.8 oz)      UJW:JXBJ mass index is 24.71 kg/m.    Physical Exam  Constitutional:       General: He is not in acute distress.     Appearance: He is toxic-appearing.   Cardiovascular:      Rate and Rhythm: Normal rate and regular rhythm.      Heart  sounds: No murmur heard.  Pulmonary:      Effort: No respiratory distress.      Breath sounds: Normal breath sounds.   Abdominal:      Palpations: Abdomen is soft.      Tenderness: There is no abdominal tenderness.   Musculoskeletal:      Comments: No midline spinal tenderness   Skin:     General: Skin is warm and dry.             Assessment and Plan:     Hypercalcemia with elevated PTHrP- no red flags on H+P but the concern still remains   - will repeat bmp and PTHrP  -, will update cbc as well   - referral to endocrinology pending repeat labs    Follow up:  1 week to review blood work and next steps    Health Maintenance: These screening recommendations are based on USPSTF, [employer], and [place] state guidelines   Topic Date Due    DTaP/Tdap/Td Vaccines (1 - Tdap) Never done    Shingles Vaccine (1 of 2) Never done    Colon Cancer Screening - Other  02/21/2020    Diabetic Foot Exam   05/05/2022    COVID-19 Vaccine (6 - 2024-25 season) 12/13/2022    Depression - Yearly  05/13/2023    Diabetic Kidney Disease Blood  11/27/2023    Fall Risk Screening  01/19/2024    Diabetic A1C Monitoring  03/23/2024    Diabetic Kidney Disease Urine  09/21/2024    Diabetic Eye Exam  09/07/2025    Flu Shot  Completed    HIV Screening  Completed    Hepatitis C Screening  Completed    Pneumococcal Vaccination  Completed    Hepatitis B Vaccine  Aged Out    HIB Vaccine  Aged Out    HPV Vaccine  Aged Out    Meningococcal Vaccine  Aged Out    Rotavirus Vaccine  Aged Out    Meningitis Vaccine  Aged Out     Author:   Loralie Rocher, MD, Internal Medicine 09/27/2023 2:06 PM

## 2023-09-27 NOTE — Patient Instructions (Addendum)
 Get blood work today. Pending results will likely you to see a high calcium  specialist. Return to clinic in one week       Hgase un anlisis de [employer]. Los resultados pendientes probablemente le obligarn a Science writer con un especialista en calcio elevado. Regresar a la Engineer, manufacturing systems

## 2023-10-03 LAB — PTH-RELATED PEPTIDE: PTH-Related Protein: 2.5 pmol/L — ABNORMAL HIGH (ref 0.0–2.3)

## 2023-10-04 ENCOUNTER — Ambulatory Visit: Attending: Internal Medicine | Admitting: Internal Medicine

## 2023-10-04 ENCOUNTER — Encounter: Payer: Self-pay | Admitting: Internal Medicine

## 2023-10-04 ENCOUNTER — Other Ambulatory Visit: Payer: Self-pay

## 2023-10-04 ENCOUNTER — Ambulatory Visit: Payer: Self-pay

## 2023-10-04 DIAGNOSIS — R9389 Abnormal findings on diagnostic imaging of other specified body structures: Secondary | ICD-10-CM

## 2023-10-04 NOTE — Patient Instructions (Addendum)
 I refered you to endocrinology to evaluate your elevated calcium  levels.   Please call the number below to  schedule your appointment.   [phone]    Te remit a endocrinologa para evaluar tus niveles elevados de calcio.  Llame al nmero que aparece a continuacin para programar su cita.  [phone]

## 2023-10-04 NOTE — Progress Notes (Signed)
 [facility]       Reason for visit: The primary encounter diagnosis was Hypercalcemia. A diagnosis of Abnormal CT scan, chest was also pertinent to this visit.  Encounter Diagnoses   Name Primary?    Hypercalcemia Yes    Abnormal CT scan, chest        HPI:  Tristan Mcbride is 78 y.o. year old male with history of HTN, T2DM, BPH, CKD, hypercalcemia with elevated PTHrP who presents for follow up appointment.   - Last seen at [facility] on 09/27/23.   Subjective     History of Present Illness  The patient is here today to review his lab work. He has been taking all medications as prescribed. He is accompanied by his wife.    [employer] Spanish interpreter present for the duration of the visit.     He reports no current health concerns.      Past Medical History:   Diagnosis Date    BPH (benign prostatic hypertrophy)     DM (diabetes mellitus) 10/13/2017    Esophageal reflux     Fibrolipoma     intramuscular adipose tissue    Hyperlipidemia     Hypertension     Hypoglycemia     Kidney cyst     Rotator cuff tendinitis     Right    Sexual dysfunction     absent ejaculation    Type 2 diabetes mellitus     Weight loss        Medications:     Current Outpatient Medications   Medication Sig    tamsulosin  (FLOMAX ) 0.4 mg capsule Take 1 capsule (0.4 mg total) by mouth every evening.    metFORMIN  (GLUCOPHAGE ) 500 mg tablet Take 1 tablet (500 mg total) by mouth 2 times daily (with meals).    lisinopril  (PRINIVIL ,ZESTRIL ) 20 mg tablet Take 1 tablet (20 mg total) by mouth daily.    atorvastatin  (LIPITOR) 20 mg tablet Take 1 tablet (20 mg total) by mouth daily (with dinner).    ketorolac  (ACULAR ) 0.5 % ophthalmic solution Place 1 drop into both eyes 3 times daily for Cystoid Macular Edema  For 1 month then 2x/day for 1 month, then 1x/day for 1 month, then discontinue    prednisoLONE  acetate (PRED FORTE ) 1 % ophthalmic suspension Place 1 drop into both eyes 4 times daily    acetaminophen  (TYLENOL ) 500 mg tablet 1 tablet as needed  Orally every 6 hrs for 5 days    tadalafil  (CIALIS ) 5 MG tablet Take 1 tablet (5 mg total) by mouth daily.    blood pressure monitor, automatic with arm cuff Use as directed to monitor blood pressure    psyllium (METAMUCIL SMOOTH TEXTURE) 28.3 % POWD powder 1 tablespoon in 8 oz of water  daily by mouth.     No current facility-administered medications for this visit.       Medication list reconciled this visit    Allergies:   Allergies[1]    Social history      Social History     Tobacco Use    Smoking status: Never    Smokeless tobacco: Never   Substance Use Topics    Alcohol  use: No       Review of Systems     Review of Systems   All other systems reviewed and are negative.    12 point review of system was negative except those mentioned in HPI     Objective     Physical  Exam:     Physical Exam  Constitutional:       General: He is not in acute distress.     Appearance: Normal appearance. He is not ill-appearing.   Cardiovascular:      Rate and Rhythm: Normal rate and regular rhythm.      Pulses: Normal pulses.      Heart sounds: Normal heart sounds. No murmur heard.     No friction rub. No gallop.   Pulmonary:      Effort: Pulmonary effort is normal.      Breath sounds: Normal breath sounds. No wheezing or rhonchi.   Neurological:      Mental Status: He is alert.   Psychiatric:         Mood and Affect: Mood normal.         Behavior: Behavior normal.              Vitals:    10/04/23 0823   BP: 116/64   BP Location: Left arm   Patient Position: Sitting   Cuff Size: adult   Pulse: 64   Temp: 36 C (96.8 F)   TempSrc: Temporal   SpO2: 97%   Weight: 69.9 kg (154 lb)     Wt Readings from Last 3 Encounters:   10/04/23 69.9 kg (154 lb)   09/27/23 71.6 kg (157 lb 12.8 oz)   05/07/23 70.3 kg (155 lb)     BP Readings from Last 3 Encounters:   10/04/23 116/64   09/27/23 140/76   05/07/23 134/70              ASSESSMENT/PLAN:     Assessment & Plan  1. Hypercalcemia.  Persistent hypercalcemia of unknown origin at this time.   A  referral to endocrinology has been placed, and the phone number has been provided in the patient instructions.   The plan was discussed with the patient, who agrees with it.    2. Abnormal CT scan of chest  A CT scan from 09/13/2023, shows that his left lung nodule is stable.   Repeat CT scan imaging will be needed in 1 year.   A follow-up appointment in the clinic has been scheduled for 3 months.            Follow up: Follow up in about 3 months (around 01/04/2024) for Follow-up.      Patient Instructions   I refered you to endocrinology to evaluate your elevated calcium  levels.   Please call the number below to  schedule your appointment.   [phone]    Te remit a endocrinologa para evaluar tus niveles elevados de calcio.  Llame al nmero que aparece a continuacin para programar su cita.  [phone]         Signed by Lorita Kerns, NP [facility] 6/23/20258:49 AM         [1]   Allergies  Allergen Reactions    Tetanus Toxoids      On [facility] Int [facility] note from 08/20/09

## 2023-10-11 ENCOUNTER — Telehealth: Payer: Self-pay | Admitting: Surgery

## 2023-10-11 NOTE — Telephone Encounter (Signed)
 LVM for patient to confirm colonoscopy scheduled on 10/21/23 with Dr. Lise     Prep instructions via mail

## 2023-10-18 ENCOUNTER — Encounter: Payer: Self-pay | Admitting: Surgery

## 2023-10-18 ENCOUNTER — Other Ambulatory Visit: Payer: Self-pay | Admitting: Internal Medicine

## 2023-10-18 NOTE — Telephone Encounter (Signed)
 Refill request routed to Dr. Norleen   10/18/2023 3:51 PM

## 2023-10-18 NOTE — Telephone Encounter (Signed)
 Copied from CRM [id number]. Topic: Medications/Prescriptions - Refill Request  >> Oct 18, 2023  3:23 PM Leotis GAILS wrote:  Tessa, patients wife, calling to request prescription(s)     Requested Prescriptions     Pending Prescriptions Disp Refills    ketorolac  (ACULAR ) 0.5 % ophthalmic solution 5 mL 4     Sig: Place 1 drop into both eyes 3 times daily for Cystoid Macular Edema  For 1 month then 2x/day for 1 month, then 1x/day for 1 month, then discontinue    prednisoLONE  acetate (PRED FORTE ) 1 % ophthalmic suspension 5 mL 2     Sig: Place 1 drop into both eyes 4 times daily              to be sent to the following Pharmacy [facility] [phone] - [place], [place] - [address] [facility] [address]    Is patient out of the medication? yes    Has the patient contacted their pharmacy for a refill? yes    Patient's last visit? 6.23.2025    Were the demographics and insurance verified?: Yes    Tessa can be reached if necessary at [phone] .

## 2023-10-18 NOTE — Progress Notes (Signed)
 Colonoscopy on 10/21/23 authorization.

## 2023-10-19 ENCOUNTER — Other Ambulatory Visit: Payer: Self-pay | Admitting: Optometry

## 2023-10-19 MED ORDER — PREDNISOLONE ACETATE 1 % OP SUSP *I*
1.0000 [drp] | Freq: Two times a day (BID) | OPHTHALMIC | 2 refills | Status: DC
Start: 2023-10-19 — End: 2023-12-22

## 2023-10-19 MED ORDER — PREDNISOLONE ACETATE 1 % OP SUSP *I*
1.0000 [drp] | Freq: Four times a day (QID) | OPHTHALMIC | 2 refills | Status: DC
Start: 2023-10-19 — End: 2023-10-19

## 2023-10-19 MED ORDER — KETOROLAC TROMETHAMINE 0.5 % OP SOLN *I*
1.0000 [drp] | Freq: Two times a day (BID) | OPHTHALMIC | 4 refills | Status: DC
Start: 2023-10-19 — End: 2023-12-22

## 2023-10-19 NOTE — Telephone Encounter (Signed)
 Refill request routed to Dr. Norleen   10/19/2023 4:25 PM

## 2023-10-19 NOTE — Telephone Encounter (Signed)
 Copied from CRM #[id number]. Topic: Medications/Prescriptions - Prescription Status/Information  >> Oct 19, 2023  4:09 PM Avelina POUR wrote:  Sheree following up on refill request(s) for eye drops: ketorolac  (ACULAR ) 0.5 % ophthalmic solution and prednisoLONE  acetate (PRED FORTE ) 1 % ophthalmic suspension Patient states the [facility] has closed and needs the prescriptions sent to [facility] .    Patient states no problem with his eyes currently.  Patient states he has had surgery on his eyes and is not suppose to be without the eye drops.      Direct phone number for his eye doctor at [facility] was provided unable to connect the call to [facility] spanish interpreter was conferenced in during the conversation - no answer on the line.     Patient phone number left [facility] queue  -  direct return call to [facility]       Patient has been with out his eye drops since yesterday and has a concern for getting his prescription sent in today.      They are requesting a return call  [phone]      Call assisted with spanish interpreter # [phone] [facility]

## 2023-10-19 NOTE — Addendum Note (Signed)
 Addended byBETHA FREIDA NEST on: 10/19/2023 05:51 PM     Modules accepted: Orders

## 2023-10-21 ENCOUNTER — Other Ambulatory Visit: Payer: 59 | Admitting: Surgery

## 2023-11-06 ENCOUNTER — Encounter: Payer: Self-pay | Admitting: Student in an Organized Health Care Education/Training Program

## 2023-11-22 ENCOUNTER — Telehealth: Payer: Self-pay | Admitting: Internal Medicine

## 2023-11-22 NOTE — Telephone Encounter (Signed)
 Writer called patient to advise 12/17/23 appointment has been rescheduled to Friday 12/24/2023 at 8 am due to a change in provider's schedule. Writer spoke with patient's wife and just asked if patient call the office back at [phone] when he is available as she is not listed on the FYI tab.Letter also mailed to address on file. If patient calls back, please relay new appointment information.

## 2023-11-26 IMAGING — CT TC - Abdomen Superior
1 series · 15 of 32 positions shown, 19 images · non-contrast
Comparison: none

[Series 201: s/c · axial · U · 0.87mm/px · z∈[+1251,+1718]mm · 15 of 518 slices shown, 19 images]
[im 34/518  soft-tissue]
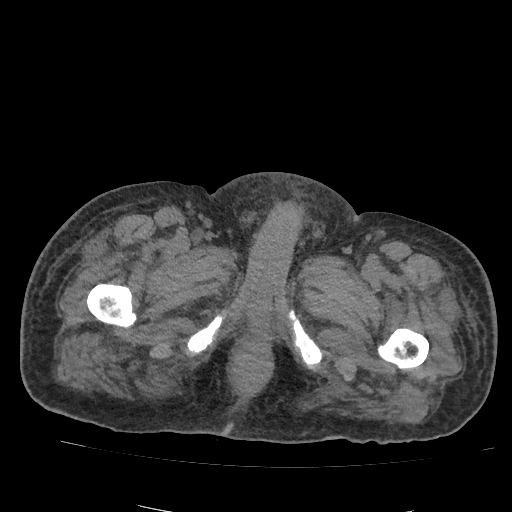
[im 34/518  bone]
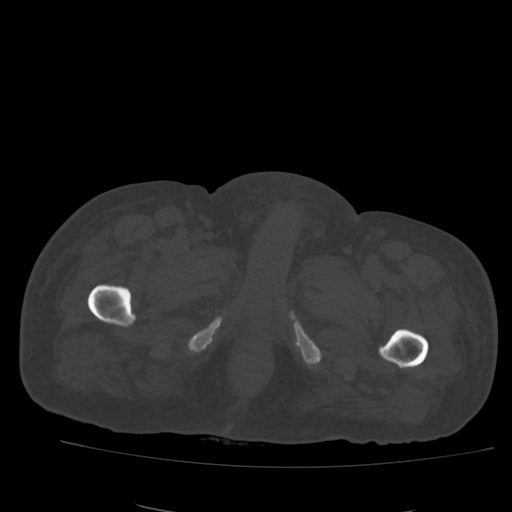
[im 67/518  soft-tissue]
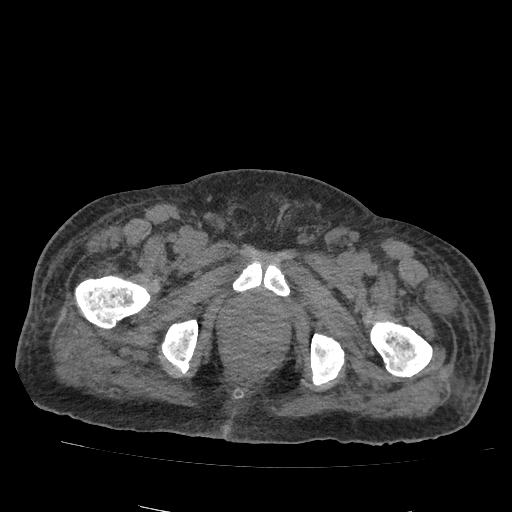
[im 101/518  soft-tissue]
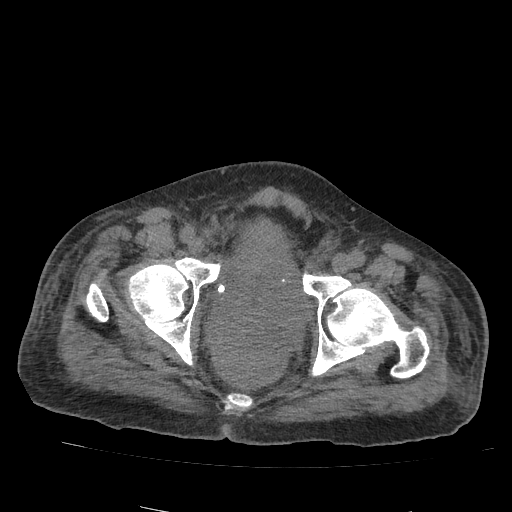
[im 151/518  soft-tissue]
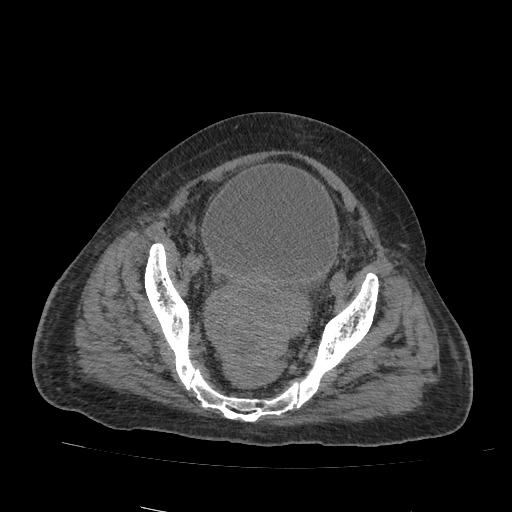
[im 184/518  soft-tissue]
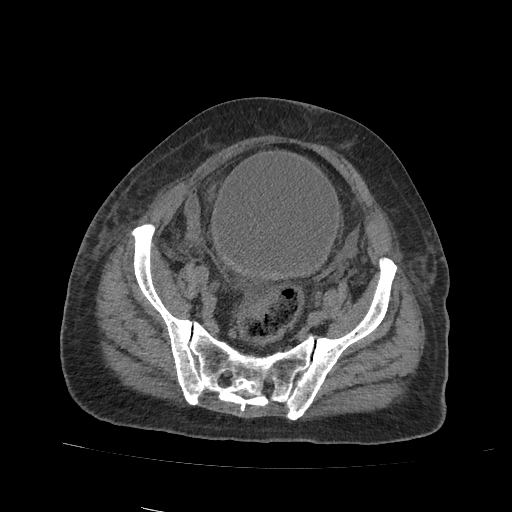
[im 217/518  soft-tissue]
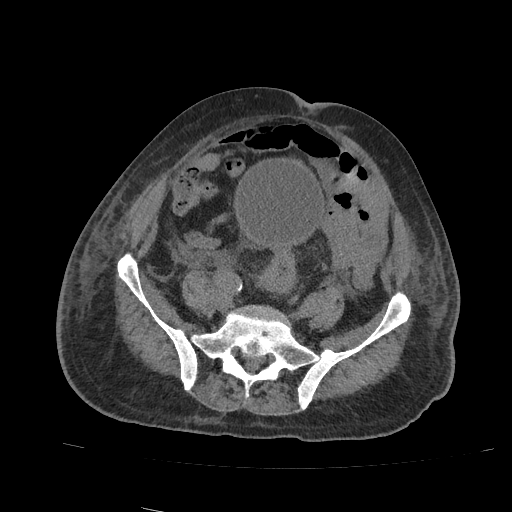
[im 267/518  soft-tissue]
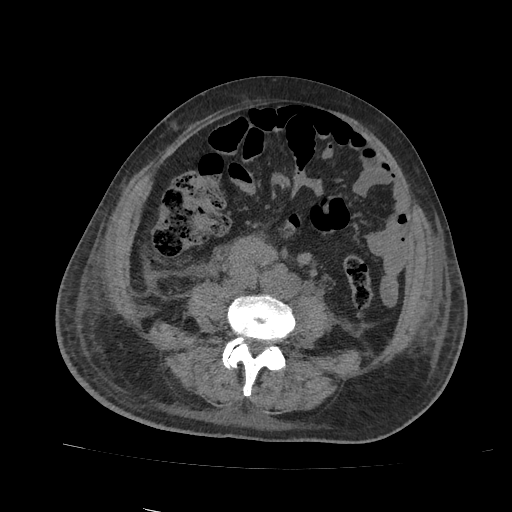
[im 301/518  soft-tissue]
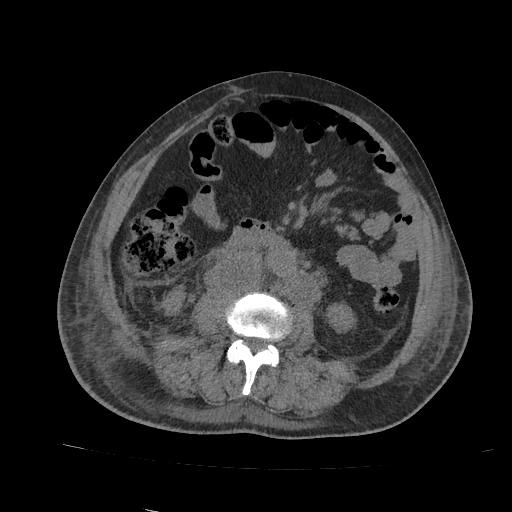
[im 334/518  soft-tissue]
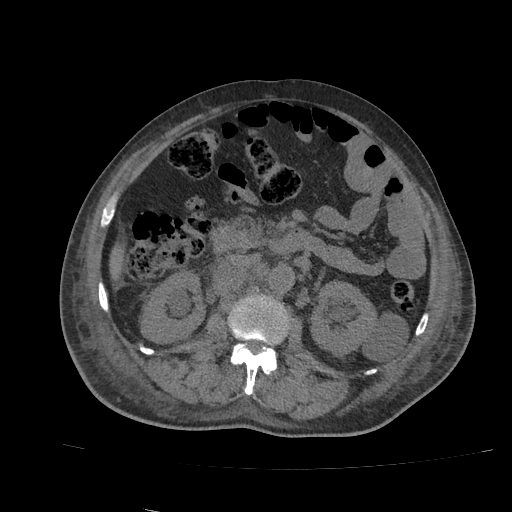
[im 334/518  bone]
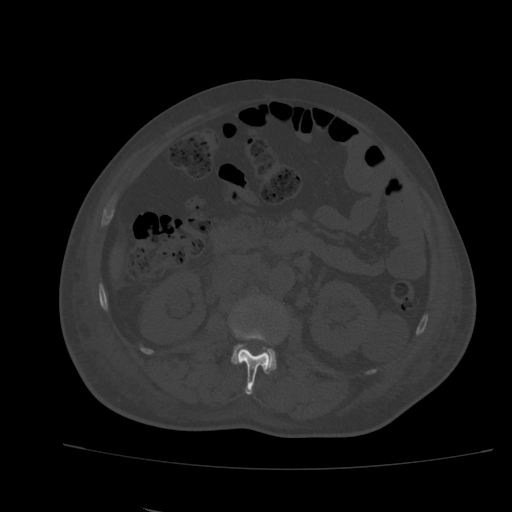
[im 367/518  soft-tissue]
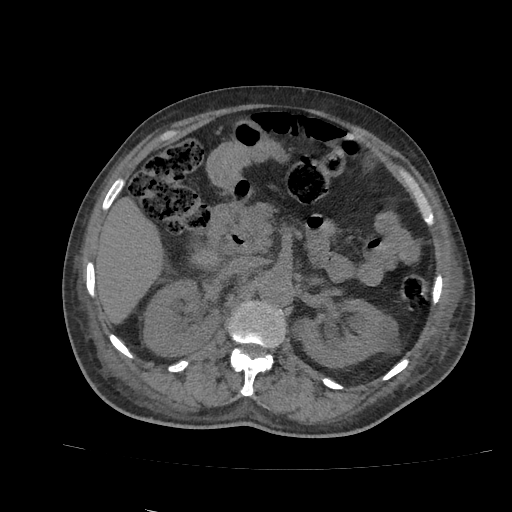
[im 417/518  soft-tissue]
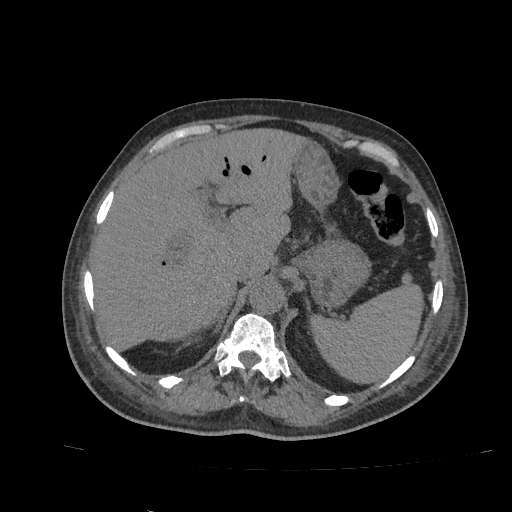
[im 451/518  soft-tissue]
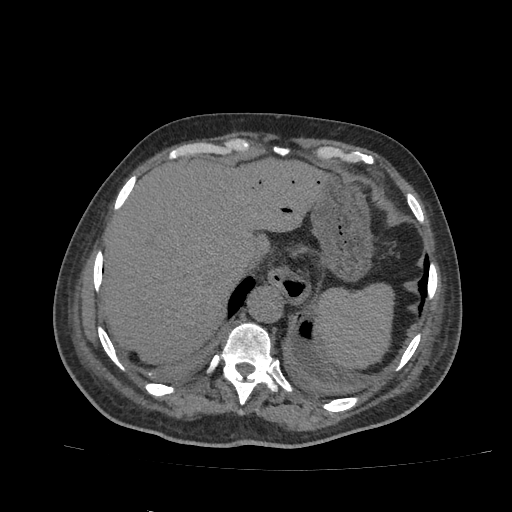
[im 451/518  lung]
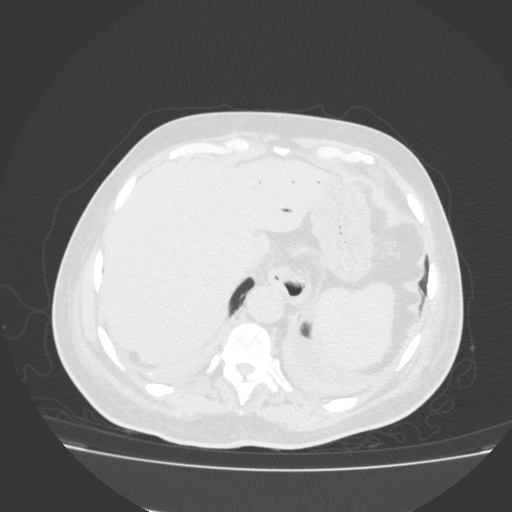
[im 467/518  lung]
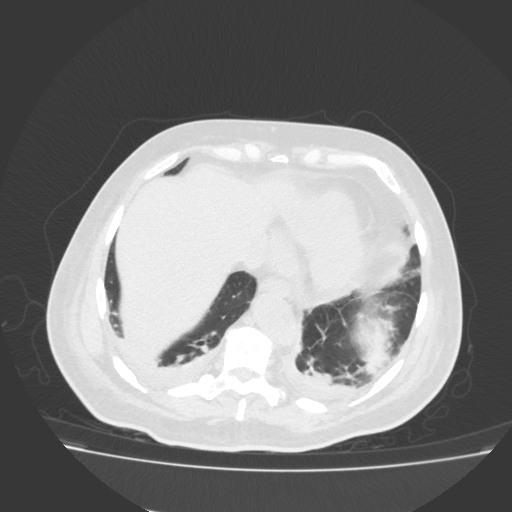
[im 484/518  soft-tissue]
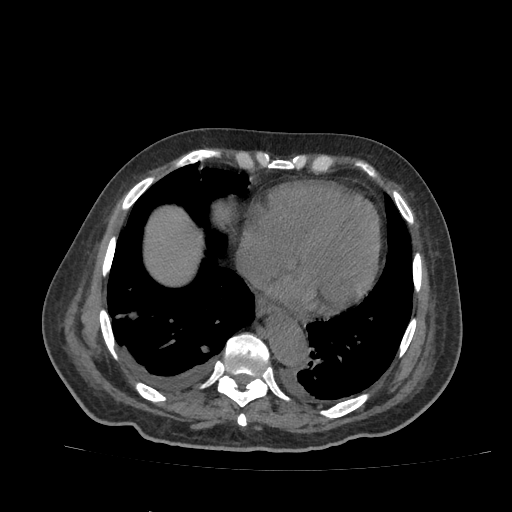
[im 484/518  lung]
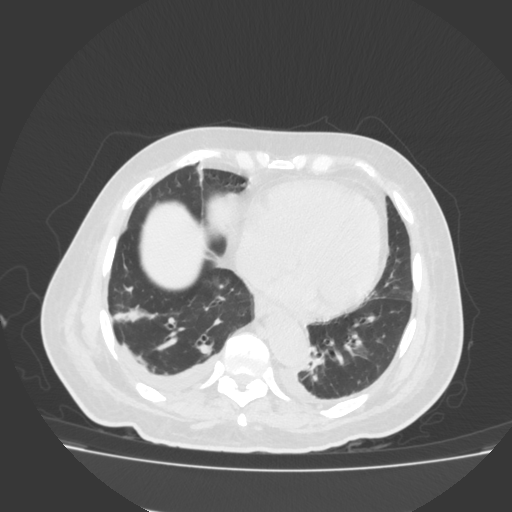
[im 501/518  lung]
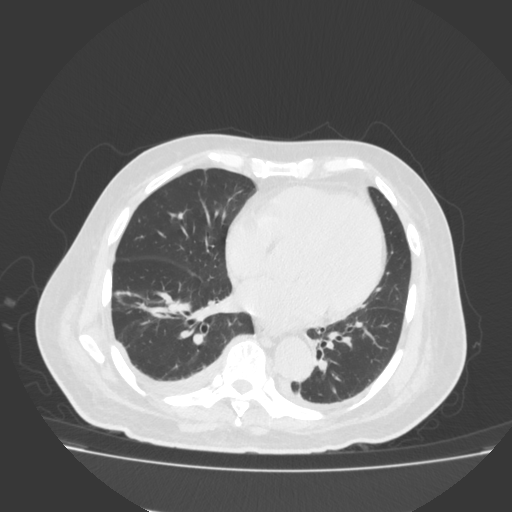

[15 of 32 positions shown; findings below may reference images not displayed]

Gênero:Masculino
Técnica:
O estudo tomográﬁco do abdômen foi adquirido no plano axial  com aquisições volumétricas e reconstruções nos
planos sagital e coronal. O exame foi realizado na fase simples sem a infusão de  contraste iodado endovenoso. .
Relatório:
O estudo atual e a comparação com o exame prévio de 11/16/23 permite assinalar: 
TOMOGRAFIA COMPUTADORIZADA DO ABDOME SUPERIOR E PELVE
Fígado com dimensões, morfologia e densidade normais. Presença de ar na via biliar. Imagens compatíveis com
circulação colateral junto ao abdômen superior.
Não há dilatação das vias biliares intra ou extra-hepáticas.
Pâncreas parcialmente visibilizado em exame sem contraste sem sinais deﬁnidos de lesões focais.
Baço com topograﬁa, dimensões e densidade normais. Adrenais com aspecto preservado.
Rins tópicos, com dimensões preservadas. Dilatação do sistema coletor dos dois rins a partir da bexiga urinária, que
contém importante resíduo. Presença de cistos renais
Permanecem os importantes linfonodomegalias no retroperitônio, especialmente à direita da linha média.
Aorta e veia cava inferior com trajeto e calibre normais.
Bexiga urinária com importante resíduo.Permanece a lesão ocupando espaço posterior à bexiga urinária e anterior ao
reto, que pode estar relacionada à glândula prostática.
Demais estruturas pélvicas sem particularidades.
Impressão:
O estudo tomográﬁco realizado em comparação ao exame prévio observa-se a manutenção das lesões descritas.
[facility] [address] [place] [id number], [place]

## 2023-11-28 IMAGING — CT TC - Abdomen Superior
2 of 6 series · 12 of 32 positions shown, 17 images · IV contrast (C+)
Comparison: none

[Series 601: portal · axial · portal-venous · U · 0.85mm/px · z∈[+1436,+1802]mm · 6 of 514 slices shown, 11 images]
[im 74/514  soft-tissue]
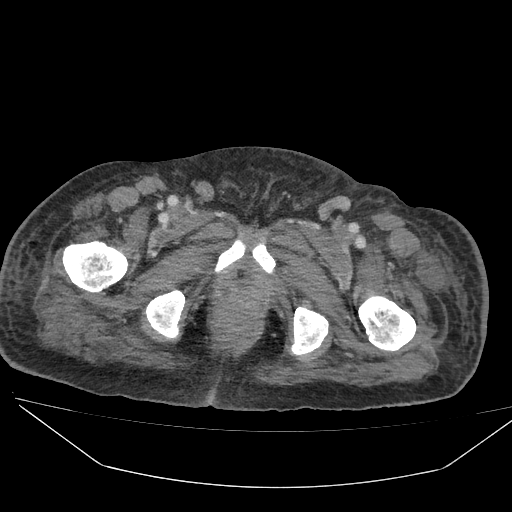
[im 74/514  bone]
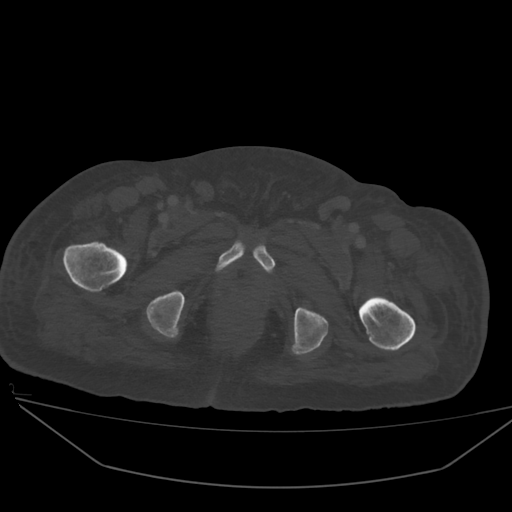
[im 147/514  soft-tissue]
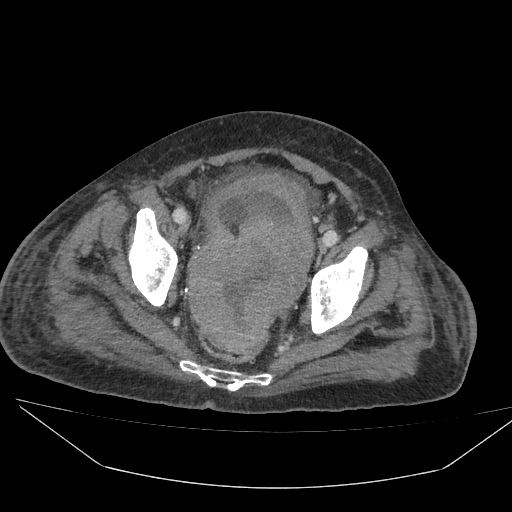
[im 220/514  soft-tissue]
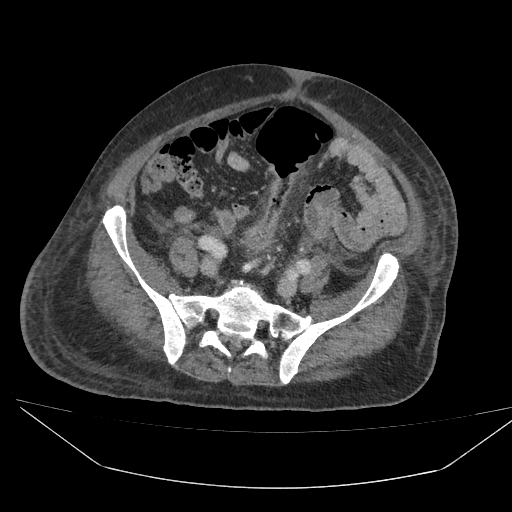
[im 220/514  lung]
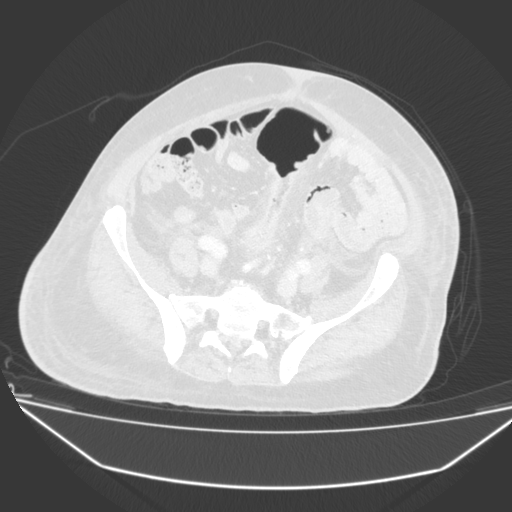
[im 294/514  soft-tissue]
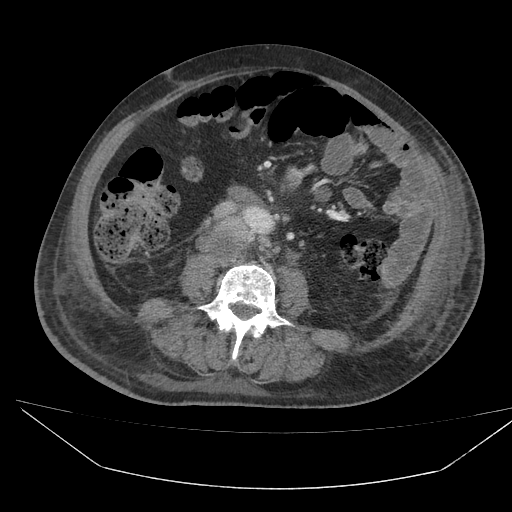
[im 294/514  lung]
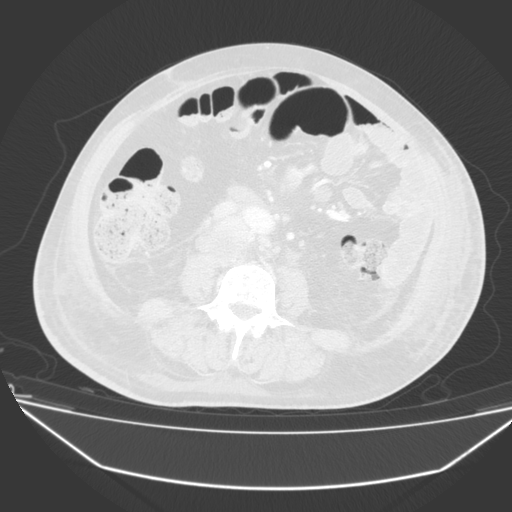
[im 367/514  soft-tissue]
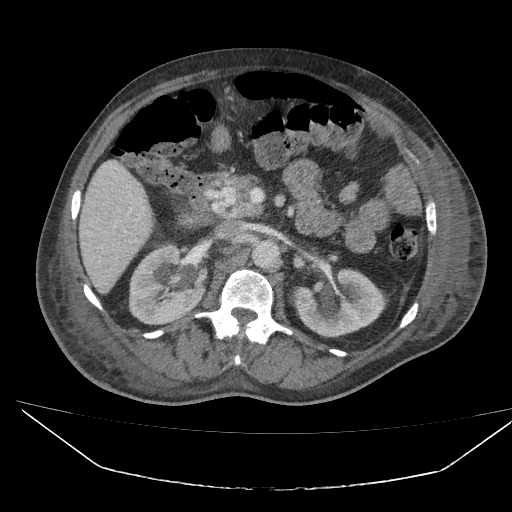
[im 367/514  lung]
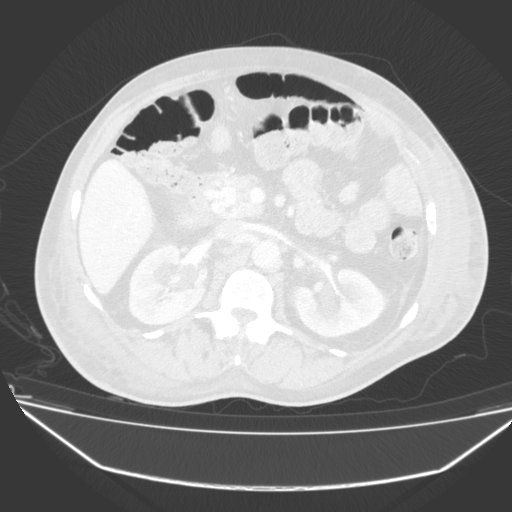
[im 440/514  soft-tissue]
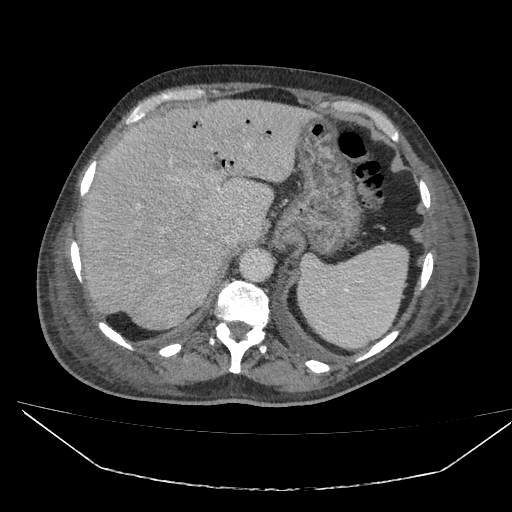
[im 440/514  lung]
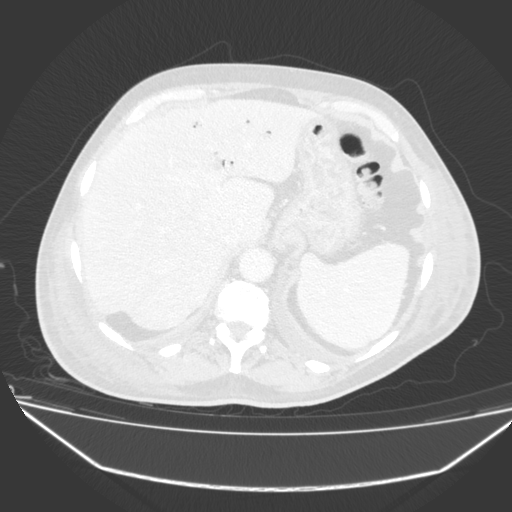

[Series 701: tardia · axial · U · 0.85mm/px · z∈[+1435,+1802]mm · 6 of 515 slices shown]
[im 74/515  soft-tissue]
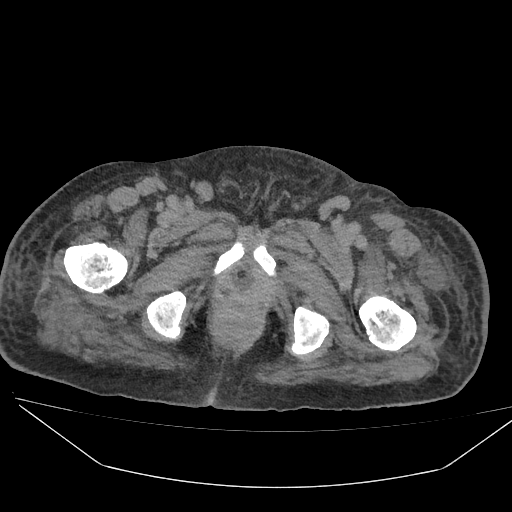
[im 147/515  soft-tissue]
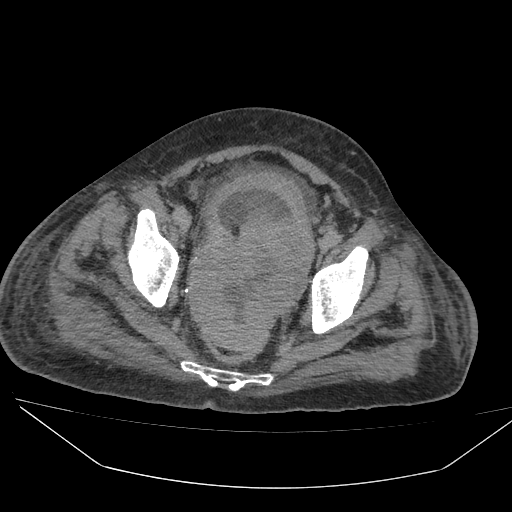
[im 221/515  soft-tissue]
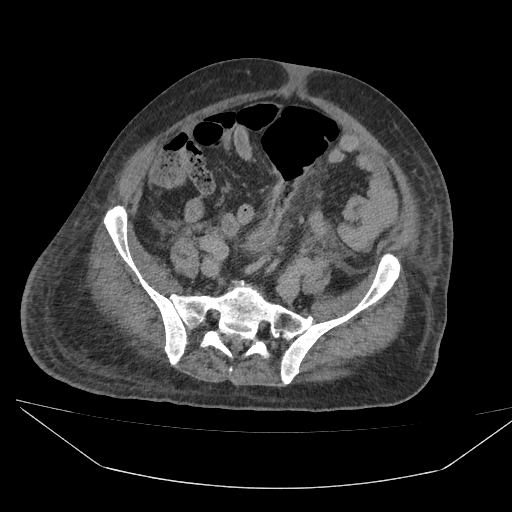
[im 294/515  soft-tissue]
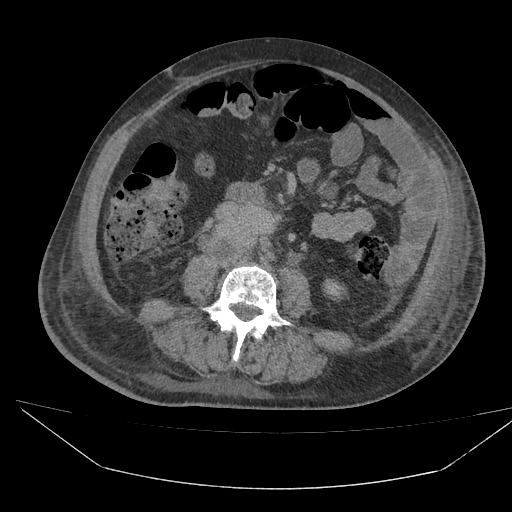
[im 368/515  soft-tissue]
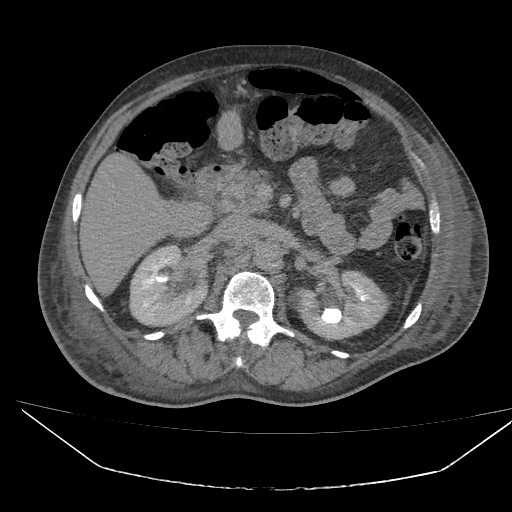
[im 441/515  soft-tissue]
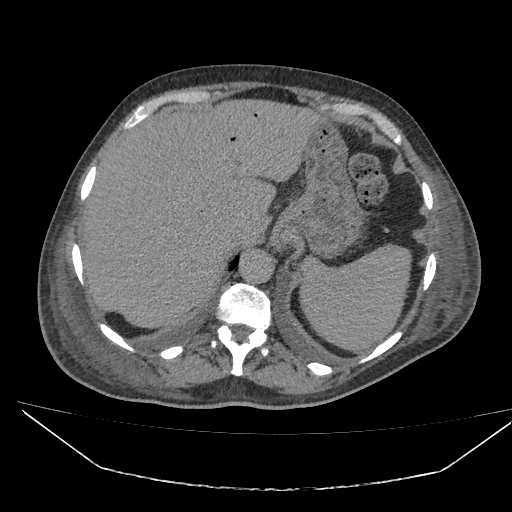

[12 of 32 positions shown; findings below may reference images not displayed]

Gênero:Masculino
METODOLOGIA:
Aquisição helicoidal do abdome com equipamento multidetectores, sendo realizadas reconstruções multiplanares.
Meios de contraste: intravenoso (iodado não-iônico).
Fases do exame: pré-contraste, arterial, portal e de equilíbrio. 
ANÁLISE:
TOMOGRAFIA COMPUTADORIZADA DO ABDOME SUPERIOR + PELVE
Hérnia de hiato deslizante.
Fígado de dimensões normais, contornos regulares e bordos ﬁnos. Parênquima com Slvaﬁcientes de atenuação
preservados. Ausência de lesões focais. Veias hepáticas e ramos portais pérvios. Observam-se múltiplas colaterais
venosas na porta hepática, indicativa de transformação cavernosa da veia porta.
Vesícula biliar não caracterizada (pós-operatório).
Dilatação das vias biliares intra e extra-hepáticas com gás na arvore biliar.
Pâncreas de dimensões normais e Slvaﬁcientes de atenuação homogêneos. Não há dilatação do ducto pancreático
principal.
Baço de dimensões normais, contornos regulares e Slvaﬁcientes de atenuação homogêneos.
Rins tópicos, de dimensões normais, contornos regulares e Slvaﬁcientes de atenuação heterogêneos à custa de cistos
corticais no rim esquerdo medindo até 4,5 cm (Bosniak I). Apresentam boa capacidade de concentração e excreção do
contraste intravenoso. Ausência de cálculos. Hidronefrose leve bilateral.
Adrenais de dimensões e morfologia normais.
Aorta e artérias ilíacas com trajeto habitual e calibre preservado.
Veia cava inferior dentro da normalidade.
Linfonodomegalias nas cadeias retrocaval, interaortocaval, aórtica lateral e ilíaca comum esquerda.
Ausência de líquido livre ou coleções na cavidade peritoneal.
[facility] - [address] [place] [id number], [place]
Gênero:Masculino
Alças intestinais sem alterações signiﬁcativas.
Volumosa massa prostática, com atenuação heterogênea, de limites parcialmente deﬁnidos, com sinais de extensão
extraprostática, invadindo a bexiga, vesículas seminais e o reto.
Bexiga sondada e de paredes espessadas.
Alterações degenerativas da coluna lombar.
ACHADO ADICIONAL:
Derrame pleural bilateral.
IMPRESSÃO DIAGNÓSTICA:
Estudo para seguimento oncológico de neoplasia prostática com sinais de extensão extraprostática. Os demais
achados estão acima pormenorizados.
[facility] - [address] [place] [id number], [place]

## 2023-12-07 IMAGING — CT TC - Abdomen Superior
1 series · 14 of 32 positions shown, 18 images · non-contrast
Comparison: none

------------- REPORT [id number] -------------
Gender: Male
TECHNIQUE: Volumetric acquisitions with multiplanar reconstructions.
Report:
Additionally, a small left pleural effusion is observed.
Sliding hiatal hernia.
COMPUTED TOMOGRAPHY OF THE UPPER ABDOMEN
Increased density of the subcutaneous tissue, inferring edema.
Liver with normal dimensions, morphology, and density.
Absent gallbladder.
Dilation of intra- (more evident in the left hepatic lobe) and extra-hepatic biliary ducts with gas in the biliary tree.
Pancreas with preserved morphology and attenuation coefficient. There is no dilation of the main pancreatic duct.
Spleen with normal topography, dimensions, and density.
Adrenals with preserved appearance.
Topical kidneys, with preserved dimensions and contours. There are no stones. Mild hydronephrosis on the right.
Hypoattenuating images, probable cortical cysts in the left kidney measuring up to 4.5 cm (Bosniak I).
Aorta and inferior vena cava with normal course and caliber.
Moderate air distension of the colonic loops, with no signs of obstruction by the method.
Small amount of free perisplenic fluid.
Lymph node enlargement in the retrocaval, interaortocaval, lateral aortic, and left common iliac chains.
Degenerative changes of the dorsolumbar spine.
Bladder with thickened walls, containing a Foley catheter balloon inside.
Voluminous prostatic mass, with heterogeneous attenuation, partially defined borders, with signs of extraprostatic extension, invading the bladder, seminal vesicles, and rectum.
COMPUTED TOMOGRAPHY OF THE MALE PELVIS
Left common iliac lymphadenomegaly.
Absence of free fluid or collections in the peritoneal cavity.
Degenerative changes in the lumbar spine.
Thickening of the subcutaneous tissue, inferring edema.

[Series 201: s/c · axial · U · 0.84mm/px · z∈[+1444,+1894]mm · 14 of 500 slices shown, 18 images]
[im 33/500  soft-tissue]
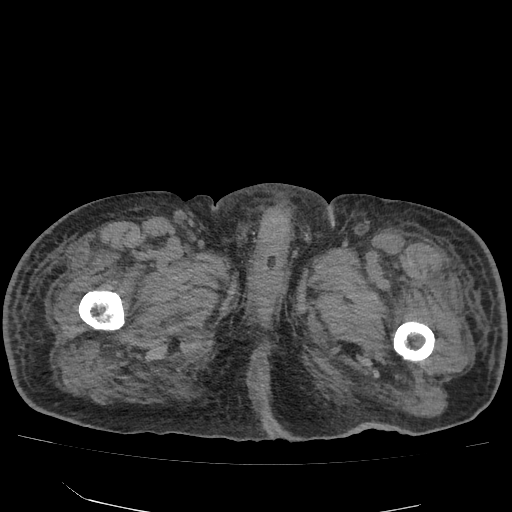
[im 33/500  bone]
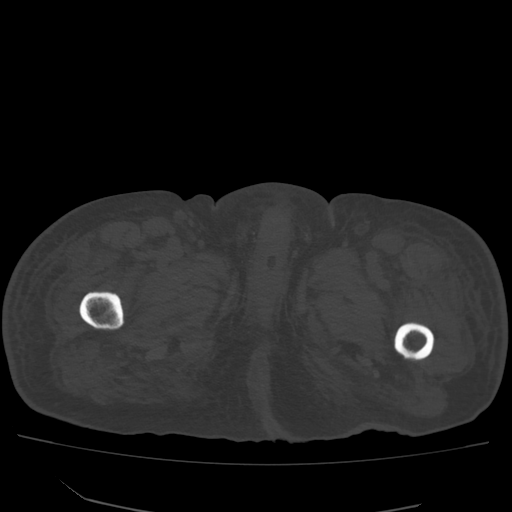
[im 65/500  soft-tissue]
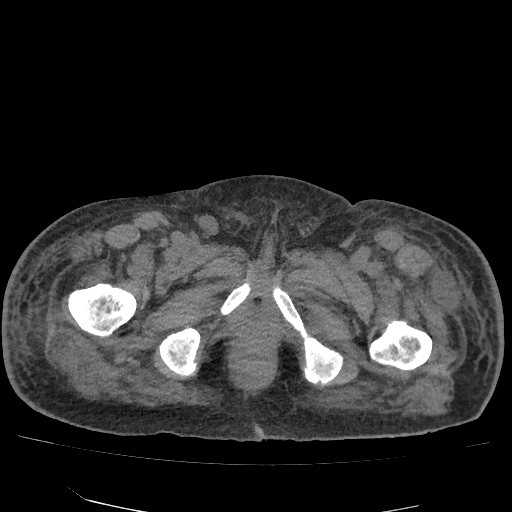
[im 113/500  soft-tissue]
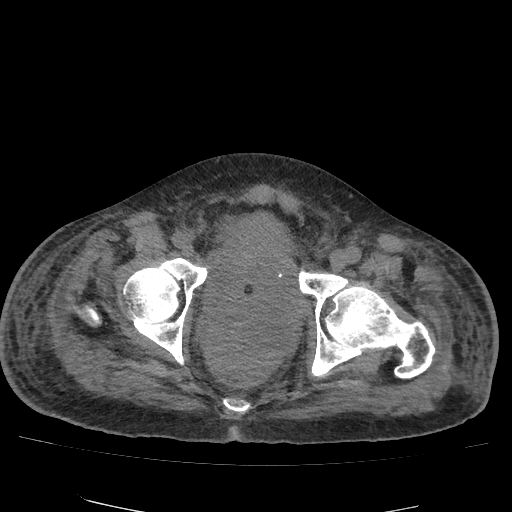
[im 145/500  soft-tissue]
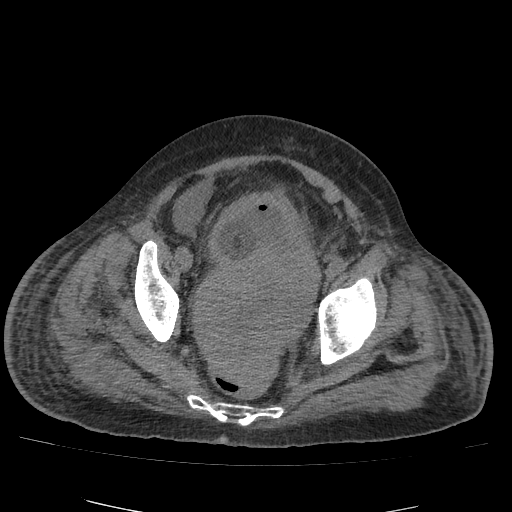
[im 194/500  soft-tissue]
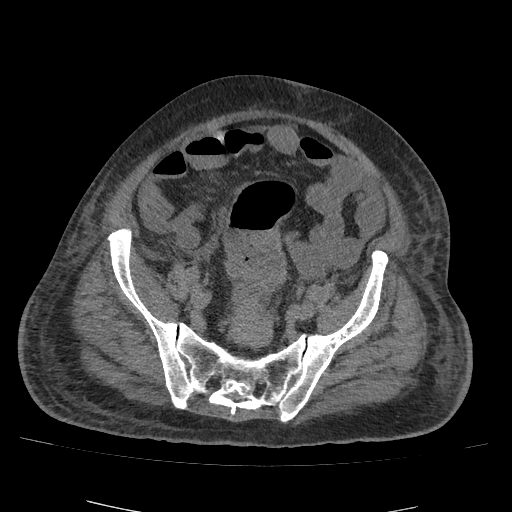
[im 226/500  soft-tissue]
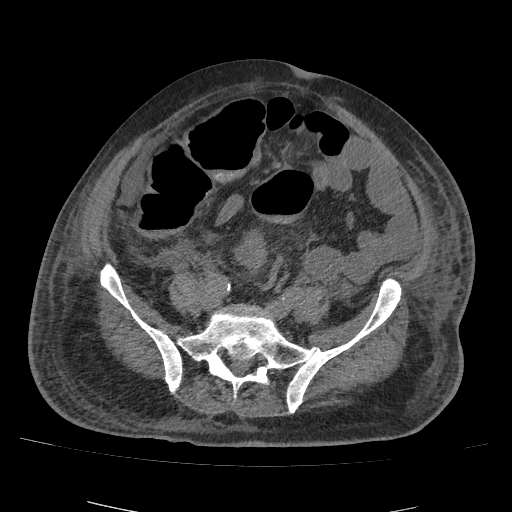
[im 274/500  soft-tissue]
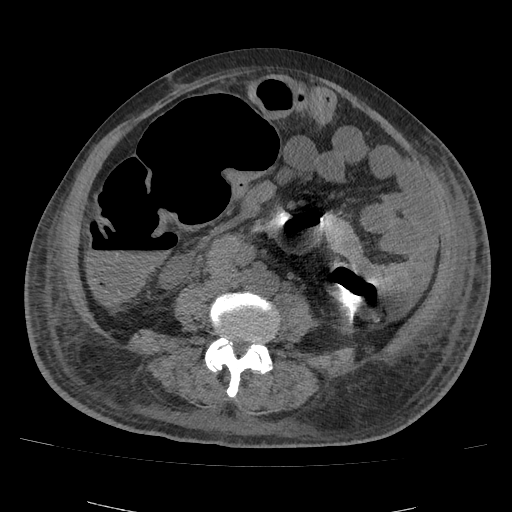
[im 306/500  soft-tissue]
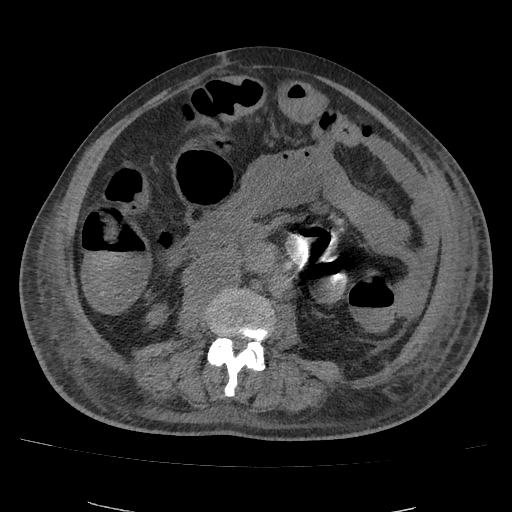
[im 355/500  soft-tissue]
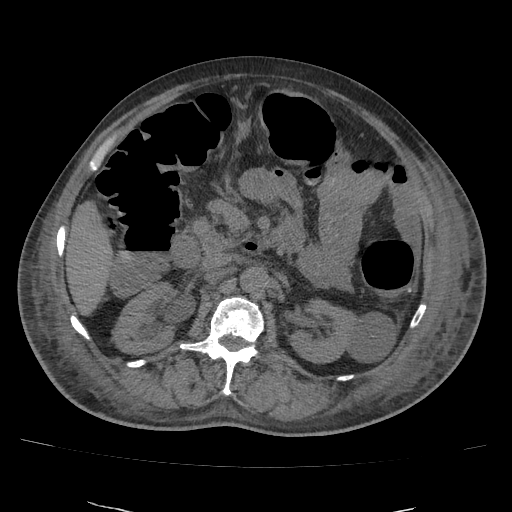
[im 355/500  bone]
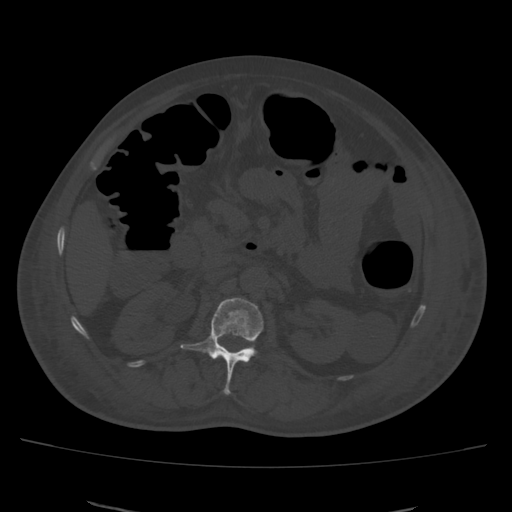
[im 387/500  soft-tissue]
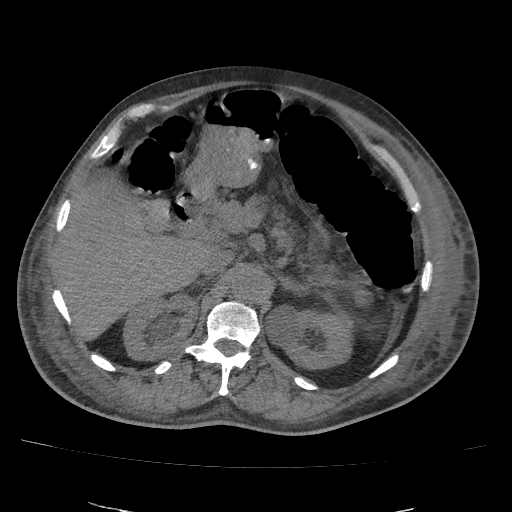
[im 435/500  soft-tissue]
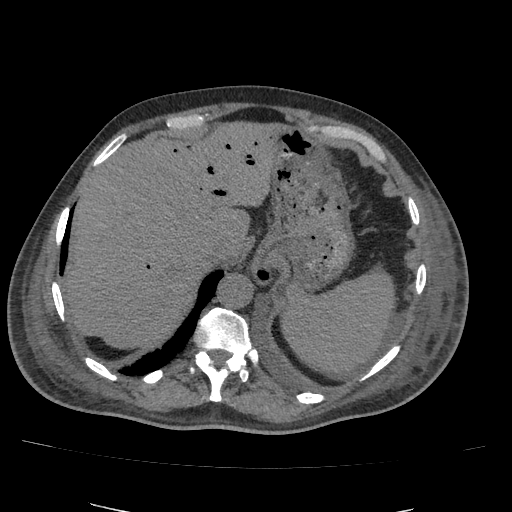
[im 435/500  lung]
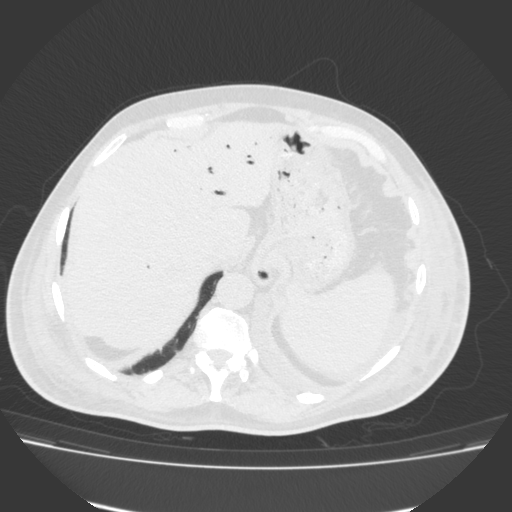
[im 451/500  lung]
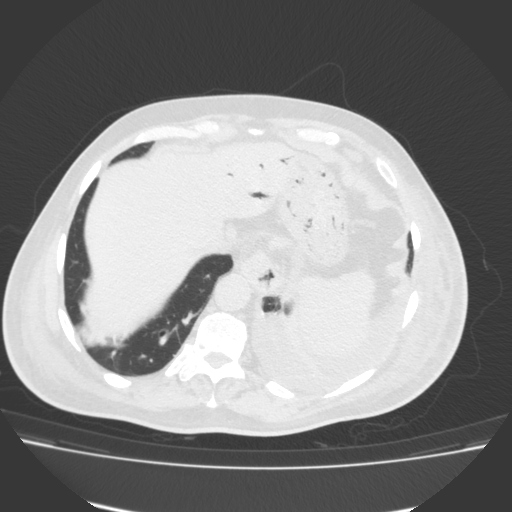
[im 467/500  soft-tissue]
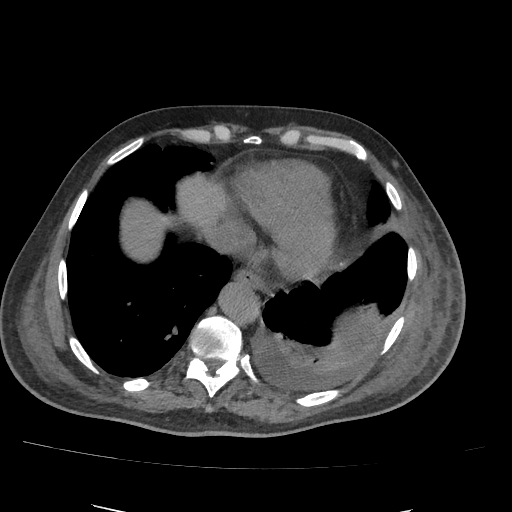
[im 467/500  lung]
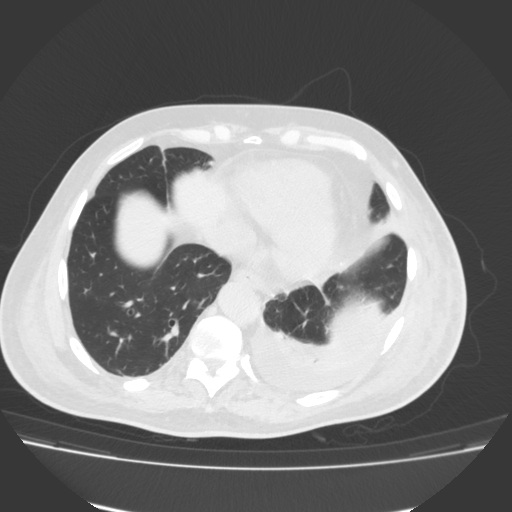
[im 483/500  lung]
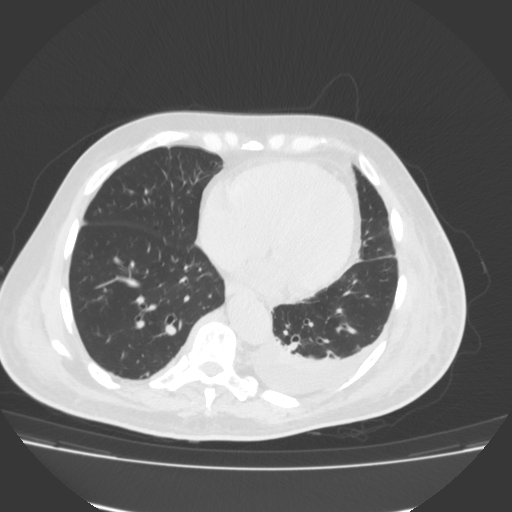

[14 of 32 positions shown; findings below may reference images not displayed]

IMPRESSION: [facility] [address] [place] [id number], [place]
Misael Druzian
Gender: Male
Small left pleural effusion.
Sliding hiatal hernia.
Subcutaneous edema.
Aerobilia and dilation of intra- and extra-hepatic biliary ducts.
Mild hydronephrosis on the right.
Cortical cysts in the left kidney (Bosniak I).
Moderate air distension of the colonic loops, with no signs of obstruction by the method.
Small amount of free perisplenic fluid.
Note: the sensitivity of the study for the detection of disorders of inflammatory/infectious, neoplastic, vascular nature
Lymph node enlargement in the retrocaval, interaortocaval, lateral aortic, and left common iliac chains.
or traumatic is reduced without the use of intravenous contrast.
[facility] [address] [address] [place] [id number], [place]
Misael Druzian


------------- REPORT [id number] -------------
Gender: Male
IMPRESSION: Voluminous prostatic mass with heterogeneous attenuation and signs of extraprostatic extension, invading the bladder, seminal vesicles, and rectum.
Lymphadenomegaly in the left common iliac region.
Edema in the subcutaneous tissue.
Degenerative changes in the lumbar spine.
[facility] [REDACTED[facility] [address] [place] [id number], [place]
Maivet Desing

## 2023-12-17 ENCOUNTER — Ambulatory Visit

## 2023-12-20 IMAGING — CT TC - Abdomen Superior
1 of 3 series · 14 of 32 positions shown, 19 images · non-contrast
Comparison: none

[Series 201: mediastino s/c · axial · U · 0.79mm/px · z∈[+1378,+1982]mm · 14 of 672 slices shown, 19 images]
[im 34/672  soft-tissue]
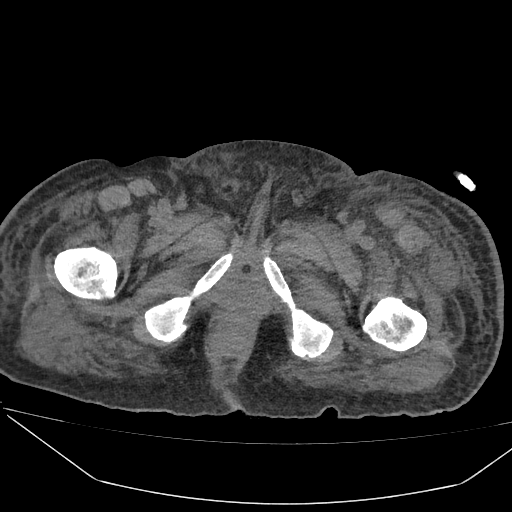
[im 34/672  bone]
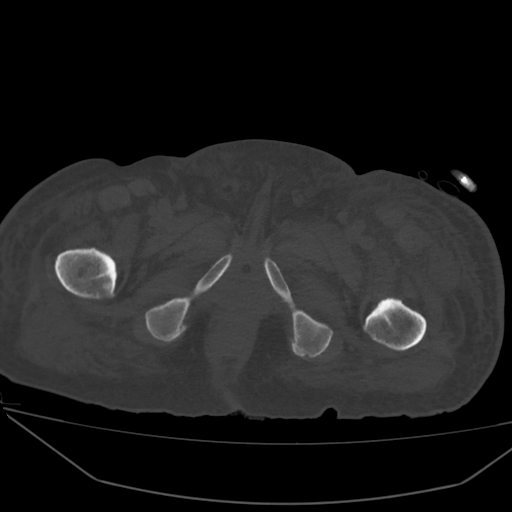
[im 101/672  soft-tissue]
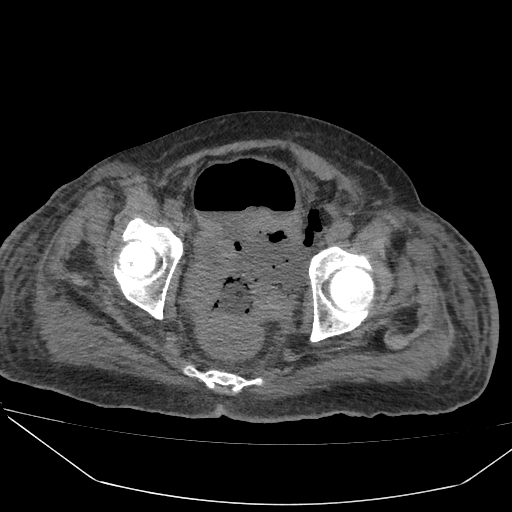
[im 135/672  soft-tissue]
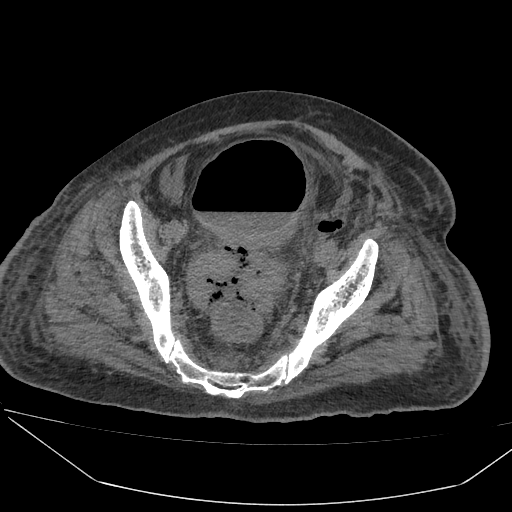
[im 202/672  soft-tissue]
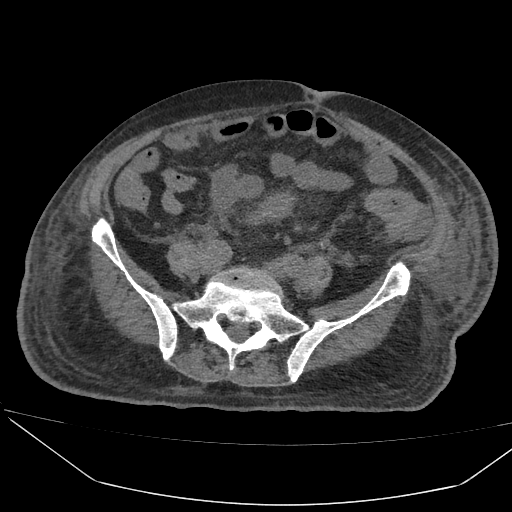
[im 235/672  soft-tissue]
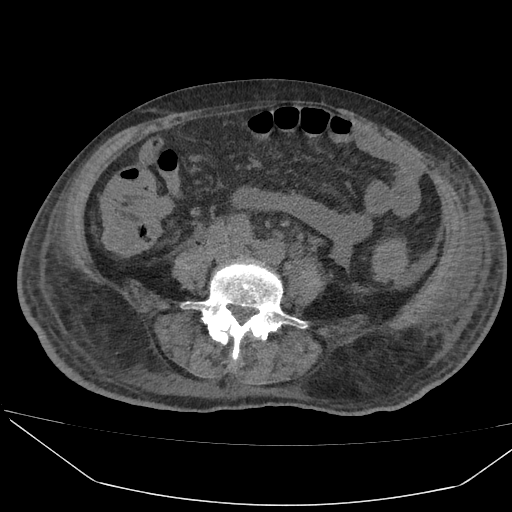
[im 302/672  soft-tissue]
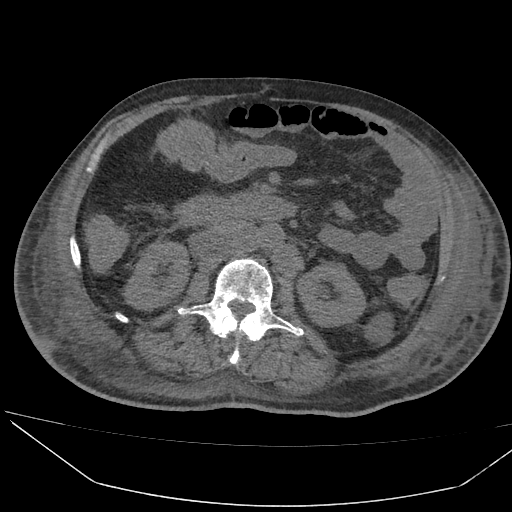
[im 336/672  soft-tissue]
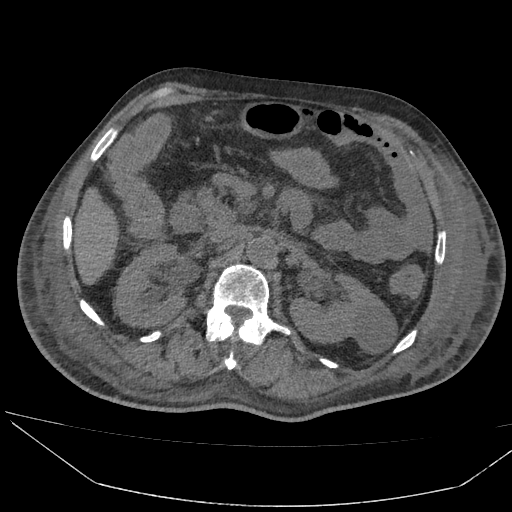
[im 370/672  soft-tissue]
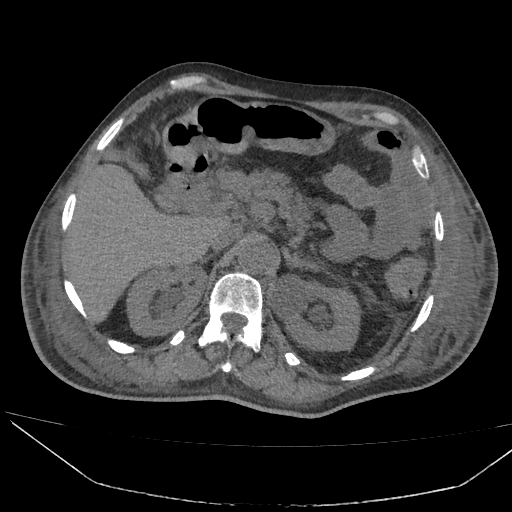
[im 437/672  soft-tissue]
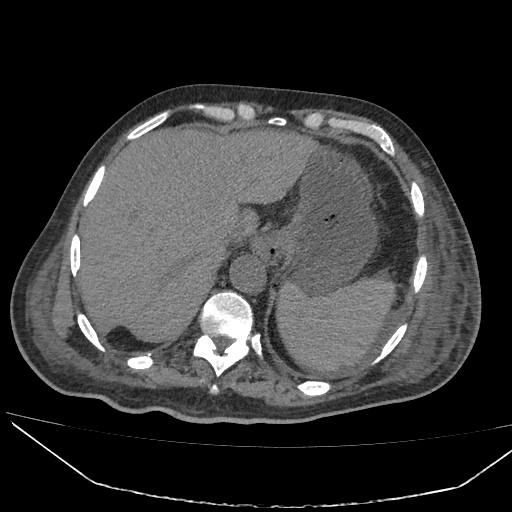
[im 437/672  bone]
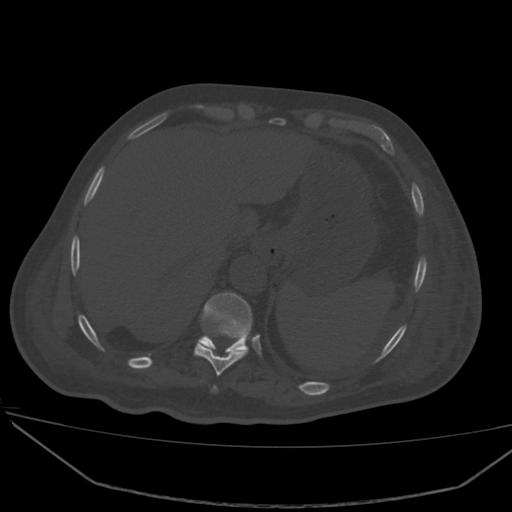
[im 470/672  soft-tissue]
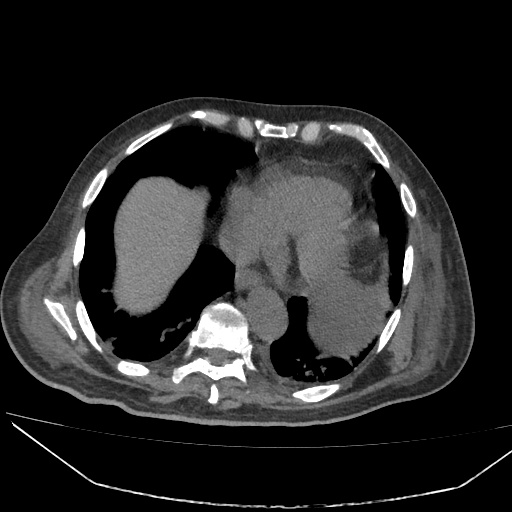
[im 537/672  soft-tissue]
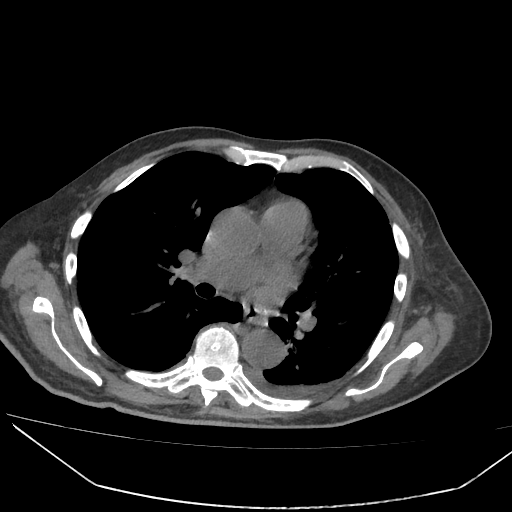
[im 537/672  lung]
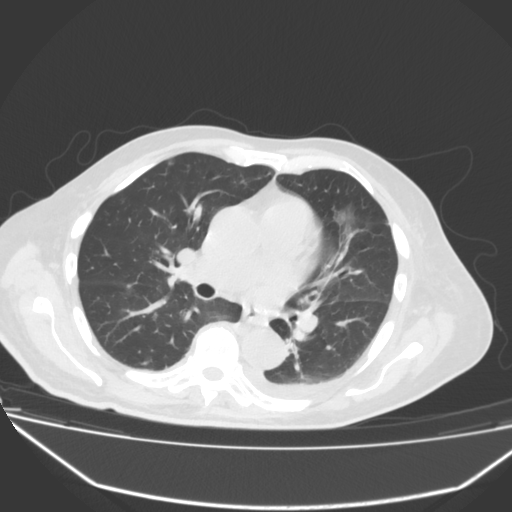
[im 571/672  soft-tissue]
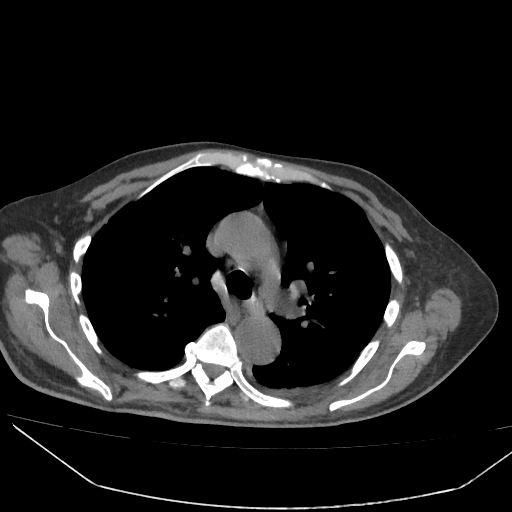
[im 571/672  lung]
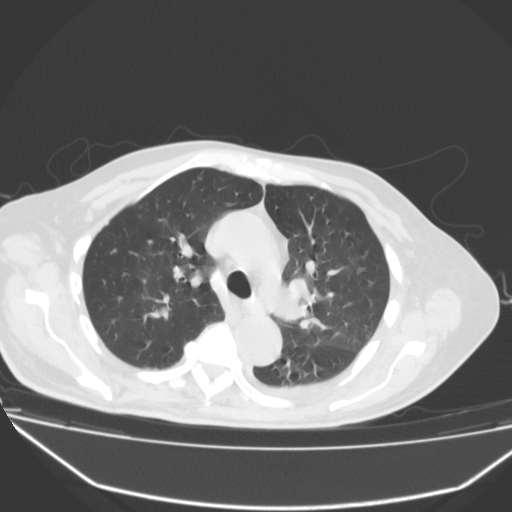
[im 604/672  lung]
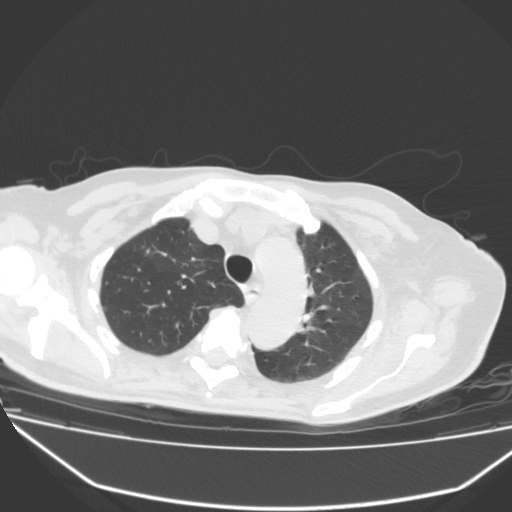
[im 638/672  soft-tissue]
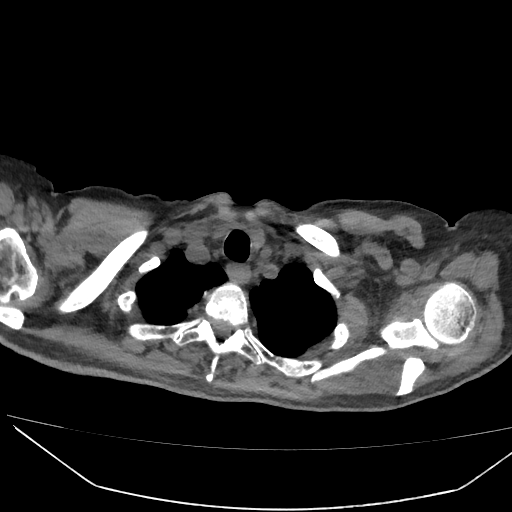
[im 638/672  lung]
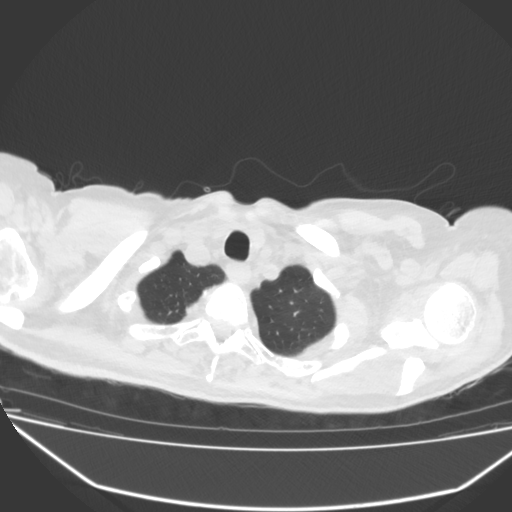

[14 of 32 positions shown; findings below may reference images not displayed]

Gênero:Masculino
Técnica:
Aquisições volumétricas com reconstruções multiplanares.
Relatório:
Derrame pleural laminar à esquerda.
Espessamento  das  paredes  brônquicas,  notadamente  nos  lobos  inferiores,  podendo  corresponder  a  broncopatia
inﬂamatória.
Bandas parenquimatosas e/ou estrias atelectásicas bilaterais de aspecto residual.
TOMOGRAFIA COMPUTADORIZADA DO TÓRAX
Restante  do  parênquima  pulmonar  apresenta  densidade  tomográﬁca  usual,  com  distribuição  vascular  de  aspecto
anatômico.
Traqueia, brônquios principais e lobares pérvios, com dimensões e paredes normais.
Mediastino de conﬁguração anatômica, com tecido gorduroso aparentemente preservado.
Não há sinais de linfonodomegalias axilares, mediastinais ou hilares.
Vasos mediastinais de curso e calibre normais. Ateromatose aórtica e coronariana.
Coração de tamanho normal, sem sinais de aumento de suas cavidades.
Alterações degenerativas na coluna vertebral. Ausência de lesões líticas ou escleróticas suspeitas.
IMPRESSÃO:
Derrame pleural laminar à esquerda.
Espessamento  das  paredes  brônquicas,  notadamente  nos  lobos  inferiores,  podendo  corresponder  a  broncopatia
inﬂamatória.
Bandas parenquimatosas e/ou estrias atelectásicas bilaterais de aspecto residual.
Ateromatose aórtica e coronariana.
Alterações degenerativas na coluna vertebral.
[facility] [address] [place] [id number], [place]

## 2023-12-21 IMAGING — CT TC - Pelve / Bacia / Abdomen Inferior
1 of 3 series · 9 of 32 positions shown, 15 images · non-contrast
Comparison: none

[Series 301: pos contraste svd · axial · U · 0.72mm/px · z∈[+1291,+1514]mm · 9 of 279 slices shown, 15 images]
[im 28/279  soft-tissue]
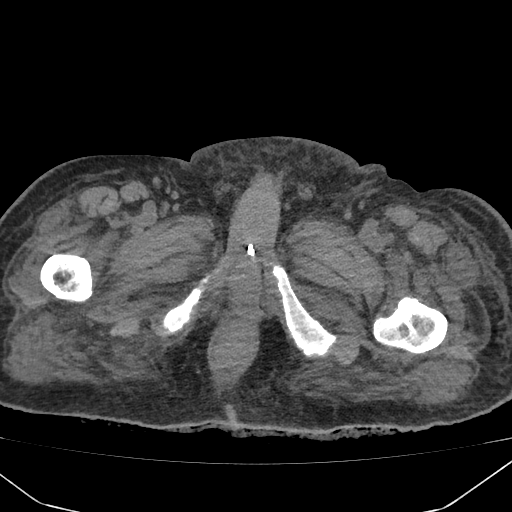
[im 28/279  bone]
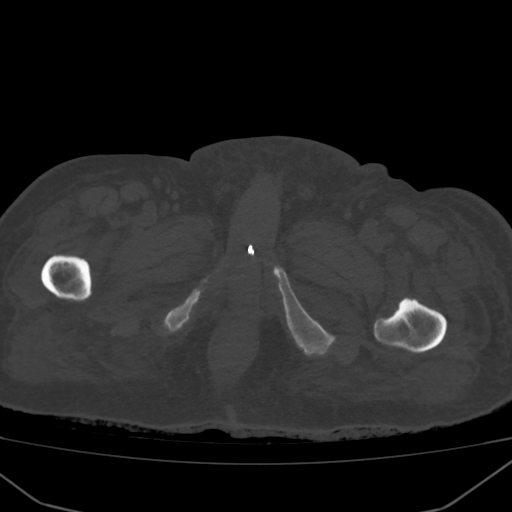
[im 56/279  soft-tissue]
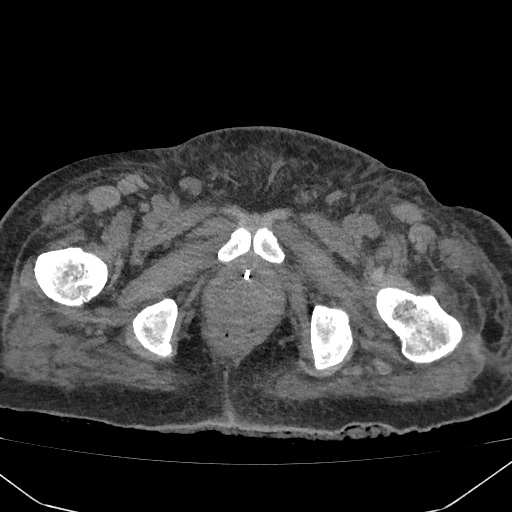
[im 84/279  soft-tissue]
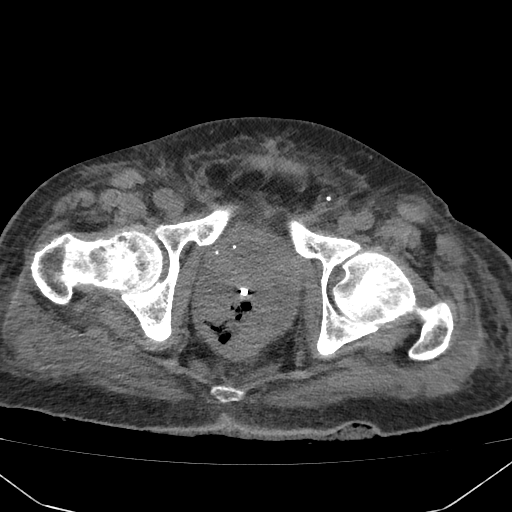
[im 112/279  soft-tissue]
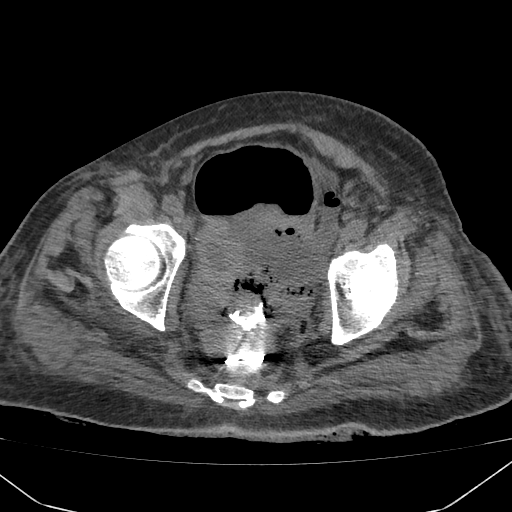
[im 140/279  soft-tissue]
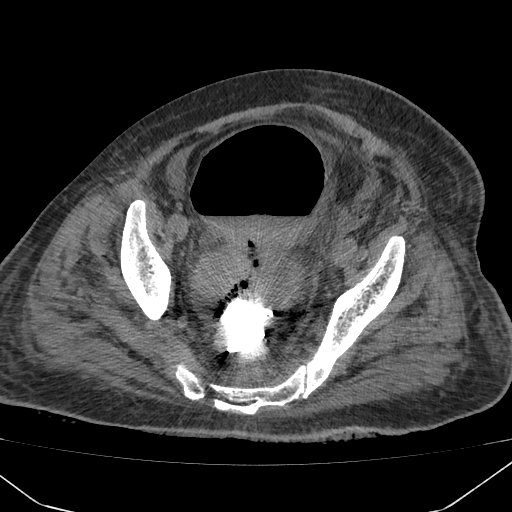
[im 167/279  soft-tissue]
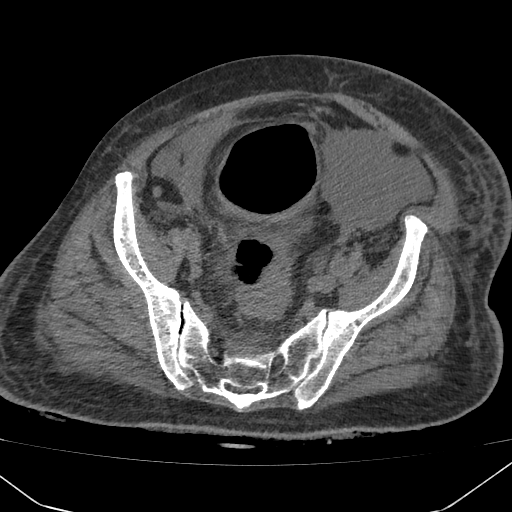
[im 167/279  lung]
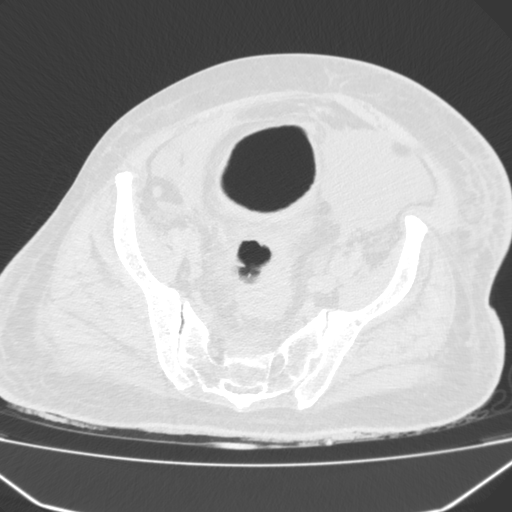
[im 195/279  soft-tissue]
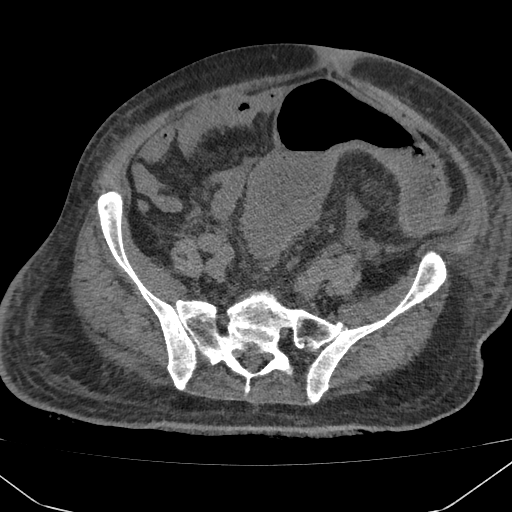
[im 195/279  lung]
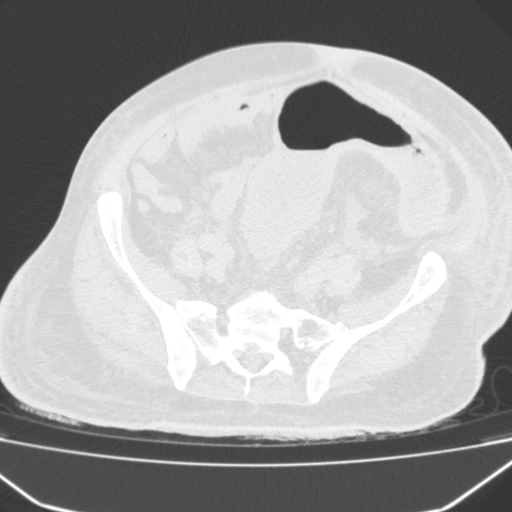
[im 223/279  soft-tissue]
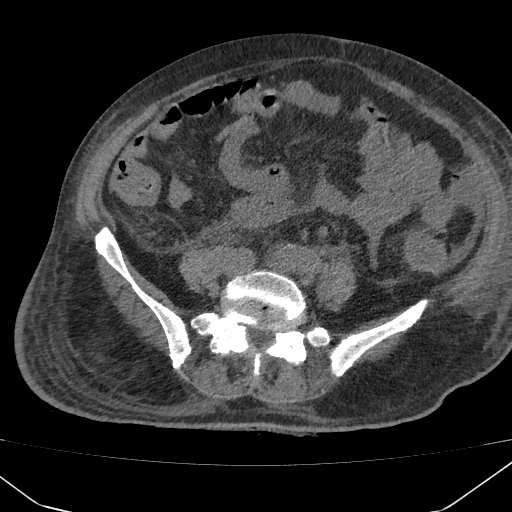
[im 223/279  lung]
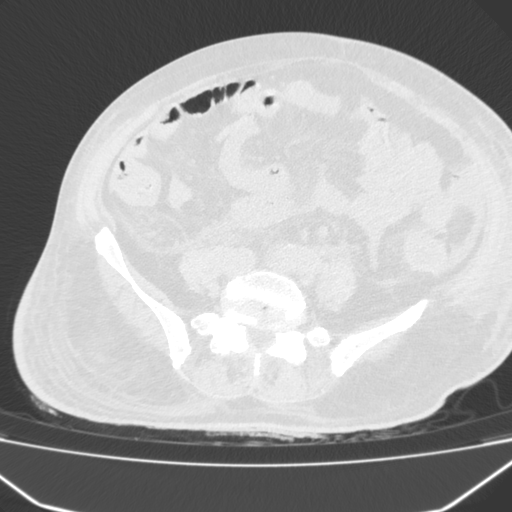
[im 251/279  soft-tissue]
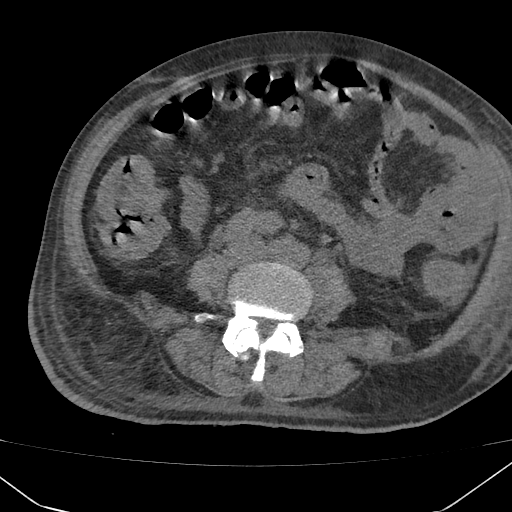
[im 251/279  lung]
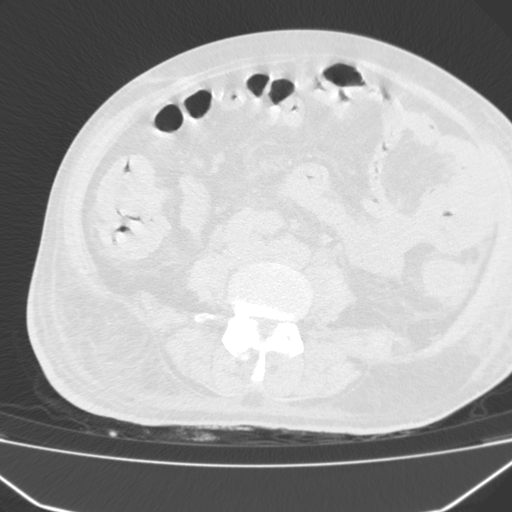
[im 251/279  bone]
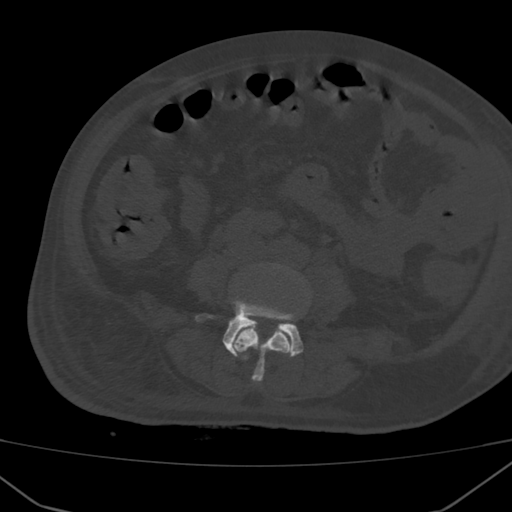

[9 of 32 positions shown; findings below may reference images not displayed]

Gênero:Masculino
TÉCNICA DE EXAME:
Exame realizado em aparelho multidetectores, com cortes axiais e reformatações multiplanares, sem administração de
contraste iodado endovenoso sistêmico. Administrado meio de contraste via sonda vesical.
DADOS CLÍNICOS:
Avaliação de posicionamento da sonda vesical.
ANÁLISE DOS ACHADOS:
Sonda vesical com extremidade distal localizada em topograﬁa pélvica posterior em íntimo contato com o reto.
Após administração de contraste via sonda, observa-se extravasamento para cavidade peritoneal e para o reto,
compatível com fístula vesicoperitoneal e vesicorretal.
Planos músculo-gordurosos pélvicos com densiﬁcação difusa, associados a pequena quantidade de líquido livre.
Lesão inﬁltrativa heterogênea e mal delimitada acometendo região pélvica direita, provavelmente relacionada à
TOMOGRAFIA COMPUTADORIZADA DA PELVE
próstata.
Bexiga com conteúdo gasoso intraluminal.
Estruturas ósseas da pelve com densidade preservada.
IMPRESSÃO DIAGNÓSTICA:
Posicionamento inadequado da sonda vesical, com extravasamento de contraste para cavidade peritoneal e reto,
conﬁgurando fístula vesicoperitoneal e vesicorretal.
Lesão inﬁltrativa pélvica direita, provavelmente prostática, de caráter indeterminado nesta avaliação.
Presença de gás intravesical, sugestivo de comunicação anômala ou manipulação prévia.
[facility] - [address] [place] [id number], [place]

## 2023-12-22 ENCOUNTER — Encounter: Payer: Self-pay | Admitting: Optometry

## 2023-12-22 ENCOUNTER — Ambulatory Visit: Attending: Optometry | Admitting: Optometry

## 2023-12-22 ENCOUNTER — Other Ambulatory Visit: Payer: Self-pay

## 2023-12-22 DIAGNOSIS — E119 Type 2 diabetes mellitus without complications: Secondary | ICD-10-CM | POA: Insufficient documentation

## 2023-12-22 DIAGNOSIS — Z961 Presence of intraocular lens: Secondary | ICD-10-CM | POA: Insufficient documentation

## 2023-12-22 DIAGNOSIS — T1512XA Foreign body in conjunctival sac, left eye, initial encounter: Secondary | ICD-10-CM | POA: Insufficient documentation

## 2023-12-22 DIAGNOSIS — H35353 Cystoid macular degeneration, bilateral: Secondary | ICD-10-CM | POA: Insufficient documentation

## 2023-12-22 DIAGNOSIS — H04123 Dry eye syndrome of bilateral lacrimal glands: Secondary | ICD-10-CM | POA: Insufficient documentation

## 2023-12-22 DIAGNOSIS — H40003 Preglaucoma, unspecified, bilateral: Secondary | ICD-10-CM | POA: Insufficient documentation

## 2023-12-22 DIAGNOSIS — H43813 Vitreous degeneration, bilateral: Secondary | ICD-10-CM | POA: Insufficient documentation

## 2023-12-22 DIAGNOSIS — H524 Presbyopia: Secondary | ICD-10-CM | POA: Insufficient documentation

## 2023-12-22 NOTE — Progress Notes (Signed)
 Outpatient VisitPatient name: Tristan CruzDOB: [birth year]       Age: 78 y.o.MR#: [id number] Encounter Date: 9/10/2025Subjective:  Chief Complaint Patient presents with  Follow-up  CME (cystoid macular edema), bilateral   DFE with MAC and RNFL OCT HPI   CME (cystoid macular edema), bilateral   Additional comments: DFE with MAC and RNFL OCT   Comments  Tristan Mcbride is a 78 y.o. male here for 3 month follow up to monitor history of CME (cystoid macular edema), bilateral,Vitreous degeneration of both eyes,Presence of intraocular lens,Dry eye syndrome of both eyes, Presbyopia, Glaucoma suspect of both eyes and Type 2 diabetes mellitus without retinopathy. Patient is here with spanish interpreter. Patient states he has an occasional aching sensation in left eye which comes and goes. He notices it right now. Also has a foreign body sensation like a hair is in left eye almost all the time. Some tearing. No itchiness. No headaches. Occasional floaters peripherally in both eyes but more in left eye stable since after cataract surgery. This has been happening since after cataract surgery. No double vision. Some photophobia. He got glasses made after last visit. He reports blurry vision as well.BG: doesn't checkHemoglobin A1C (%)     Date                     Value               Status              09/22/2023               6.0 (H)             Final          ----------Hemoglobin A1C,POC (%)     Date                     Value               Status              01/15/2021               6.1 (H)             Final          ---------- Ocular medications: None    Last edited by Freida Delon Farr, OD on 12/22/2023  2:56 PM.  has a current medication list which includes the following prescription(s): tamsulosin , metformin , lisinopril , atorvastatin , acetaminophen , tadalafil , blood pressure monitor, automatic with arm cuff,  metamucil smooth texture, and [DISCONTINUED] insulin  glargine. is allergic to tetanus toxoids.  Past Medical History: Diagnosis Date  BPH (benign prostatic hypertrophy)   DM (diabetes mellitus) 10/13/2017  Esophageal reflux   Fibrolipoma   intramuscular adipose tissue  Hyperlipidemia   Hypertension   Hypoglycemia   Kidney cyst   Rotator cuff tendinitis   Right  Sexual dysfunction   absent ejaculation  Type 2 diabetes mellitus   Weight loss   Past Surgical History: Procedure Laterality Date  KNEE SURGERY    left  LEG TENDON SURGERY    Right  PR XCAPSL CTRC RMVL INSJ IO LENS PROSTH W/O ECP Left 04/21/2023  Procedure: PHACO W/ IOL; CAPSULE STAIN; +/- IRIS HOOKS; +/- OMIDRIA;  Surgeon: Melonie Pugh, MD;  Location: [facility];  Service: Ophthalmology  PR XCAPSL CTRC RMVL INSJ IO LENS PROSTH W/O ECP Right 05/05/2023  Procedure: PHACO W/ IOL; CAPSULE STAIN;  + OMIDRIA;  Surgeon: Melonie Pugh,  MD;  Location: [facility];  Service: Ophthalmology  Specialty Problems    Ophthalmology Problems  DM (diabetes mellitus)    ROS  Positive for: Endocrine, EyesNegative for: Constitutional, Gastrointestinal, Neurological, Skin, Genitourinary, Musculoskeletal, HENT, Cardiovascular, Respiratory, Psychiatric, Allergic/Imm, Heme/LymphLast edited by Cristine Eleanor RAMAN, COA on 12/22/2023  2:06 PM.   Objective: Base Eye Exam   Visual Acuity (Numbers - Linear)     Right Left  Dist cc 20/30 -2 20/30 -2  Near sc J3 OU   Correction: Glasses Patient forgot his OTC glasses   Tonometry (Tonopen, 2:24 PM)     Right Left  Pressure 14 16    Pupils     Dark Light Shape React APD  Right 5 4 Round Brisk None  Left 5 4 Round Brisk None    Visual Fields     Left Right   Full Full    Extraocular Movement     Right Left   Full Full    Neuro/Psych    Oriented x3: Yes  Mood/Affect: Normal    Dilation   Both eyes: 2.5% Phenylephrine , 1.0% Tropicamide , 0.5% Proparacaine @ 2:25 PM    Slit Lamp and Fundus Exam   External Exam     Right Left  External Normal ocular adnexae, lacrimal gland & drainage, orbits Normal ocular adnexae, lacrimal gland & drainage, orbits    Slit Lamp Exam     Right Left  Lids/Lashes 1+ Meibomian gland dysfunction 1+ Meibomian gland dysfunction  Conjunctiva/Sclera Normal bulbar/palpebral, conjunctiva, sclera conjunctivochalasis, inferior fornix had eyelash, removed in office  Cornea Reduced tear film break up time, TCCI Reduced tear film break up time, TCCI, Punctate epithelial erosions nasally inferiorly  Anterior Chamber Clear & deep, 1 cell total Clear & deep  Iris Normal shape, size, morphology Normal shape, size, morphology  Lens Posterior chamber intraocular lens Posterior chamber intraocular lens  Anterior Vitreous Posterior vitreous detachment Posterior vitreous detachment more prominent in the left eye than right    Fundus Exam     Right Left  Disc Normal size, appearance, nerve fiber layer Normal size, appearance, nerve fiber layer, triangular shaped cup  C/D Ratio 0.7 0.7  Macula trace ERM central, resolved CME trace sup ERM, resolved CME  Vessels Normal (-) DR OU Normal  Periphery Normal, few peripheral floaters Normal, peripheral floaters    Refraction   Wearing Rx     Sphere Cylinder Axis  Right +0.50 -1.00 062  Left -0.25 -0.50 058  Age: 89m  Type: SVD Has reading ones at home   Assessment/Plan:  1. Dry eye syndrome of both eyes    2. Foreign body of conjunctiva, left, initial encounter    3. PVD (posterior vitreous detachment), both eyes  OCT, mac-OU  OCT, RNFL-OU  OCT, mac-OU  OCT, RNFL-OU  4. CME (cystoid macular edema), bilateral Inactive   5. Glaucoma  suspect of both eyes  OCT, RNFL-OU  6. Presence of intraocular lens    7. Type 2 diabetes mellitus without retinopathy    8. Presbyopia     PLAN:1-2. Patient educated on findings today, his symptoms of aching, foreign body sensation and tearing which is worse in the left eye is consistent with a foreign body he had in the left eye (an eyelash deep in the inferior fornix which I removed today in office), but also could be consistent with SPK and dry eye syndrome which was worse on the left side than the right eye. The eyelash could have caused this to  worsen but it also could just be worse in general in the left eye. Educated on the following treatment plan:-Patient educated to use artificial tears such as Ivizia, blink, Refresh Relieva, Blink triple care, Systane Hydration, Systane Complete, Biotrue hydration boost. I recommend these are used in the effected eye, but can be used in both eyes as needed up to four times per day. If using >4x/day recommend preservative free artificial tears.-Patient educated on warm compresses with a warm washcloth or bruder mask on both eyelids 1x/day for 10 minutes-Call or return to clinic as needed if notices changes in vision, loss of vision, new ocular symptoms or worsening of symptoms.3-4. Patient here for follow up with a history of CME, which is now resolved.  IOP is normal, and vision is stable to last time, dryness could be why it is not 20/20. I personally ordered and reviewed the OCT today which showed:Trace ERM without any CME anymore, retina is flat. Vision is improved.  -doing well with SVL for distance and nearEducated on the following treatment plan:No need to restart steroid drops, or ketorolac - CME has resolved. Rx management- recommend not restarted, removed from med listDilated examination shows no retinal detachment. He has stable floatersEducated on the following treatment plan:-If you experience flashing lights  (like lightning bolts that last for only a second), new/worsening floating spots (like cobwebs that move/float as you move your eyes) a curtain coming down on your vision, or complete loss of vision, these could be signs/symptoms of a retinal detachment therefore it is important to call and schedule an appointment ASAP-Call or return to clinic as needed if notices changes in vision, loss of vision, new ocular symptoms or worsening of symptoms.Follow up in 12 months for dilated examination and OCT MAC OU and RNFL OU- CALL ASAP Sooner with changes/worsening vision5. Glaucoma suspect vs. Physiologic cupping OU, ?progression comparing 2008 photos and 2019 photos, although rim tissue appears healthy today and is stable to 2022 and previous dilated exam therefore is likely not progressing at this time.- IOP:  Normal IOP todayI personally ordered and reviewed the OCT today which showed:OD: normal average rnfl thickness at 94 um, no signs of damage, green all quadrants, stable to previous scanOS: normal average rnfl thickness at 85 um, no signs of damage, green all quadrants, stable to previous scanLast OCT I personally reviewed showed:OD: normal average rnfl thickness at 84 um, no signs of damage, green all quadrants, stable to previous scan (slightly worse but more  consistent with cataracts)OS: normal average rnfl thickness at 80 um, no signs of damage, green all quadrants, stable to previous scan- last HVF from 2012 appeared normal-pachymetry is normal- Follow in 1 year with annual examination with OCT RNFL OU6. PCIOL is clear and well centered. There is no inflammation in both eyes. 7. The patient is without signs of diabetic retinopathy or diabetic macular edema. The patient was counseled to report any visual changes, to regularly check serum glucose levels as directed, to optimize diet, to take prescribed medications, to keep scheduled appointments, and to optimize blood glucose  control with HgA1C less than 7 or as primary physician or endocrinologist has specified.  Communication will be forwarded to the responsible physician managing the patient's diabetes.  Follow up has been arranged for 1 year. 8. Continue current glasses.RTC 12 month for follow up with annual dilated examination/OCT MAC OU/OCT RNFL OU or sooner as needed

## 2023-12-22 NOTE — Patient Instructions (Addendum)
-  Patient educated to use artificial tears such as Ivizia, blink, Refresh Relieva, Blink triple care, Systane Hydration,  Systane Complete, Biotrue hydration boost. I recommend these are used in the effected eye, but can be used in both eyes as needed up to four times per day. If using >4x/day recommend preservative free artificial tears.-Patient educated on warm compresses with a warm washcloth or bruder mask on both eyelids 1x/day for 10 minutes-Call or return to clinic as needed if notices changes in vision, loss of vision, new ocular symptoms or worsening of symptoms.-If you experience flashing lights (like lightning bolts that last for only a second), new/worsening floating spots (like cobwebs that move/float as you move your eyes) a curtain coming down on your vision, or complete loss of vision, these could be signs/symptoms of a retinal detachment therefore it is important to call and schedule an appointment ASAP

## 2023-12-24 ENCOUNTER — Other Ambulatory Visit: Payer: Self-pay

## 2023-12-24 ENCOUNTER — Ambulatory Visit: Attending: Internal Medicine

## 2023-12-24 ENCOUNTER — Other Ambulatory Visit
Admission: RE | Admit: 2023-12-24 | Discharge: 2023-12-24 | Disposition: A | Discharge status: Home / Self Care | Source: Ambulatory Visit | Attending: Internal Medicine | Admitting: Internal Medicine

## 2023-12-24 DIAGNOSIS — M25511 Pain in right shoulder: Secondary | ICD-10-CM

## 2023-12-24 DIAGNOSIS — Z23 Encounter for immunization: Secondary | ICD-10-CM

## 2023-12-24 DIAGNOSIS — Z13220 Encounter for screening for lipoid disorders: Secondary | ICD-10-CM

## 2023-12-24 DIAGNOSIS — N1831 Chronic kidney disease, stage 3a: Secondary | ICD-10-CM | POA: Insufficient documentation

## 2023-12-24 DIAGNOSIS — E785 Hyperlipidemia, unspecified: Secondary | ICD-10-CM

## 2023-12-24 LAB — LIPID PANEL
Chol/HDL Ratio: 3.2
Cholesterol: 124 mg/dL
HDL: 39 mg/dL — ABNORMAL LOW (ref 40–60)
LDL Calculated: 72 mg/dL
Non HDL Cholesterol: 85 mg/dL
Triglycerides: 61 mg/dL

## 2023-12-24 LAB — COMPREHENSIVE METABOLIC PANEL
ALT: 18 U/L (ref 0–50)
AST: 16 U/L (ref 0–50)
Albumin: 4.1 g/dL (ref 3.5–5.2)
Alk Phos: 87 U/L (ref 40–130)
Anion Gap: 10 (ref 7–16)
Bilirubin,Total: 0.4 mg/dL (ref 0.0–1.2)
CO2: 24 mmol/L (ref 20–28)
Calcium: 10.6 mg/dL — ABNORMAL HIGH (ref 8.6–10.2)
Chloride: 106 mmol/L (ref 96–108)
Creatinine: 1.41 mg/dL — ABNORMAL HIGH (ref 0.67–1.17)
Glucose: 101 mg/dL — ABNORMAL HIGH (ref 60–99)
Lab: 22 mg/dL — ABNORMAL HIGH (ref 6–20)
Potassium: 4.3 mmol/L (ref 3.3–5.1)
Sodium: 140 mmol/L (ref 133–145)
Total Protein: 7 g/dL (ref 6.3–7.7)
eGFR BY CREAT: 51 — AB

## 2023-12-24 LAB — CBC AND DIFFERENTIAL
Baso # K/uL: 0.1 THOU/uL (ref 0.0–0.2)
Eos # K/uL: 0.3 THOU/uL (ref 0.0–0.5)
Hematocrit: 41 % (ref 37–52)
Hemoglobin: 12.8 g/dL (ref 12.0–17.0)
IMM Granulocytes #: 0.1 THOU/uL — ABNORMAL HIGH
IMM Granulocytes: 0.5 %
Lymph # K/uL: 3.6 THOU/uL (ref 1.0–5.0)
MCV: 87 fL (ref 75–100)
Mono # K/uL: 0.9 THOU/uL (ref 0.1–1.0)
Neut # K/uL: 5.6 THOU/uL (ref 1.5–6.5)
Nucl RBC # K/uL: 0 THOU/uL (ref 0.0–0.1)
Nucl RBC %: 0 /100{WBCs} (ref 0.0–0.2)
Platelets: 246 THOU/uL (ref 150–450)
RBC: 4.6 MIL/uL (ref 4.0–6.0)
RDW: 13.9 % (ref 0.0–15.0)
Seg Neut %: 53.9 %
WBC: 10.4 THOU/uL (ref 3.5–11.0)

## 2023-12-24 NOTE — Progress Notes (Signed)
 Tristan Mcbride is in clinic today for high dose flu vaccine. Reviewed immunization and side effects. Patient provided verbal consent for administration. Administered in LD. Patient tolerated well. See immunization record for additional information. Next dose scheduled for  . [name] O Vitali Seibert, LPN9/12/20259:50 AM

## 2023-12-24 NOTE — Telephone Encounter (Signed)
 Refill request routed to Dr. Norleen 12/24/2023 11:11 AM

## 2023-12-24 NOTE — Progress Notes (Signed)
 [facility] Office Note:Subjective Patient examined with wife in the room.  Used tablet Spanish interpreter.Patient says he is doing well.  He does not have any acute concerns medically other than occasional right shoulder pain which has previously been thought to be due to right shoulder tendinitis.  He says his shoulder is not hurting today at the time of his exam.  He reports that he had a corticosteroid injection to his right shoulder which was successful in relieving pain for period of time.  He denies ever trying physical therapy.  He denies any history of trauma or acute injury to his right shoulder.He says he has not had any difficulty with concentration, any other musculoskeletal pain, no pain with urination, no palpitations.  He has 2 bowel movements per day and feels he empties his bowels appropriately.  However, he will occasionally feel he needs to have a bowel movement and will not evacuate.He says he forgot to go to his appointment with colorectal surgery for colonoscopy.  He also has not made appointments with endocrinology for hypercalcemia or completed DEXA scan as previously ordered.  He says he is amenable to doing each of these items when reminded today.Medications & Allergies Current Outpatient Medications Medication Sig  tamsulosin  (FLOMAX ) 0.4 mg capsule Take 1 capsule (0.4 mg total) by mouth every evening.  metFORMIN  (GLUCOPHAGE ) 500 mg tablet Take 1 tablet (500 mg total) by mouth 2 times daily (with meals).  lisinopril  (PRINIVIL ,ZESTRIL ) 20 mg tablet Take 1 tablet (20 mg total) by mouth daily.  atorvastatin  (LIPITOR) 20 mg tablet TAKE 1 TABLET(20 MG) BY MOUTH DAILY WITH DINNER  acetaminophen  (TYLENOL ) 500 mg tablet 1 tablet as needed Orally every 6 hrs for 5 days  tadalafil  (CIALIS ) 5 MG tablet Take 1 tablet (5 mg total) by mouth daily.  blood pressure monitor, automatic with arm cuff Use as directed to monitor blood pressure   psyllium (METAMUCIL SMOOTH TEXTURE) 28.3 % POWD powder 1 tablespoon in 8 oz of water  daily by mouth.    He is allergic to tetanus toxoids.Past Medical & Surgical History Past Medical History: Diagnosis Date  BPH (benign prostatic hypertrophy)   DM (diabetes mellitus) 10/13/2017  Esophageal reflux   Fibrolipoma   intramuscular adipose tissue  Hyperlipidemia   Hypertension   Hypoglycemia   Kidney cyst   Rotator cuff tendinitis   Right  Sexual dysfunction   absent ejaculation  Type 2 diabetes mellitus   Weight loss  Past Surgical History: Procedure Laterality Date  KNEE SURGERY    left  LEG TENDON SURGERY    Right  PR XCAPSL CTRC RMVL INSJ IO LENS PROSTH W/O ECP Left 04/21/2023  Procedure: PHACO W/ IOL; CAPSULE STAIN; +/- IRIS HOOKS; +/- OMIDRIA;  Surgeon: Melonie Pugh, MD;  Location: [facility];  Service: Ophthalmology  PR XCAPSL CTRC RMVL INSJ IO LENS PROSTH W/O ECP Right 05/05/2023  Procedure: PHACO W/ IOL; CAPSULE STAIN;  + OMIDRIA;  Surgeon: Melonie Pugh, MD;  Location: [facility];  Service: Ophthalmology Social & Family History Family History Problem Relation Age of Onset  Heart Disease Mother   Diabetes Mother   Diabetes Father   Stroke Father   Cerebral Palsy Father   Breast cancer Maternal Aunt   Colon cancer Neg Hx   Esophageal cancer Neg Hx   Liver cancer Neg Hx   Pancreatic Cancer Neg Hx   Rectal cancer Neg Hx   Stomach cancer Neg Hx  Social History[1]Vitals & Physical Exam Current Vitals Vitals Range (24 Hours)  BP 134/74   Pulse 70   Temp 36.3 C (97.4 F) (Temporal)   Ht 1.702 m (5' 7)   Wt 69.9 kg (154 lb 3.2 oz)   SpO2 98%   BMI 24.15 kg/m  Temp:  [36.3 C (97.4 F)] 36.3 C (97.4 F)Heart Rate:  [70] 70BP: (134)/(74) 134/74 Physical Exam:General: Patient sitting comfortably in chair. Calm, cooperative, in no acute distress.Cardiovascular: RRR, no m/r/g,  normal S1 and S2.Pulmonary: NWOB, CTAB.Abdominal: Soft, NTND, +ve BS.Neuro: Alert and oriented x3. Speech is fluent, comprehension is intact. Extremities: Warm, dry, no edema. Scars over bilateral kneesDiabetic Foot Exam: Pulses intact. Sensation intact to monofilament. No ulcers. Proprioception intact.  Assessment & Plan This patient's hypercalcemia may be multifactorial based on pulmonary lab findings.  May have primary hyperparathyroidism given his inappropriately normal PTH.  However, PTH related protein is mildly elevated and coexisting paraneoplastic process cannot be excluded. May benefit from nuclear medicine scan of parathyroid  to rule out adenoma but will defer to endocinology regarding further evaluation and work up. Left lung nodule has been stable on repeat CT scans. Other causes such as multiple myeloma, familial hypocalciuric hypercalcemia, granulomatous disease are less likely based on labs. 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 Osteoarthritis-PT referral today#Type 2 Diabetes mellitus complicated by microalbuminuria-Diabetic foot exam completed today# Colorectal cancer screeningProvided phone number to reschedule colonoscopy oday Not discussed today:#Benign Prostatic  Hyperplasia -Continue with tamsulosin  0.4 mg nightly & Cialis  5 mg daily PRN.-Follows with Urology #Chronic Kidney Disease, Stage 3A-Follow CMPHealth Maintenance Abdominal Aortic Aneurysm (one-time screening in men 65-75 with history of tobacco use) - n/aBreast Cancer (biennial screening in women 40-74) - n/a             Cervical Cancer (Pap smear every 3 years in women 21-65) - n/aColorectal Cancer (colonoscopy every 10 years in adults 45-75) - last in 2016, due for colonoscopy in 2021, previously ordered, pendingDiabetes (annual A1c in overweight or adults >45) - 6.0 in June 2025Immunizations - Flu today, shingles and TDAP at next visitLipid Disorder (annual screening in adults with increased CHD risk) - Feb 2025Lung Cancer (annual screening in adults 22-80 with 20 pack-year history and active smoking within 15 years) - n/aOsteoporosis (one-time screening in women >31) - n/aOrders Placed Orders Placed This Encounter  Influenza Trivalent ADJ PF 57yr+  Comprehensive metabolic panel  Lipid Panel (Reflex to Direct  LDL if Triglycerides more than 400)  CBC and differential  AMB REFERRAL TO PHYSICAL / OCCUPATIONAL THERAPY - [facility]  [facility] DEXA SCAN Lillia Rush, MD Internal Medicine PGY-2Signed 12/24/2023 at 5:47 PM  [1] Social HistorySocioeconomic History  Marital status: Married Tobacco Use  Smoking status: Never  Smokeless tobacco: Never Vaping Use  Vaping status: Never Used Substance and Sexual Activity  Alcohol  use: No  Drug use: No  Sexual activity: Yes   Partners: Female

## 2023-12-24 NOTE — Patient Instructions (Addendum)
 Por favor, hgase un anlisis de sangre hoy antes de salir del edificio.Llame para programar una densitometra sea (DEXA) para comprobar su salud sea: [phone] a este nmero para programar una cita con endocrinologa lo antes posible: [phone] al [phone] para reprogramar su colonoscopia para deteccin de cncer de colon.Comience la fisioterapia para su hombro derecho.___________________________________________Please get blood work today before leaving the building.Call to schedule the DEXA scan to check your bone health: [phone]  Please call this phone number to schedule an appointment with endocrinology as soon as possible: [phone]  Please call [phone] to reschedule your colonoscopy for colon cancer screening.Please start physical therapy for your right shoulder.

## 2023-12-25 IMAGING — CT TC - Abdomen Superior
1 series · 14 of 32 positions shown, 18 images · non-contrast
Comparison: none

[Series 201: s/c · axial · U · 0.94mm/px · z∈[+1411,+1866]mm · 14 of 505 slices shown, 18 images]
[im 33/505  soft-tissue]
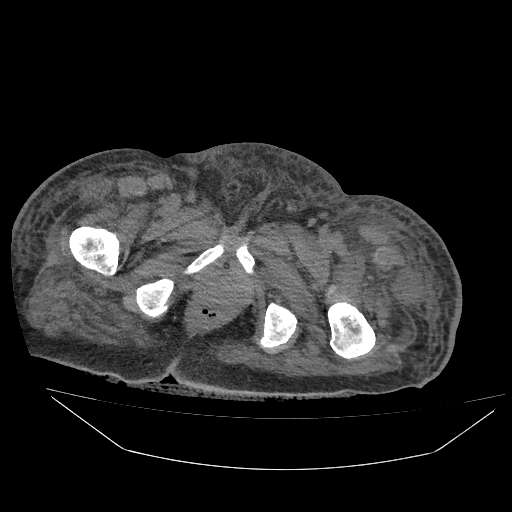
[im 33/505  bone]
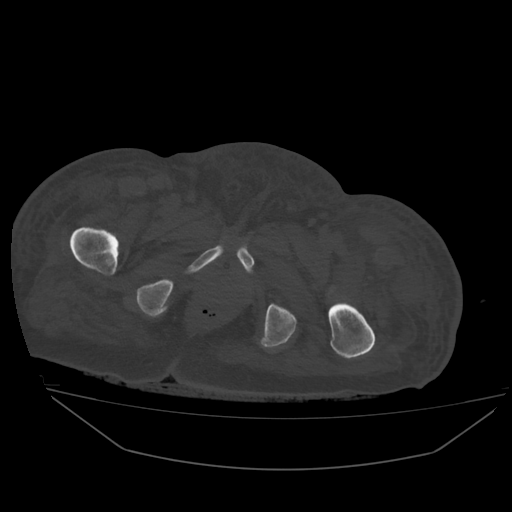
[im 66/505  soft-tissue]
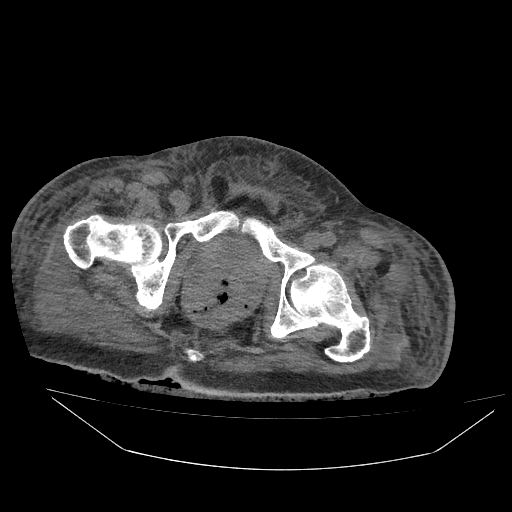
[im 114/505  soft-tissue]
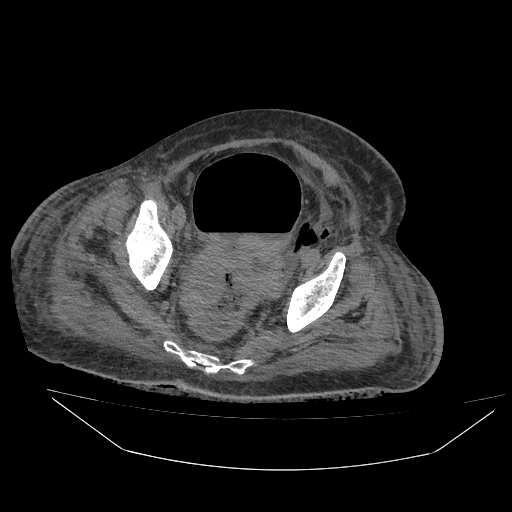
[im 147/505  soft-tissue]
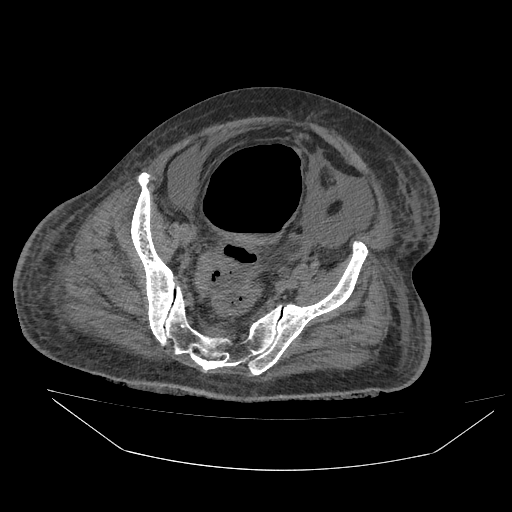
[im 196/505  soft-tissue]
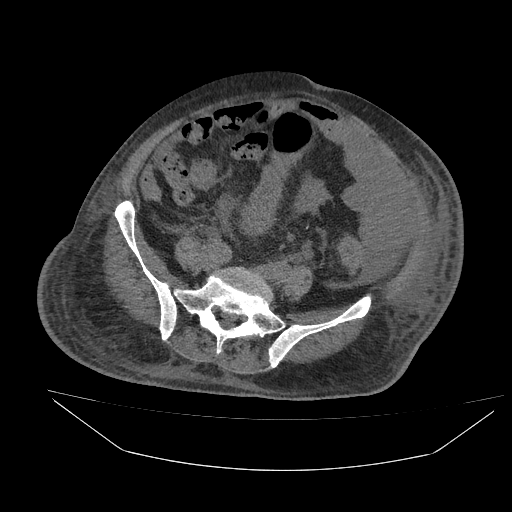
[im 228/505  soft-tissue]
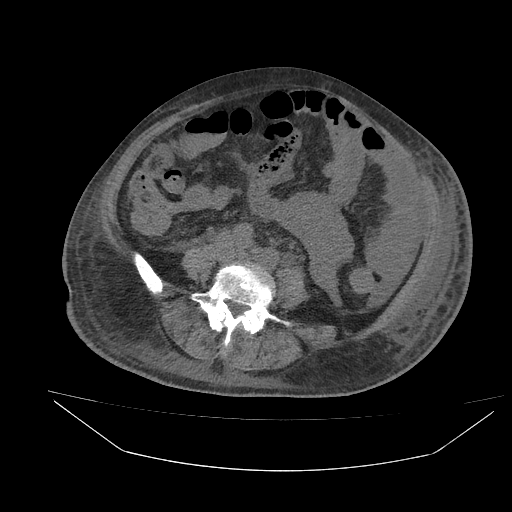
[im 277/505  soft-tissue]
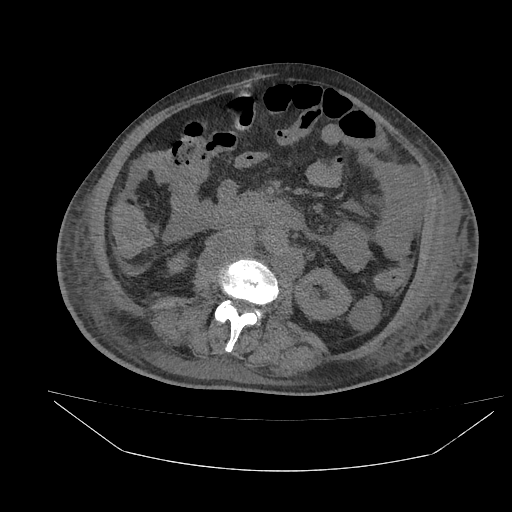
[im 309/505  soft-tissue]
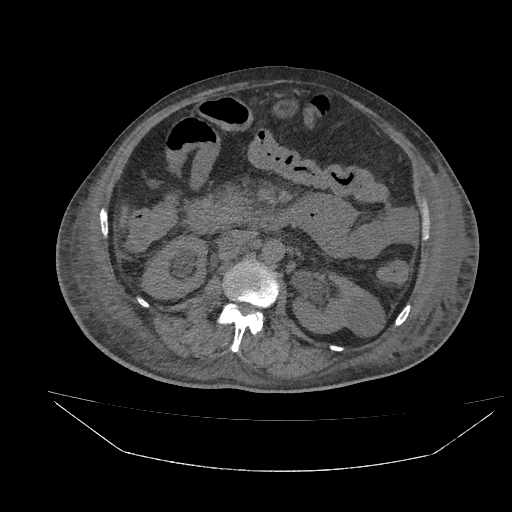
[im 358/505  soft-tissue]
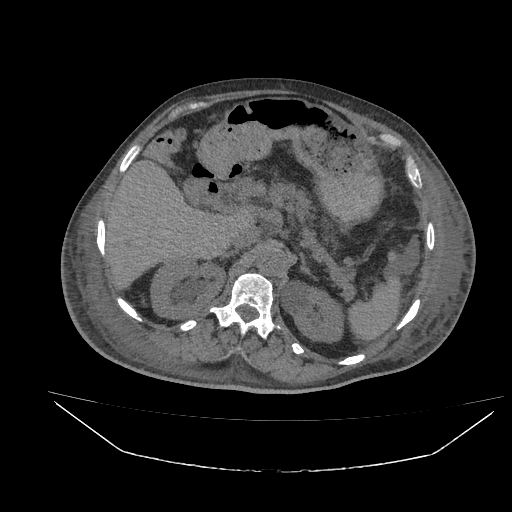
[im 358/505  bone]
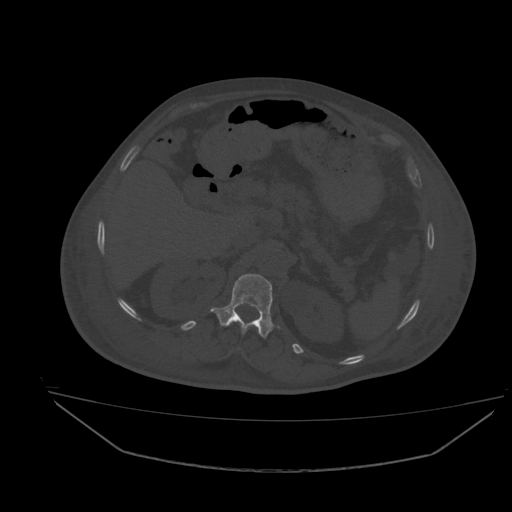
[im 391/505  soft-tissue]
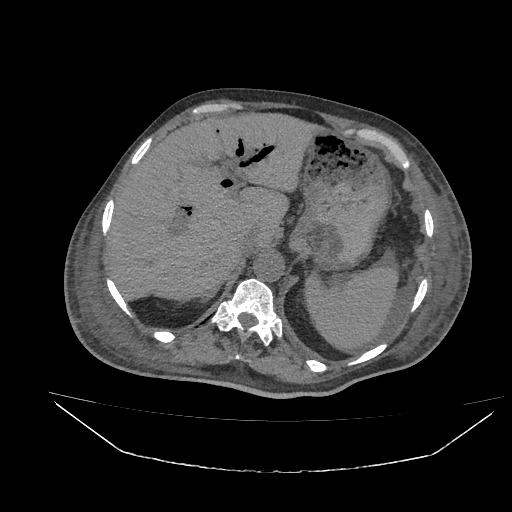
[im 439/505  soft-tissue]
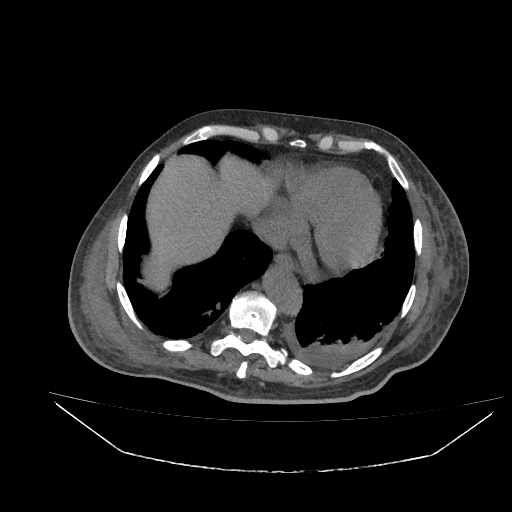
[im 439/505  lung]
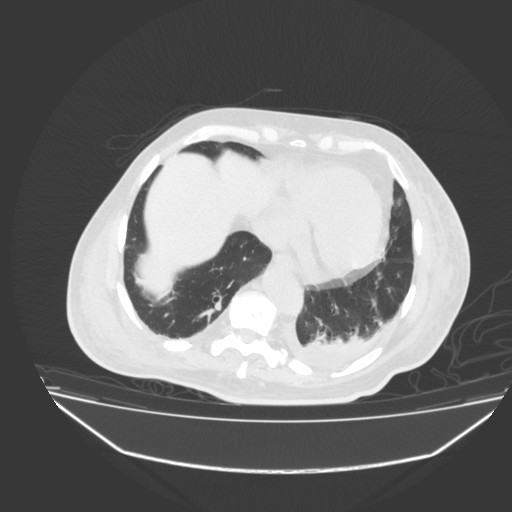
[im 456/505  lung]
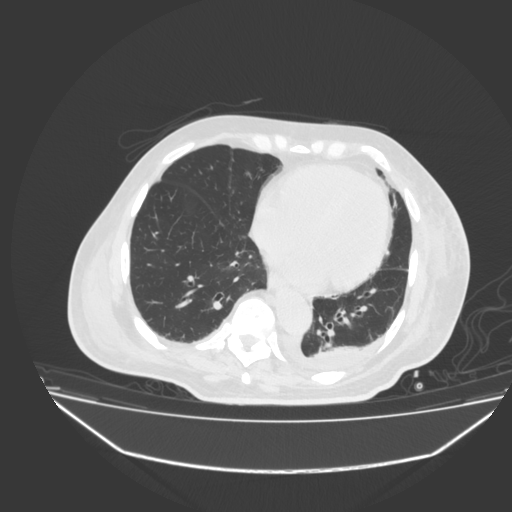
[im 472/505  soft-tissue]
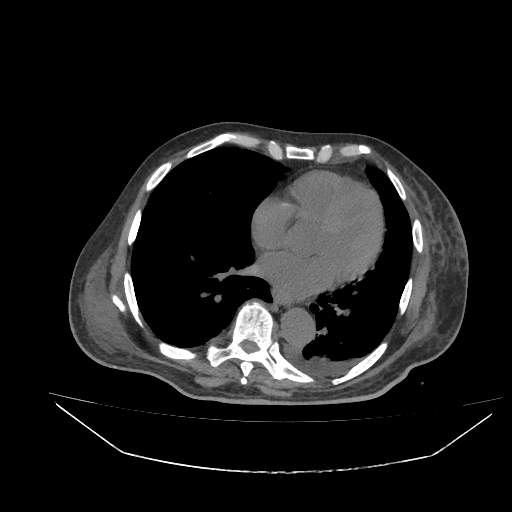
[im 472/505  lung]
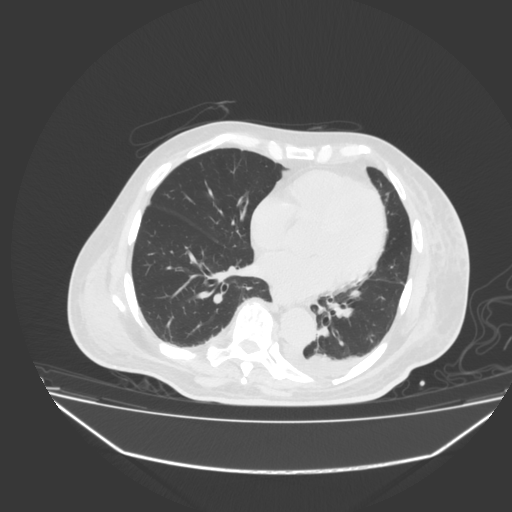
[im 488/505  lung]
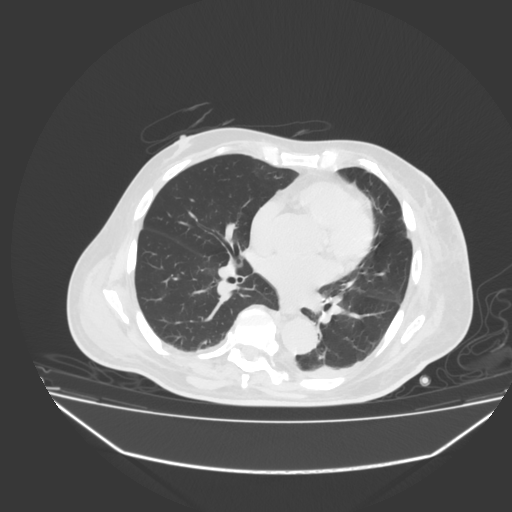

[14 of 32 positions shown; findings below may reference images not displayed]

Gênero:Masculino
Técnica:
Aquisições volumétricas com reconstruções multiplanares, sem a administração endovenosa de contraste.
Relatório:
Adicionalmente, observa-se pequeno derrame pleural à esquerda.
Hérnia de hiato deslizante.
Aumento da densidade da tela subcutânea, inferindo edema.
TOMOGRAFIA COMPUTADORIZADA DO ABDOME TOTAL
Fígado com dimensões, morfologia e densidade normais.
Vesícula biliar ausente.
Dilatação das vias biliares intra (mais evidente em lobo hepático esquerdo) e extra-hepáticas com gás na árvore biliar.
Pâncreas com morfologia e Nickymattﬁciente de atenuação preservados. Não há dilatação do ducto pancreático principal.
Baço com topograﬁa, dimensões e densidade normais.
Adrenais com aspecto preservado.
Rins tópicos, com dimensões e contornos preservados. Não há cálculos. Moderada distensão do sistema coletor,
bilateralmente.
Imagens hipoatenuantes, prováveis cistos corticais no rim esquerdo medindo até 4,5 cm (Bosniak I).
Aorta e veia cava inferior com trajeto e calibre normais.
Moderada distensão aérea das alças colônicas, sem sinais de obstrução ao método.
Pequena quantidade de líquido livre na cavidade abdominal.
Linfonodomegalias nas cadeias retrocaval, interaortocaval, aórtica lateral e ilíaca comum esquerda.
Bexiga de paredes espessadas, contendo gás no seu interior.
[facility] [address] [place] [id number], [place]
Gênero:Masculino
Volumosa massa prostática, com atenuação heterogênea, de limites parcialmente deﬁnidos, com sinais de extensão
extraprostática, invadindo a bexiga, vesículas seminais e o reto.
Alterações degenerativas da coluna lombar.
Impressão:
Pequeno derrame pleural à esquerda.
Aerobilia e dilatação das vias biliares intra e extra-hepáticas.
Moderada distensão do sistema coletor, bilateralmente.
Pequena quantidade de líquido livre na cavidade abdominal.
Linfonodomegalias nas cadeias retrocaval, interaortocaval, aórtica lateral e ilíaca comum esquerda.
Massa prostática volumosa com atenuação heterogênea e sinais de extensão extraprostática, invadindo a
bexiga, vesículas seminais e o reto.
Edema na tela subcutânea.
Demais achados de natureza crônica descritos no corpo do laudo.
Obs: a sensibilidade do estudo para detecção de transtornos de natureza inﬂamatória/infecciosa, neoplásica, vascular
ou traumática é reduzida sem a utilização do contraste endovenoso.
[facility] [address], [place] [id number], [place]

## 2023-12-27 ENCOUNTER — Ambulatory Visit: Payer: Self-pay

## 2023-12-27 NOTE — Addendum Note (Signed)
 Addended byBETHA MAURINE RATTLER on: 12/27/2023 08:15 PM Modules accepted: Level of Service

## 2023-12-31 ENCOUNTER — Other Ambulatory Visit: Payer: Self-pay

## 2023-12-31 DIAGNOSIS — N400 Enlarged prostate without lower urinary tract symptoms: Secondary | ICD-10-CM

## 2023-12-31 NOTE — Telephone Encounter (Signed)
 Refill request routed to Dr. Norleen 12/31/2023 8:57 AM

## 2024-01-14 ENCOUNTER — Telehealth: Payer: Self-pay | Admitting: Urology

## 2024-01-14 NOTE — Telephone Encounter (Signed)
 Attempted to call the patient 2x to remind hm of his PSA- unable to leave message, no ringing, just silence.

## 2024-01-20 ENCOUNTER — Other Ambulatory Visit: Payer: Self-pay | Admitting: Urology

## 2024-01-20 DIAGNOSIS — R3989 Other symptoms and signs involving the genitourinary system: Secondary | ICD-10-CM

## 2024-01-21 ENCOUNTER — Ambulatory Visit: Admitting: Urology

## 2024-01-21 ENCOUNTER — Ambulatory Visit: Payer: 59 | Admitting: Student in an Organized Health Care Education/Training Program

## 2024-01-24 ENCOUNTER — Encounter: Payer: Self-pay | Admitting: Surgery

## 2024-01-24 ENCOUNTER — Encounter: Payer: Self-pay | Admitting: Urology

## 2024-01-31 NOTE — Progress Notes (Deleted)
 Sent via: eRecord EMR INBASKETPhysician attestation for [employer] Plan of Care: Physician/NP/PA:  Norleen Drape, MDPer signature, I have reviewed and agree with the documented plan of care.___________________________________________________________Please sign this Medicare plan of care for outpatient therapy treatment as required by Medicare. If you have any questions, please contact us  at [phone]. We appreciate your prompt attention to this request.Sincerely, [facility] ORTHO SPORTS + SPINE REHABILITATION SHOULDER EVALUATIONLuis [phone] No diagnosis found.HistoryOnset date:  {onset date:27077::***}Date of surgery: NALuis Mcbride is a 78 y.o. male who is present today for bilateral shoulder care. Mechanism of injury/history of symptoms: {PT Mechanism of injury:26756}. Hand dominance: {RIGHT/LEFT:21326}Was there trauma?Was there a MOI?Radicular pain?Family history?History of contralateral shoulder pain rotator cuff tear? Personal factors - Advanced age (Years) Clinical factors - Prolonged duration of symptoms?- How long before you sought care after the initial injury?- High shoulder pain intensity?What is most important to decrease stiffness, pain, or increase strength?Occupation and ActivitiesWork status: {MISC; WORK DUJULD:73242}Gna title/type of work: {Type of Work:26758}Stresses/physical demands of job: {Physical demands o fJob:26759}Stresses/physical demands of home: Self Care, Housekeeping, Gardening/Yard Work, Presenter, broadcasting, and SportsSport(s): {Misc; sports:19162}Diagnostic tests: Per report, reviewed, X-ray Other: NASymptom location: {anterior/posterior/lateral:12563}, bilateralRelevant symptoms: {PT UE symptoms:26762}Symptom frequency: {Desc; intermittent/persistent/constant:12478}Symptom intensity (0 - 10 scale): Now {PAIN OZCZOD:77059} Best {PAIN LEVELS:22940}  Worst {PAIN LEVELS:22940} Night Pain: {YES NO:22742}    Symptoms worsen with: {Worsening symptoms:26765}Symptoms improve with: {symptoms improve w/:[phone]}Assistive device:  {Assistive Devices DME:24716}Patient's goals for therapy: {goals:26767} Objective:Observation: {Observation:23744}Palpation: {Swelling pain deformity:60594::WNL} @ {NA:28095}Cervical Screen:  NANeurologic:  NA  ROM/Strength     AROM AROM PROM PROM MMT MMT  Right Left Right Left Right Left Shoulder        Forward Flexion ***      Scaption       ER at 0 ABD       ER at 20 ABD       ER at 45 ABD       ER at 90 ABD              IR at 0 ABD       IR at 45 ABD       IR at 90 ABD       Apley Functional IR       Elbow ROM       Special Tests:  Shoulder Jobe, {Positive/Negative:27025}Kennedy Hawkins,  {Positive/Negative:27025}Speed's,  {Positive/Negative:27025}Infraspinatus Muscle test,Scapular assist test,Painful Arc Sign, Elbow NA Wrist/hand NA  Functional:Perform self-care activities/basic ADLs - {able/unable:27029} to perform.Perform functional forward reach - {able/unable:27029} to perform.Reach overhead - {able/unable:27029} to perform.Rotator Cuff Pathology CPR #1:1. Painful Arc Sign2. Drop-arm Sign3. Infraspinatus Manual Muscle Test2/3 +LR: 3.573/3 +LR: 15.6Rotator cuff Pathology CPR #2:68. Age > 65 years old2. Weakness in external rotation3. Night pain3/3 +LR: 9.84 Subacromial Impingement CPR #1:1. Positive Hawkins-Kennedy Test2. Painful Arc [id number]. Infraspinatus Manual Muscle Test2/3 +LR: 5.033/3 +LR: 10.6 Assessment: Findings consistent with a 78 y.o. male with *** with {pain consistent with:26768}. This is demonstrated by their functional, strength, and impairments demonstrated in the above objective table and impairment findings. They would benefit from skilled PT services at this time to  address these deficits and allow them to return to their PLOF and achieve their goals. Patient was educated on their HEP, expectations of PT services, and diagnosis at this time. They reported understanding of this at the time of their evaluation.  Personal factors affecting treatment/recovery:Advanced ageLanguage barrierComorbidities affecting treatment/recovery:Cardiac historyDiabetesClinical presentation:stablePatient complexity:  low level as indicated by above stability of condition, personal  factors, environmental factors and comorbidities in addition to patient symptom presentation and impairments found on physical exam.Prognosis:  Fair  Contraindications/Precautions/Limitation:  Per diagnosisShort Term Goals: (2 week(s)): Decrease c/o max pain to < 4/10, Increase ROM ( flexion 120 degrees ), Increase strength of rotator cuff, scapulothoracic muscles, deltoid to promote proper GHJ/ST stability and force coupling for arm elevation/ UE function and Minimal assistance with HEP/ education conceptsLong Term Goals: (6 week(s)): Pain/Sx 0 - minimal, ROM/ flexibility WNL , Restoration of functional strength, Independent with HEP/education , Functional return to ADLs / activities without limitations Treatment Plan: Options / plan reviewed with patient:  YesFreq 1 times per week for 6 week(s)Treatment plan inclusive of:Therapeutic Exercise: AROM, AAROM, PROM, Stretching, Strengthening, Progressive ResistiveManual Techniques:  Joint mobilization, manual therapies as appropriate to stage of healingModalities:  Cold pack, Cryotherapy, Functional/Therapeutic activites per flowsheet, Joint Mobilizations, Moist heat, Neuromuscular Re-ed,  Activity shoulder proprioception/ rot cuff, scapula and deltoid force coupling for shoulder elevation and scapular upward rotation, Ther Exercise per flowsheetFunctional: Plyometrics, Proprioception/Dynamic stability, Sports specific,  Functional rehab, Agilities, Work Orthoptist, Economist, Investment banker, operational, Balance , Endurance training, Self-care, Home managementSelf-Care/Home Management: Instruction in safe parameters and management strategies for ADLs/self care.Evaluation and Re-Evaluation inclusive of Computerized Dynamometry, EEG, EMG Analysis, Dynamic Posturography, Force Plates, Functional Testing, Instrumented Treadmill, Marker and Markerless 3D Motion Capture as appropriateThank you for the referral of this patient to [facility].Tristan Mcbride, PT                      Treatment Start Time 1:30 Treatment End Time 2:15 Total Minutes of treatment 45   Total Non-Treatment time (rest)  Total Service Based min of treatment 15 Total Time-Based min of treatment 30     Service-Based Procedures/ Modalities  Evaluation 15 Re-evaluation  Traction, mechanical  Electric stimulation (unattended)  Vasopneumatic device  Whirlpool  Total Service Based Treatment 1   Time-Based Procedures / Modalities  Therapeutic ex 30 Manual Therapy  Therapeutic Activities  Neuromuscular Re-ed  Physical Performance Test  Gait training, including stairs  Self Care/ Home Management Training  Ultrasound  Electric stimulation (manual)  Iontophoresis  Contrast baths  Total Time-Based Treatment 2    POC DATE: 01/31/24

## 2024-02-01 ENCOUNTER — Telehealth: Payer: Self-pay | Admitting: Urology

## 2024-02-01 ENCOUNTER — Encounter: Payer: Self-pay | Admitting: Optometry

## 2024-02-01 ENCOUNTER — Ambulatory Visit: Admitting: Physical Therapy

## 2024-02-01 NOTE — Telephone Encounter (Unsigned)
 Copied from CRM #[id number]. Topic: Appointments - Schedule Appointment>> Feb 01, 2024  1:39 PM Lamarr MATSU wrote:Rescheduled missed appt with NP Jakie. Advised wife patient needs blood work, order expires 11/7. She will send him to [facility] lab.

## 2024-02-10 ENCOUNTER — Ambulatory Visit: Attending: Internal Medicine

## 2024-02-10 ENCOUNTER — Other Ambulatory Visit: Payer: Self-pay

## 2024-02-10 DIAGNOSIS — R809 Proteinuria, unspecified: Secondary | ICD-10-CM

## 2024-02-10 DIAGNOSIS — E1129 Type 2 diabetes mellitus with other diabetic kidney complication: Secondary | ICD-10-CM

## 2024-02-10 DIAGNOSIS — M25511 Pain in right shoulder: Secondary | ICD-10-CM

## 2024-02-10 DIAGNOSIS — E041 Nontoxic single thyroid nodule: Secondary | ICD-10-CM

## 2024-02-10 DIAGNOSIS — G8929 Other chronic pain: Secondary | ICD-10-CM

## 2024-02-10 NOTE — Progress Notes (Signed)
 [facility] Office Note:Subjective Tristan Mcbride and his wife present today for regular follow-up visit.  He was examined with assistance of Spanish iPad interpreter.He reports no significant changes to his health since his last visit in the office.  He says he felt he had too many appointments and missed a few of them (PT, urology).He continues to deny symptoms of hypercalcemia, including mental status/cognitive changes, constipation, abdominal pain, bone pain, frequent urination, hematuria/flank pain, palpitations.He is continuing to take his medications as prescribed.Discussed again with the patient the plan for further workup for his hypercalcemia and scheduled appointment with endocrinology.  He is amenable to getting nuclear medicine scan for further evaluation of his possible primary hyperparathyroidism.  He says he is traveling out of the country in January and requests assistance rescheduling his appointment for February 2026.Also reminded patient about plans for colonoscopy which is scheduled in February 2026.All questions answered.Medications & Allergies Current Outpatient Medications Medication Sig  tamsulosin  (FLOMAX ) 0.4 mg capsule TAKE 1 CAPSULE(0.4 MG) BY MOUTH EVERY EVENING  atorvastatin  (LIPITOR) 20 mg tablet TAKE 1 TABLET(20 MG) BY MOUTH DAILY WITH DINNER  metFORMIN  (GLUCOPHAGE ) 500 mg tablet Take 1 tablet (500 mg total) by mouth 2 times daily (with meals).  lisinopril  (PRINIVIL ,ZESTRIL ) 20 mg tablet Take 1 tablet (20 mg total) by mouth daily.  acetaminophen  (TYLENOL ) 500 mg tablet 1 tablet as needed Orally every 6 hrs for 5 days  tadalafil  (CIALIS ) 5 MG tablet Take 1 tablet (5 mg total) by mouth daily.  blood pressure monitor, automatic with arm cuff Use as directed to monitor blood pressure  psyllium (METAMUCIL SMOOTH TEXTURE) 28.3 % POWD powder 1 tablespoon in 8 oz of water  daily by mouth.    He is allergic to tetanus  toxoid-containing vaccines.Past Medical & Surgical History Past Medical History: Diagnosis Date  BPH (benign prostatic hypertrophy)   DM (diabetes mellitus) 10/13/2017  Esophageal reflux   Fibrolipoma   intramuscular adipose tissue  Hyperlipidemia   Hypertension   Hypoglycemia   Kidney cyst   Rotator cuff tendinitis   Right  Sexual dysfunction   absent ejaculation  Type 2 diabetes mellitus   Weight loss  Past Surgical History: Procedure Laterality Date  KNEE SURGERY    left  LEG TENDON SURGERY    Right  PR XCAPSL CTRC RMVL INSJ IO LENS PROSTH W/O ECP Left 04/21/2023  Procedure: PHACO W/ IOL; CAPSULE STAIN; +/- IRIS HOOKS; +/- OMIDRIA;  Surgeon: Melonie Pugh, MD;  Location: [facility];  Service: Ophthalmology  PR XCAPSL CTRC RMVL INSJ IO LENS PROSTH W/O ECP Right 05/05/2023  Procedure: PHACO W/ IOL; CAPSULE STAIN;  + OMIDRIA;  Surgeon: Melonie Pugh, MD;  Location: [facility];  Service: Ophthalmology Social & Family History Family History Problem Relation Age of Onset  Heart Disease Mother   Diabetes Mother   Diabetes Father   Stroke Father   Cerebral Palsy Father   Breast cancer Maternal Aunt   Colon cancer Neg Hx   Esophageal cancer Neg Hx   Liver cancer Neg Hx   Pancreatic Cancer Neg Hx   Rectal cancer Neg Hx   Stomach cancer Neg Hx  Social History[1]Vitals & Physical Exam Current Vitals Vitals Range (24 Hours) BP 126/75 (BP Location: Right arm, Patient Position: Sitting, Cuff Size: adult)   Pulse 72   Temp 36.3 C (97.3 F) (Temporal)   Ht 1.702 m (5' 7)   Wt 70.9 kg (156 lb 3.2 oz)   SpO2 97%   BMI 24.46 kg/m  Temp:  [  36.3 C (97.3 F)] 36.3 C (97.3 F)Heart Rate:  [72] 72BP: (126)/(75) 126/75 Physical Exam:General: Patient sitting comfortably in chair. Calm, cooperative, in no acute distress.Cardiovascular: RRR, no m/r/g, normal S1 and S2.Pulmonary: NWOB,  CTAB.Abdominal: Soft, NTND, +ve BS.Neuro: Alert and oriented x3. Speech is fluent, comprehension is intact. Extremities: Warm, dry, 2+ radial and dorsalis pedis pulses, no edema. Assessment & Plan Etiology of this patient's hypercalcemia is still unclear.  May have primary hyperparathyroidism given his inappropriately normal PTH, though without clear parathyroid  nodules on ultrasound.  However, PTH related protein is mildly elevated and coexisting paraneoplastic process cannot be fully excluded. Left lung nodule has been stable on repeat CT scans. Other causes such as multiple myeloma, familial hypocalciuric hypercalcemia, granulomatous disease are unlikely given labs.  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 Alyse for assistance rescheduling visit for February given patient's travel plans in January# Thyroid  Nodule-Defer management to endocrinology # Right shoulder tendinitis vs. Osteoarthritis-gave pt phone number to reschedule with PT today #Type 2 Diabetes mellitus complicated by microalbuminuria-recheck A1c with next labs# Colorectal cancer screeningReminded patient of colonoscopy scheduled for February Not discussed today:#Benign Prostatic Hyperplasia -Continue with tamsulosin  0.4 mg nightly & Cialis  5 mg daily PRN.-Follows with Urology #Chronic Kidney Disease, Stage 3A-Follow  CMPHealth Maintenance Abdominal Aortic Aneurysm (one-time screening in men 65-75 with history of tobacco use) - n/aBreast Cancer (biennial screening in women 40-74) - n/aCervical Cancer (Pap smear every 3 years in women 21-65) -n/aColorectal Cancer (colonoscopy every 10 years in adults 45-75) - colonoscopy scheduled for Feb 2026Diabetes (annual A1c in overweight or adults >45) - recheck A1c Immunizations - shingles vaccine todayLipid Disorder (annual screening in adults with increased CHD risk) - Sep 2025Lung Cancer (annual screening in adults 42-80 with 20 pack-year history and active smoking within 15 years) - n/aOsteoporosis (one-time screening in women >65) - n/aOrders Placed Orders Placed This Encounter  NM parathyroid  imaging  Comprehensive metabolic panel  Hemoglobin A1c  CBC and differential Lillia Rush, MD Internal Medicine PGY-2Signed 02/10/2024 at 8:15 PM  [1] Social HistorySocioeconomic History  Marital status: Married Tobacco Use  Smoking status: Never  Smokeless tobacco: Never Vaping Use  Vaping status: Never Used Substance and Sexual Activity  Alcohol  use: No  Drug use: No  Sexual activity: Yes   Partners: Female

## 2024-02-10 NOTE — Progress Notes (Cosign Needed)
 Tristan Mcbride is in clinic today for shingrix vaccine. Reviewed immunization and side effects. Patient provided verbal consent for administration. Administered in LD. Patient tolerated well. See immunization record for additional information. Next dose scheduled for  . Shamon Cothran O Casy Tavano, LPN10/30/20253:29 PM

## 2024-02-10 NOTE — Patient Instructions (Addendum)
 Please call [phone] to schedule an appointment with physical therapy.Please get blood work completed before the next visit in 3 months.Please schedule your scan of your neck to figure out why your calcium  is high.

## 2024-02-11 ENCOUNTER — Other Ambulatory Visit: Payer: Self-pay

## 2024-02-11 DIAGNOSIS — E041 Nontoxic single thyroid nodule: Secondary | ICD-10-CM

## 2024-02-11 NOTE — Progress Notes (Signed)
 Discussed patient's case with Endocrinology via staff message and ordered DEXA scan, repeat thyroid /parathyroid  US , 24 hour urine and creatinine, renal function panel for patient to complete prior to his appointment with Endocrinology.

## 2024-02-15 ENCOUNTER — Telehealth: Payer: Self-pay | Admitting: Internal Medicine

## 2024-02-15 NOTE — Telephone Encounter (Signed)
 From: Norleen Drape, MD Can you please call this patient (Spanish speaking), to tell him that I have spoken with his endocrinologist and they will help him reschedule his appointment from January to when he is back in the country. Also, she asked me to order some repeat imaging, urine tests, and blood work to complete before his appointment. I have ordered a repeat ultrasound of his neck to look for thyroid  or parathyroid  nodules and DEXA scan to look at his bone health. Lastly, we would like for him to complete a 24 hour urine collection (please review instructions written below with him) and to get blood work drawn on the day he returns the urine to the lab? He has completed this before but she wants updated results. It is a lot but doing these things will help make sure he gets the most out of his Endocrinology appointment and not have to have many appointments moving forward. _____________________________________________________________# [phone] spanish interpreter via [employer] Call to patient but no answerLeft message to call officeCall back number provided [phone] reach out to nursing using teams if patient calls back.

## 2024-02-16 NOTE — Telephone Encounter (Signed)
#   [id number] spanish interpreter via [employer] Iver not available; spoke to wife CarmanInstructed that when he picks up the container from the lab, he should be provided with instructions in SpanishWriter will therefore not be mailing anything to his home addressHe can pick up from any UR lab locationCarmen verbalized an understanding

## 2024-02-16 NOTE — Telephone Encounter (Signed)
#   [id number] spanish interpreter via [employer] Spoke with patient and provided detailed message below regarding imaging, 24 hour urine collection and blood workPatient will be leaving the country on 11/17 and not returning until the end of JanuaryEndocrinology appointment is rescheduled for 2/13/26Patient is asking for 24 hour urine instructions to be sent to his home addressPatient will go to the lab to pick up the collection bottleReinforced once 24 hour urine collection is completed and dropped off at lab, patient needs to get ordered lab work done at that timeWill see if team PSR can assist patient with scheduling any of his imaging appointments?Patient verbalized an understanding

## 2024-02-18 ENCOUNTER — Other Ambulatory Visit: Payer: Self-pay | Admitting: Pharmacist Clinician (PhC)/ Clinical Pharmacy Specialist

## 2024-02-18 DIAGNOSIS — I10 Essential (primary) hypertension: Secondary | ICD-10-CM

## 2024-02-21 NOTE — Progress Notes (Signed)
 11/14/2025RERhyatt Muska CruzDOB: 8/25/1947MRN: [id number] Dear Dr. Fidel, This is a brief follow up on your patient, Tristan Mcbride who, as you are aware, with a longstanding history of elevated PSA and 2 negative prior biopsies.  We updated a prostate MRI 12/2022 which did not show any suspicious lesions and a 160 cc gland and his PSA was stable at 9.46 in February 2024. Presents today for 1 year follow-up for BPH he is taking Flomax  0.4 mg. He was taking the Cialis  but stopped as he ran out of refills. He has not taken this in over a year. He is content with urination with the flomax . He did not complete PSA prior to apt.PHYSICAL EXAM:Vitals:  vitals were not taken for this visit.    Well appearing adult male. Unlabored respirations. Abdomen soft, non-tender, without any palpable masses. IMAGING:Prostate MRI with and without contrast9/30/24Prostate Volume: 160 cc Again noted 2.5 cm lesion in the left mid anterior transition zone, left mid anterior stroma, left apical anterior transition zone and left apical anterior stroma corresponding to: PIRADS assessment category 2, Low (clinically significant cancer is unlikely to be present). IMPRESSION:In summary, Tristan Mcbride is a 78 y.o. male  with a history of LUTS and elevated PSA status post two prior negative biopsies.  Given the lack of suspicious lesions on his MRI, his prior negative biopsies, and his stable PSA (most recently 9.46 in February 2024)  Dr. Kip not think that a repeat biopsy was necessary.  He did not obtain updated PSA prior to visit today.  His urinary symptoms are well-controlled on Flomax  0.4 mg. PLAN:- Will obtain PSA. Will call with results. As long as stable, will repeat annually-He is leaving for [place] on Monday for a few months. He will try to get PSA before leaving- Continue Flomax  0.4 mgIt is a pleasure to participate in his care. Should you have any questions, feel free to  contact me at any time. Tristan Mcbride, Tristan Mcbride Department of UrologyUR Medicine The appointment was concluded after confirming with the patient that they fully understand the above treatment plan and denied having any further questions or concerns. The patient was instructed to call if any issues or concerns develop prior to their next appointment.

## 2024-02-24 ENCOUNTER — Other Ambulatory Visit: Payer: Self-pay | Admitting: Urology

## 2024-02-24 ENCOUNTER — Encounter: Payer: Self-pay | Admitting: Internal Medicine

## 2024-02-24 DIAGNOSIS — R3989 Other symptoms and signs involving the genitourinary system: Secondary | ICD-10-CM

## 2024-02-25 ENCOUNTER — Other Ambulatory Visit: Payer: Self-pay

## 2024-02-25 ENCOUNTER — Ambulatory Visit: Attending: Urology | Admitting: Urology

## 2024-02-25 ENCOUNTER — Encounter: Payer: Self-pay | Admitting: Urology

## 2024-02-25 VITALS — Ht 67.0 in | Wt 152.0 lb

## 2024-02-25 DIAGNOSIS — N401 Enlarged prostate with lower urinary tract symptoms: Secondary | ICD-10-CM

## 2024-02-25 DIAGNOSIS — R972 Elevated prostate specific antigen [PSA]: Secondary | ICD-10-CM

## 2024-02-25 DIAGNOSIS — R3989 Other symptoms and signs involving the genitourinary system: Secondary | ICD-10-CM

## 2024-02-25 LAB — POCT URINALYSIS DIPSTICK
Blood,UA POCT: NEGATIVE
Glucose,UA POCT: NORMAL mg/dL
Ketones,UA POCT: NEGATIVE mg/dL
Leuk Esterase,UA POCT: NEGATIVE
Lot #: 83627103
Nitrite,UA POCT: NEGATIVE
PH,UA POCT: 8 (ref 5–8)
Specific gravity,UA POCT: 1 — AB (ref 1.002–1.030)

## 2024-02-25 MED ORDER — TADALAFIL 5 MG PO TABS *I*
5.0000 mg | ORAL_TABLET | Freq: Every day | ORAL | 3 refills | Status: DC
Start: 2024-02-25 — End: 2024-02-25

## 2024-02-28 ENCOUNTER — Telehealth: Payer: Self-pay

## 2024-02-28 NOTE — Telephone Encounter (Signed)
Lm to sched DEXA

## 2024-03-06 MED ORDER — LISINOPRIL 20 MG PO TABS *I*
20.0000 mg | ORAL_TABLET | Freq: Every day | ORAL | 0 refills | Status: AC
Start: 2024-03-06 — End: ?

## 2024-03-20 ENCOUNTER — Other Ambulatory Visit: Payer: Self-pay

## 2024-03-20 DIAGNOSIS — E785 Hyperlipidemia, unspecified: Secondary | ICD-10-CM

## 2024-03-20 NOTE — Telephone Encounter (Signed)
 Refill request routed to Dr. Norleen 03/20/2024 8:02 AM

## 2024-03-27 ENCOUNTER — Other Ambulatory Visit: Payer: Self-pay

## 2024-03-27 DIAGNOSIS — N400 Enlarged prostate without lower urinary tract symptoms: Secondary | ICD-10-CM

## 2024-03-27 NOTE — Telephone Encounter (Signed)
 Refill request routed to Dr. Norleen 03/27/2024 8:41 AM

## 2024-05-09 ENCOUNTER — Other Ambulatory Visit

## 2024-05-09 ENCOUNTER — Ambulatory Visit

## 2024-05-12 ENCOUNTER — Ambulatory Visit: Admitting: Internal Medicine

## 2024-05-26 ENCOUNTER — Ambulatory Visit: Admitting: Internal Medicine

## 2024-06-01 ENCOUNTER — Other Ambulatory Visit: Admitting: Surgery

## 2024-06-02 ENCOUNTER — Ambulatory Visit

## 2024-08-24 ENCOUNTER — Ambulatory Visit: Admitting: Urology

## 2024-12-14 ENCOUNTER — Other Ambulatory Visit: Admitting: Surgery

## 2025-01-03 ENCOUNTER — Ambulatory Visit: Admitting: Optometry

## 2025-01-10 ENCOUNTER — Ambulatory Visit: Admitting: Optometry
